# Patient Record
Sex: Female | Born: 1935 | Race: White | Hispanic: No | Smoking: Former smoker
Health system: Southern US, Community
[De-identification: ages and names within clinical notes are randomized; demographics above are authoritative.]

## PROBLEM LIST (undated history)

## (undated) DIAGNOSIS — C801 Malignant (primary) neoplasm, unspecified: Secondary | ICD-10-CM

## (undated) DIAGNOSIS — M810 Age-related osteoporosis without current pathological fracture: Secondary | ICD-10-CM

## (undated) DIAGNOSIS — E0500 Thyrotoxicosis with diffuse goiter without thyrotoxic crisis or storm: Secondary | ICD-10-CM

## (undated) DIAGNOSIS — I10 Essential (primary) hypertension: Secondary | ICD-10-CM

## (undated) DIAGNOSIS — E079 Disorder of thyroid, unspecified: Secondary | ICD-10-CM

## (undated) HISTORY — DX: Malignant (primary) neoplasm, unspecified: C80.1

## (undated) HISTORY — PX: BACK SURGERY: SHX140

## (undated) HISTORY — PX: HAND SURGERY: SHX662

## (undated) HISTORY — PX: BREAST LUMPECTOMY: SHX2

## (undated) HISTORY — PX: ROTATOR CUFF REPAIR: SHX139

## (undated) HISTORY — PX: ELBOW SURGERY: SHX618

## (undated) HISTORY — PX: JOINT REPLACEMENT: SHX530

---

## 2005-05-08 ENCOUNTER — Ambulatory Visit: Payer: Self-pay | Admitting: Specialist

## 2005-05-22 ENCOUNTER — Inpatient Hospital Stay (HOSPITAL_COMMUNITY): Admission: RE | Admit: 2005-05-22 | Discharge: 2005-05-26 | Payer: Self-pay | Admitting: Neurosurgery

## 2005-07-23 ENCOUNTER — Encounter: Admission: RE | Admit: 2005-07-23 | Discharge: 2005-07-23 | Payer: Self-pay | Admitting: Neurosurgery

## 2006-02-27 ENCOUNTER — Inpatient Hospital Stay: Payer: Self-pay | Admitting: Specialist

## 2006-03-06 ENCOUNTER — Encounter: Payer: Self-pay | Admitting: Internal Medicine

## 2006-03-08 ENCOUNTER — Encounter: Payer: Self-pay | Admitting: Internal Medicine

## 2006-03-26 ENCOUNTER — Encounter: Payer: Self-pay | Admitting: Internal Medicine

## 2006-04-08 ENCOUNTER — Encounter: Payer: Self-pay | Admitting: Internal Medicine

## 2006-05-08 ENCOUNTER — Encounter: Payer: Self-pay | Admitting: Internal Medicine

## 2007-06-08 ENCOUNTER — Other Ambulatory Visit: Payer: Self-pay

## 2007-06-08 ENCOUNTER — Inpatient Hospital Stay: Payer: Self-pay | Admitting: Internal Medicine

## 2009-10-17 ENCOUNTER — Emergency Department: Payer: Self-pay | Admitting: Unknown Physician Specialty

## 2010-09-07 DIAGNOSIS — M48062 Spinal stenosis, lumbar region with neurogenic claudication: Secondary | ICD-10-CM | POA: Insufficient documentation

## 2011-06-20 ENCOUNTER — Emergency Department (HOSPITAL_COMMUNITY)
Admission: EM | Admit: 2011-06-20 | Discharge: 2011-06-20 | Disposition: A | Discharge status: Home / Self Care | Payer: Medicare Other | Attending: Emergency Medicine | Admitting: Emergency Medicine

## 2011-06-20 ENCOUNTER — Emergency Department (HOSPITAL_COMMUNITY): Payer: Medicare Other

## 2011-06-20 ENCOUNTER — Encounter (HOSPITAL_COMMUNITY): Payer: Self-pay | Admitting: Emergency Medicine

## 2011-06-20 DIAGNOSIS — Z79899 Other long term (current) drug therapy: Secondary | ICD-10-CM | POA: Insufficient documentation

## 2011-06-20 DIAGNOSIS — R209 Unspecified disturbances of skin sensation: Secondary | ICD-10-CM | POA: Insufficient documentation

## 2011-06-20 DIAGNOSIS — M51369 Other intervertebral disc degeneration, lumbar region without mention of lumbar back pain or lower extremity pain: Secondary | ICD-10-CM

## 2011-06-20 DIAGNOSIS — M48061 Spinal stenosis, lumbar region without neurogenic claudication: Secondary | ICD-10-CM

## 2011-06-20 DIAGNOSIS — M810 Age-related osteoporosis without current pathological fracture: Secondary | ICD-10-CM | POA: Insufficient documentation

## 2011-06-20 DIAGNOSIS — M545 Low back pain, unspecified: Secondary | ICD-10-CM

## 2011-06-20 DIAGNOSIS — E079 Disorder of thyroid, unspecified: Secondary | ICD-10-CM | POA: Insufficient documentation

## 2011-06-20 DIAGNOSIS — I10 Essential (primary) hypertension: Secondary | ICD-10-CM | POA: Insufficient documentation

## 2011-06-20 DIAGNOSIS — M5126 Other intervertebral disc displacement, lumbar region: Secondary | ICD-10-CM

## 2011-06-20 HISTORY — DX: Thyrotoxicosis with diffuse goiter without thyrotoxic crisis or storm: E05.00

## 2011-06-20 HISTORY — DX: Essential (primary) hypertension: I10

## 2011-06-20 HISTORY — DX: Age-related osteoporosis without current pathological fracture: M81.0

## 2011-06-20 HISTORY — DX: Disorder of thyroid, unspecified: E07.9

## 2011-06-20 MED ORDER — KETOROLAC TROMETHAMINE 30 MG/ML IJ SOLN
30.0000 mg | Freq: Once | INTRAMUSCULAR | Status: AC
  Administered 2011-06-20: 30 mg via INTRAVENOUS
  Filled 2011-06-20: qty 1

## 2011-06-20 MED ORDER — DIAZEPAM 5 MG PO TABS
5.0000 mg | ORAL_TABLET | Freq: Once | ORAL | Status: AC
  Administered 2011-06-20: 5 mg via ORAL
  Filled 2011-06-20: qty 1

## 2011-06-20 MED ORDER — HYDROMORPHONE HCL PF 1 MG/ML IJ SOLN
0.5000 mg | Freq: Once | INTRAMUSCULAR | Status: AC
  Administered 2011-06-20: 0.5 mg via INTRAVENOUS
  Filled 2011-06-20: qty 1

## 2011-06-20 MED ORDER — PERCOCET 5-325 MG PO TABS: 1.0000 | ORAL_TABLET | ORAL | Status: AC | PRN

## 2011-06-20 MED ORDER — HYDROMORPHONE HCL PF 1 MG/ML IJ SOLN
1.0000 mg | Freq: Once | INTRAMUSCULAR | Status: AC
  Administered 2011-06-20: 1 mg via INTRAVENOUS
  Filled 2011-06-20: qty 1

## 2011-06-20 MED ORDER — DIAZEPAM 5 MG PO TABS: 5.0000 mg | ORAL_TABLET | Freq: Three times a day (TID) | ORAL | Status: AC | PRN

## 2011-06-20 MED ORDER — ONDANSETRON HCL 4 MG/2ML IJ SOLN
4.0000 mg | Freq: Once | INTRAMUSCULAR | Status: AC
  Administered 2011-06-20: 4 mg via INTRAVENOUS
  Filled 2011-06-20: qty 2

## 2011-06-20 NOTE — ED Provider Notes (Signed)
I was the primary provider of this patient during this ER visit. The patients care was continued in the CDU and managed in conjunction with my midlevel providers   [name], MD 06/20/11 2018

## 2011-06-20 NOTE — ED Notes (Signed)
Pt reports, "I hurt my back." Pt reports hurt her back by bending over 2 weeks ago. Pt c/o numbness to b/l legs. Denies incontinence of bowel or urine.

## 2011-06-20 NOTE — ED Provider Notes (Signed)
4:41 PM Pt placed in CDU holding for MRI L-spine. Sign out received from Dr Patria Mane.  Pt  Patient reports her back pain is much improved, now just an "ache."  Discussed MRI results with patient and patient's sister.  Patient has been taking OTC pain medications at home without improvement.  Has taken oxycodone at home with relief previously - states hydrocodone does not work for her.  Plan is to ambulate in hallway, d/c home if tolerating.  Pt has cane here, also has walker at home if needed.  Will also d/c with valium.    6:26 PM Patient ambulated in hallway with some pain but comfortable enough to go home.  She used her cane here but has her walker to use at home.  Pt to be d/c home with f/u with Dr Jordan Likes, neurosurgery, with percocet, valium.  Return precautions given.  Pt advised to be cautious with pain medications given fall risk. Patient verbalizes understanding and agrees with plan.     No results found for this or any previous visit. Mr Lumbar Spine Wo Contrast  06/20/2011  *RADIOLOGY REPORT*  Clinical Data: Low back pain and bilateral leg pain.  Numbness in the left foot.  The left leg weakness.  Previous lumbar surgeries.  MRI LUMBAR SPINE WITHOUT CONTRAST  Technique:  Multiplanar and multiecho pulse sequences of the lumbar spine were obtained without intravenous contrast.  Comparison: Lumbar radiographs dated 07/23/2005  Findings: Scan extends from T11-12 through S3.  Tip of the conus is at L2-3 and appears normal.  There is a tiny bulge of the mid abdominal aorta to a maximal diameter of 2.5 cm.  Paraspinal soft tissues are otherwise normal.  T11-12 through L1-2:  There is disc space narrowing with small broad-based disc bulges without neural impingement or significant spinal stenosis.  L2-3:  There is a small broad-based disc bulge with accompanying osteophyte formation.  Hypertrophied ligamenta flava and facet joints compress the lateral recesses and the sides of the thecal sac causing mild spinal  stenosis.  There is also right foraminal stenosis.  L3-4:  Solid interbody fusion with posterior decompression with no residual impingement.  Slight scarring around the right side of the thecal sac and the right L3 nerve root sleeve.  L4-5:  The disc is normal.  There is marked hypertrophy of the ligamenta flava and facet joints with posterior decompression surgery.  Moderate bilateral foraminal stenosis and compression of the right and left sides of the thecal sac.  No focal neural impingement.  L5-S1:  There is a central annular tear and a tiny central subligamentous disc protrusion with bulging of the remainder of the disc without focal neural impingement.  Moderate hypertrophy of the ligamenta flava and facet joints.  There has been posterior decompression.  IMPRESSION:  1.  No acute abnormalities of the lumbar spine. 2.  Narrowing of the transverse dimension of the spinal canal L2-3, L4-5 and L5 S1 due to hypertrophy of the facet joints. 3.  Posterior decompression surgery from L2-3 through L5-S1 with no residual impingement. 3.  Solid fusion at L3-4 with slight scarring around the right side of the thecal sac and the right L3 nerve root sleeve.  Original Report Authenticated By: Gwynn Burly, M.D.      Dillard Cannon [place], [place] 06/20/11 1914

## 2011-06-20 NOTE — ED Provider Notes (Signed)
History  Scribed for Cynthia Co, MD, the patient was seen in room STRE2/STRE2. This chart was scribed by Cynthia Raymond. The patient's care started at 1:07 PM   CSN: [id number]  Arrival date & time 06/20/11  1244   First MD Initiated Contact with Patient 06/20/11 1257      Chief Complaint  Patient presents with  . Back Injury     The history is provided by the patient.   Cynthia Raymond is a 77 y.o. female who presents to the Emergency Department complaining of back pain that started about two weeks ago after experiencing sharp jabbing pains while bent over, squatting, planting flowers.  The back pain got worse soon after radiating down both legs.  She states that for several days she experienced numbness of the left foot.  She reports that the pain briefly subsided, but has since gotten worse after changing the sheets on the bed about one week ago.  She has had difficulty moving and has used a cane.  She has taken ibuprofen tylenol with no relief.  She denies trouble with urination or dysuria.  She states that the left leg feels weak.  Pt has h/o spinal stenosis and has had two surgeries with the last surgery five to six years ago.  She has foot drop and previous knee surgery of the left leg.    Past Medical History  Diagnosis Date  . Hypertension   . Thyroid disease   . Graves disease   . Osteoporosis     Past Surgical History  Procedure Date  . Breast lumpectomy     History reviewed. No pertinent family history.  History  Substance Use Topics  . Smoking status: Former Games developer  . Smokeless tobacco: Not on file   Comment: Quit 1989  . Alcohol Use: No    OB History    Grav Para Term Preterm Abortions TAB SAB Ect Mult Living                  Review of Systems  All other systems reviewed and are negative.    Allergies  Amitriptyline; Penicillins; and Sulfa antibiotics  Home Medications   Current Outpatient Rx  Name Route Sig Dispense Refill  .  AMLODIPINE BESYLATE 5 MG PO TABS Oral Take 5 mg by mouth daily.    Marland Kitchen VITAMIN D PO Oral Take 2 capsules by mouth daily.    Marland Kitchen HYDROCHLOROTHIAZIDE 25 MG PO TABS Oral Take 25 mg by mouth daily.    Marland Kitchen RAMIPRIL 10 MG PO CAPS Oral Take 10 mg by mouth daily.    . SERTRALINE HCL 50 MG PO TABS Oral Take 100 mg by mouth daily.      BP 138/71  Pulse 71  Temp 98 F (36.7 C) (Oral)  Resp 18  SpO2 94%  Physical Exam  Nursing note and vitals reviewed. Constitutional: She is oriented to person, place, and time. She appears well-developed and well-nourished. No distress.  HENT:  Head: Normocephalic and atraumatic.  Eyes: EOM are normal.  Neck: Neck supple. No tracheal deviation present.  Cardiovascular: Normal rate.   Pulmonary/Chest: Effort normal. No respiratory distress.  Abdominal: There is no tenderness.  Musculoskeletal: Normal range of motion.       Good pulses in both feet.  Upper and lower L-spine tenderness.  Left lumbar spinal tenderness.  Mild weakness in left foot with plantar flexion, significant weakness in other major muscles of left leg.   Neurological: She is alert  and oriented to person, place, and time.  Skin: Skin is warm and dry.  Psychiatric: She has a normal mood and affect. Her behavior is normal.    ED Course  Procedures  DIAGNOSTIC STUDIES: Oxygen Saturation is 94% on room air, normal by my interpretation.    COORDINATION OF CARE:  1:19PM Ordered: MR Lumbar Spine Wo Contrast   Labs Reviewed - No data to display No results found.   No diagnosis found.    MDM  The patient has had worsening low back pain and prior L. spine surgery by Dr. pool.  Given her complaint of numbness in her legs we will move on to MRI imaging at this time.  The patient's care will be continued in the clinical decision unit  I personally performed the services described in this documentation, which was scribed in my presence. The recorded information has been reviewed and  considered.            Cynthia Co, MD 06/20/11 [phone]

## 2011-06-20 NOTE — Discharge Instructions (Signed)
Read the information below.  Please call Dr Jordan Likes for a close follow up appointment.  If you develop weakness or numbness of your legs, loss of control of your bowel or bladder, fevers, or you are unable to walk at home, return to the ER immediately for a recheck.  Please keep in mind that the pain medications prescribed to you may may you drowsy and therefore more likely to fall.  Please use caution when using these medications.  If you continue to have pain while using these, you may add and antiinflammatory medication such as ibuprofen or Aleve, but please do not take additional tylenol as this is already in Percocet and may put you at risk of overdose.  You may return to the ER at any time for worsening condition or any new symptoms that concern you.  Back Pain, Adult Low back pain is very common. About 1 in 5 people have back pain.The cause of low back pain is rarely dangerous. The pain often gets better over time.About half of people with a sudden onset of back pain feel better in just 2 weeks. About 8 in 10 people feel better by 6 weeks.  CAUSES Some common causes of back pain include:  Strain of the muscles or ligaments supporting the spine.   Wear and tear (degeneration) of the spinal discs.   Arthritis.   Direct injury to the back.  DIAGNOSIS Most of the time, the direct cause of low back pain is not known.However, back pain can be treated effectively even when the exact cause of the pain is unknown.Answering your caregiver's questions about your overall health and symptoms is one of the most accurate ways to make sure the cause of your pain is not dangerous. If your caregiver needs more information, he or she may order lab work or imaging tests (X-rays or MRIs).However, even if imaging tests show changes in your back, this usually does not require surgery. HOME CARE INSTRUCTIONS For many people, back pain returns.Since low back pain is rarely dangerous, it is often a condition that  people can learn to [facility] their own.   Remain active. It is stressful on the back to sit or stand in one place. Do not sit, drive, or stand in one place for more than 30 minutes at a time. Take short walks on level surfaces as soon as pain allows.Try to increase the length of time you walk each day.   Do not stay in bed.Resting more than 1 or 2 days can delay your recovery.   Do not avoid exercise or work.Your body is made to move.It is not dangerous to be active, even though your back may hurt.Your back will likely heal faster if you return to being active before your pain is gone.   Pay attention to your body when you bend and lift. Many people have less discomfortwhen lifting if they bend their knees, keep the load close to their bodies,and avoid twisting. Often, the most comfortable positions are those that put less stress on your recovering back.   Find a comfortable position to sleep. Use a firm mattress and lie on your side with your knees slightly bent. If you lie on your back, put a pillow under your knees.   Only take over-the-counter or prescription medicines as directed by your caregiver. Over-the-counter medicines to reduce pain and inflammation are often the most helpful.Your caregiver may prescribe muscle relaxant drugs.These medicines help dull your pain so you can more quickly return to your  normal activities and healthy exercise.   Put ice on the injured area.   Put ice in a plastic bag.   Place a towel between your skin and the bag.   Leave the ice on for 15 to 20 minutes, 3 to 4 times a day for the first 2 to 3 days. After that, ice and heat may be alternated to reduce pain and spasms.   Ask your caregiver about trying back exercises and gentle massage. This may be of some benefit.   Avoid feeling anxious or stressed.Stress increases muscle tension and can worsen back pain.It is important to recognize when you are anxious or stressed and learn ways to  manage it.Exercise is a great option.  SEEK MEDICAL CARE IF:  You have pain that is not relieved with rest or medicine.   You have pain that does not improve in 1 week.   You have new symptoms.   You are generally not feeling well.  SEEK IMMEDIATE MEDICAL CARE IF:   You have pain that radiates from your back into your legs.   You develop new bowel or bladder control problems.   You have unusual weakness or numbness in your arms or legs.   You develop nausea or vomiting.   You develop abdominal pain.   You feel faint.  Document Released: 12/24/2004 Document Revised: 12/13/2010 Document Reviewed: 05/14/2010 [facility] Patient Information 2012 [place], [place].

## 2011-06-20 NOTE — ED Notes (Signed)
PT AMBULATED IN THE HALL WITH CANE. TOLERATED WITH A FEW GRUNTS BUT WELL. PA DID OBSERVE AMBULATION

## 2011-08-14 ENCOUNTER — Emergency Department: Payer: Self-pay | Admitting: Emergency Medicine

## 2011-09-12 ENCOUNTER — Other Ambulatory Visit: Payer: Self-pay | Admitting: Neurosurgery

## 2011-09-12 DIAGNOSIS — M545 Low back pain, unspecified: Secondary | ICD-10-CM

## 2011-09-12 DIAGNOSIS — M542 Cervicalgia: Secondary | ICD-10-CM

## 2011-09-20 ENCOUNTER — Ambulatory Visit
Admission: RE | Admit: 2011-09-20 | Discharge: 2011-09-20 | Disposition: A | Discharge status: Home / Self Care | Payer: Medicare Other | Source: Ambulatory Visit | Attending: Neurosurgery | Admitting: Neurosurgery

## 2011-09-20 DIAGNOSIS — M542 Cervicalgia: Secondary | ICD-10-CM

## 2011-09-20 DIAGNOSIS — M545 Low back pain, unspecified: Secondary | ICD-10-CM

## 2011-09-20 MED ORDER — GADOBENATE DIMEGLUMINE 529 MG/ML IV SOLN
18.0000 mL | Freq: Once | INTRAVENOUS | Status: AC | PRN
  Administered 2011-09-20: 18 mL via INTRAVENOUS

## 2011-12-12 ENCOUNTER — Other Ambulatory Visit: Payer: Self-pay | Admitting: Neurosurgery

## 2011-12-12 DIAGNOSIS — M5412 Radiculopathy, cervical region: Secondary | ICD-10-CM

## 2011-12-12 DIAGNOSIS — M48061 Spinal stenosis, lumbar region without neurogenic claudication: Secondary | ICD-10-CM

## 2011-12-24 ENCOUNTER — Other Ambulatory Visit: Payer: Medicare Other

## 2012-03-16 ENCOUNTER — Emergency Department: Payer: Self-pay | Admitting: Emergency Medicine

## 2012-03-16 LAB — COMPREHENSIVE METABOLIC PANEL
Albumin: 3.5 g/dL (ref 3.4–5.0)
Anion Gap: 7 (ref 7–16)
BUN: 13 mg/dL (ref 7–18)
Chloride: 104 mmol/L (ref 98–107)
Co2: 28 mmol/L (ref 21–32)
Creatinine: 0.79 mg/dL (ref 0.60–1.30)
EGFR (Non-African Amer.): >60
Glucose: 89 mg/dL (ref 65–99)
Potassium: 3.5 mmol/L (ref 3.5–5.1)
SGOT(AST): 21 U/L (ref 15–37)
SGPT (ALT): 21 U/L (ref 12–78)
Total Protein: 7.7 g/dL (ref 6.4–8.2)

## 2012-03-16 LAB — TROPONIN I: Troponin-I: <0.02 ng/mL

## 2012-03-16 LAB — CBC
HGB: 13.9 g/dL (ref 12.0–16.0)
MCHC: 34 g/dL (ref 32.0–36.0)
RBC: 4.37 10*6/uL (ref 3.80–5.20)
RDW: 13 % (ref 11.5–14.5)
WBC: 7.1 10*3/uL (ref 3.6–11.0)

## 2012-08-14 ENCOUNTER — Emergency Department: Payer: Self-pay | Admitting: Emergency Medicine

## 2012-08-21 ENCOUNTER — Emergency Department: Payer: Self-pay | Admitting: Emergency Medicine

## 2012-09-30 ENCOUNTER — Ambulatory Visit: Payer: Self-pay | Admitting: Neurosurgery

## 2013-01-21 ENCOUNTER — Ambulatory Visit: Payer: Self-pay | Admitting: Anesthesiology

## 2013-01-22 ENCOUNTER — Other Ambulatory Visit: Payer: Self-pay | Admitting: Neurosurgery

## 2013-01-22 ENCOUNTER — Ambulatory Visit
Admission: RE | Admit: 2013-01-22 | Discharge: 2013-01-22 | Disposition: A | Discharge status: Home / Self Care | Payer: PRIVATE HEALTH INSURANCE | Source: Ambulatory Visit | Attending: Neurosurgery | Admitting: Neurosurgery

## 2013-01-22 DIAGNOSIS — M545 Low back pain, unspecified: Secondary | ICD-10-CM

## 2013-01-22 MED ORDER — GADOBENATE DIMEGLUMINE 529 MG/ML IV SOLN
20.0000 mL | Freq: Once | INTRAVENOUS | Status: AC | PRN
  Administered 2013-01-22: 20 mL via INTRAVENOUS

## 2013-01-28 ENCOUNTER — Encounter (HOSPITAL_COMMUNITY): Payer: Self-pay | Admitting: Emergency Medicine

## 2013-01-28 ENCOUNTER — Emergency Department (HOSPITAL_COMMUNITY): Payer: Medicare Other

## 2013-01-28 ENCOUNTER — Emergency Department (HOSPITAL_COMMUNITY)
Admission: EM | Admit: 2013-01-28 | Discharge: 2013-01-28 | Disposition: A | Discharge status: Home / Self Care | Payer: Medicare Other | Attending: Emergency Medicine | Admitting: Emergency Medicine

## 2013-01-28 DIAGNOSIS — R1030 Lower abdominal pain, unspecified: Secondary | ICD-10-CM

## 2013-01-28 DIAGNOSIS — Z87891 Personal history of nicotine dependence: Secondary | ICD-10-CM | POA: Insufficient documentation

## 2013-01-28 DIAGNOSIS — E079 Disorder of thyroid, unspecified: Secondary | ICD-10-CM | POA: Insufficient documentation

## 2013-01-28 DIAGNOSIS — Z880 Allergy status to penicillin: Secondary | ICD-10-CM | POA: Insufficient documentation

## 2013-01-28 DIAGNOSIS — Z8739 Personal history of other diseases of the musculoskeletal system and connective tissue: Secondary | ICD-10-CM | POA: Insufficient documentation

## 2013-01-28 DIAGNOSIS — Z7982 Long term (current) use of aspirin: Secondary | ICD-10-CM | POA: Insufficient documentation

## 2013-01-28 DIAGNOSIS — Z79899 Other long term (current) drug therapy: Secondary | ICD-10-CM | POA: Insufficient documentation

## 2013-01-28 DIAGNOSIS — Z91040 Latex allergy status: Secondary | ICD-10-CM | POA: Insufficient documentation

## 2013-01-28 DIAGNOSIS — I10 Essential (primary) hypertension: Secondary | ICD-10-CM | POA: Insufficient documentation

## 2013-01-28 DIAGNOSIS — R109 Unspecified abdominal pain: Secondary | ICD-10-CM | POA: Insufficient documentation

## 2013-01-28 LAB — CBC WITH DIFFERENTIAL/PLATELET
BASOS ABS: 0 10*3/uL (ref 0.0–0.1)
Basophils Relative: 0 % (ref 0–1)
Eosinophils Absolute: 0.1 10*3/uL (ref 0.0–0.7)
Eosinophils Relative: 2 % (ref 0–5)
HCT: 38.9 % (ref 36.0–46.0)
Hemoglobin: 13.3 g/dL (ref 12.0–15.0)
LYMPHS ABS: 1.5 10*3/uL (ref 0.7–4.0)
LYMPHS PCT: 19 % (ref 12–46)
MCH: 31.9 pg (ref 26.0–34.0)
MCHC: 34.2 g/dL (ref 30.0–36.0)
MCV: 93.3 fL (ref 78.0–100.0)
Monocytes Absolute: 1 10*3/uL (ref 0.1–1.0)
Monocytes Relative: 12 % (ref 3–12)
Neutro Abs: 5.5 10*3/uL (ref 1.7–7.7)
Neutrophils Relative %: 68 % (ref 43–77)
PLATELETS: 285 10*3/uL (ref 150–400)
RBC: 4.17 MIL/uL (ref 3.87–5.11)
RDW: 13.3 % (ref 11.5–15.5)
WBC: 8.1 10*3/uL (ref 4.0–10.5)

## 2013-01-28 LAB — COMPREHENSIVE METABOLIC PANEL
ALK PHOS: 71 U/L (ref 39–117)
ALT: 21 U/L (ref 0–35)
AST: 23 U/L (ref 0–37)
Albumin: 3.5 g/dL (ref 3.5–5.2)
BUN: 15 mg/dL (ref 6–23)
CO2: 26 meq/L (ref 19–32)
Calcium: 9.2 mg/dL (ref 8.4–10.5)
Chloride: 92 mEq/L — ABNORMAL LOW (ref 96–112)
Creatinine, Ser: 0.72 mg/dL (ref 0.50–1.10)
GFR calc non Af Amer: 78 mL/min — ABNORMAL LOW (ref >90)
GLUCOSE: 96 mg/dL (ref 70–99)
POTASSIUM: 4.5 meq/L (ref 3.7–5.3)
Sodium: 134 mEq/L — ABNORMAL LOW (ref 137–147)
Total Bilirubin: 0.4 mg/dL (ref 0.3–1.2)
Total Protein: 7.2 g/dL (ref 6.0–8.3)

## 2013-01-28 LAB — URINALYSIS, ROUTINE W REFLEX MICROSCOPIC
BILIRUBIN URINE: NEGATIVE
Glucose, UA: NEGATIVE mg/dL
HGB URINE DIPSTICK: NEGATIVE
KETONES UR: NEGATIVE mg/dL
NITRITE: NEGATIVE
PH: 7.5 (ref 5.0–8.0)
Protein, ur: NEGATIVE mg/dL
Specific Gravity, Urine: 1.016 (ref 1.005–1.030)
Urobilinogen, UA: 0.2 mg/dL (ref 0.0–1.0)

## 2013-01-28 LAB — URINE MICROSCOPIC-ADD ON

## 2013-01-28 MED ORDER — OXYCODONE-ACETAMINOPHEN 5-325 MG PO TABS: 1.0000 | ORAL_TABLET | ORAL | Status: DC | PRN

## 2013-01-28 MED ORDER — HYDROMORPHONE HCL PF 1 MG/ML IJ SOLN
1.0000 mg | Freq: Once | INTRAMUSCULAR | Status: AC
  Administered 2013-01-28: 1 mg via INTRAVENOUS
  Filled 2013-01-28: qty 1

## 2013-01-28 MED ORDER — IOHEXOL 300 MG/ML  SOLN
25.0000 mL | Freq: Once | INTRAMUSCULAR | Status: AC | PRN
Start: 2013-01-28 — End: 2013-01-28
  Administered 2013-01-28: 25 mL via ORAL

## 2013-01-28 MED ORDER — IOHEXOL 300 MG/ML  SOLN
100.0000 mL | Freq: Once | INTRAMUSCULAR | Status: AC | PRN
  Administered 2013-01-28: 100 mL via INTRAVENOUS

## 2013-01-28 NOTE — ED Provider Notes (Signed)
CSN: [id number]     Arrival date & time 01/28/13  1653 History   First MD Initiated Contact with Patient 01/28/13 1904     Chief Complaint  Patient presents with  . Groin Pain    The history is provided by the patient and a relative.  pt reports onset of right groin pain over a month ago that is worsening Rest improves her symptoms, palpation and movement worsens her pain She denies fever/vomiting/diarrhea No falls reported No dysuria No constipation  She reports this pain has been managed by her pain specialist who initially thought that this was related to low back pain and had recent lumbar MRI but did not show any acute pathology She reports due to the pain she is unable to fully range the right hip She reports she can ambulate with a canse   Past Medical History  Diagnosis Date  . Hypertension   . Thyroid disease   . Graves disease   . Osteoporosis    Past Surgical History  Procedure Laterality Date  . Breast lumpectomy     History reviewed. No pertinent family history. History  Substance Use Topics  . Smoking status: Former Research scientist (life sciences)  . Smokeless tobacco: Not on file     Comment: Quit 1989  . Alcohol Use: No   OB History   Grav Para Term Preterm Abortions TAB SAB Ect Mult Living                 Review of Systems  Constitutional: Negative for fever.  Respiratory: Negative for shortness of breath.   Cardiovascular: Negative for chest pain.  Gastrointestinal: Positive for abdominal pain. Negative for vomiting and constipation.  Genitourinary: Negative for dysuria.  All other systems reviewed and are negative.    Allergies  Penicillins; Amitriptyline; Latex; and Sulfa antibiotics  Home Medications   Current Outpatient Rx  Name  Route  Sig  Dispense  Refill  . amLODipine (NORVASC) 5 MG tablet   Oral   Take 5 mg by mouth daily.         Marland Kitchen aspirin 81 MG tablet   Oral   Take 81 mg by mouth daily.         Marland Kitchen atenolol (TENORMIN) 25 MG tablet   Oral    Take 25 mg by mouth daily.         . Cholecalciferol (VITAMIN D PO)   Oral   Take 2 capsules by mouth daily.         . Cyanocobalamin (VITAMIN B 12 PO)   Oral   Take 1 tablet by mouth daily.         . hydrochlorothiazide (HYDRODIURIL) 25 MG tablet   Oral   Take 25 mg by mouth daily.         Marland Kitchen HYDROcodone-acetaminophen (NORCO/VICODIN) 5-325 MG per tablet   Oral   Take 1 tablet by mouth every 6 (six) hours as needed for moderate pain.         Marland Kitchen levothyroxine (SYNTHROID, LEVOTHROID) 150 MCG tablet   Oral   Take 150 mcg by mouth daily before breakfast.         . ramipril (ALTACE) 10 MG capsule   Oral   Take 10 mg by mouth daily.         . sertraline (ZOLOFT) 50 MG tablet   Oral   Take 100 mg by mouth daily.         . vitamin C (ASCORBIC ACID) 500 MG tablet  Oral   Take 500 mg by mouth daily.         Marland Kitchen zolpidem (AMBIEN) 10 MG tablet   Oral   Take 10 mg by mouth at bedtime as needed for sleep.          BP 143/63  Pulse 62  Temp(Src) 97.9 F (36.6 C) (Oral)  Resp 20  Ht 5\' 2"  (1.575 m)  Wt 200 lb (90.719 kg)  BMI 36.57 kg/m2  SpO2 95% Physical Exam CONSTITUTIONAL: Well developed/well nourished HEAD: Normocephalic/atraumatic EYES: EOMI/PERRL ENMT: Mucous membranes moist NECK: supple no meningeal signs CV: S1/S2 noted, no murmurs/rubs/gallops noted LUNGS: Lungs are clear to auscultation bilaterally, no apparent distress ABDOMEN: soft, moderate tenderness to palpation in RLQ and right inguinal region but no large hernia is appreciated, no rebound or guarding DP:OEUMPN appearance NEURO: Pt is awake/alert, moves all extremitiesx4. She is able to move toes/right foot but is unable to flex right hip due to pain EXTREMITIES: pulses normal/equal in LE, full ROM.  Tenderness with ROM of right hip, but no edema to lower extremities, no warmth over right hip and distal pulses equal/intact SKIN: warm, color normal PSYCH: no abnormalities of mood  noted  ED Course  Procedures   8:21 PM Pt here for evaluation for right groin pain for over a month.  Initial concern by pain management was for lumbar radiculopathy but MRI unrevealing, he now has concern for abdominal process or even potentially a hernia Given her focal tenderness will obtain Ct abd/pelvis 11:23 PM Ct abd/pelvis negative for acute pathology I called radiology on call to review imaging and there is no obvious bony pathology Her ROM of right hip is limited due to pain.  However after 2nd dose of IV dilaudid she feels improved and can range the hip.  She is ambulatory and can bear weight.  She still reports some pain (though improved) and I did offer observation admission if she felt uncomfortable at home but she insists on going home.  She would like something else for pain, percocet ordered and told to stop her vicodin.  I gave her referral for outpatient orthopedics Labs Review Labs Reviewed  COMPREHENSIVE METABOLIC PANEL - Abnormal; Notable for the following:    Sodium 134 (*)    Chloride 92 (*)    GFR calc non Af Amer 78 (*)    All other components within normal limits  CBC WITH DIFFERENTIAL  URINALYSIS, ROUTINE W REFLEX MICROSCOPIC   Imaging Review No results found.  EKG Interpretation   None       MDM  No diagnosis found. Nursing notes including past medical history and social history reviewed and considered in documentation Labs/vital reviewed and considered Previous records reviewed and considered - recent MRI results reviewed     Sharyon Cable, MD 01/28/13 2326

## 2013-01-28 NOTE — ED Notes (Signed)
CT informed pt done with contrast  

## 2013-01-28 NOTE — ED Notes (Signed)
Pt informed of need for urine sample, pt states she just went before they called her back.

## 2013-01-28 NOTE — ED Notes (Signed)
Patient is requesting another Happy Meal and drink.

## 2013-01-28 NOTE — ED Provider Notes (Signed)
I also informed pt/daughter of incidental findings of aorta, and advised monitoring as outpatient and may need ultrasound imaging to monitor and may need vascular followup  Sharyon Cable, MD 01/28/13 2336

## 2013-01-28 NOTE — Discharge Instructions (Signed)
PLEASE RETURN FOR ANY WORSENED PAIN, IF YOU ARE UNABLE TO WALK, IF YOU HAVE WORSENED WEAKNESS TO THE LEG, OR IF YOU HAVE ANY WORSENED ABDOMINAL PAIN PLEASE DO NOT TAKE PERCOCET WITH YOUR VICODIN AT HOME YOU CAN FOLLOWUP WITH ORTHOPEDICS IF YOUR PAIN IS NOT IMPROVED OVER THE NEXT WEEK  PLEASE FOLLOWUP FOR OUTPATIENT ULTRASOUND OF YOUR AORTA WITH YOUR PHYSICIAN OR THE PHYSICIAN LISTED FOR VASCULAR SURGERY

## 2013-01-28 NOTE — ED Notes (Signed)
Pt is being treated by pain management md for back and leg pain. Today the pain management doctor told the pt to come to ED to R/O a hernia because the pain is becoming unmanageable and her groin was tender on palpation in office today. Shes been taking vicodin with minimal relief.

## 2013-02-08 ENCOUNTER — Other Ambulatory Visit: Payer: Self-pay

## 2013-02-08 DIAGNOSIS — I739 Peripheral vascular disease, unspecified: Secondary | ICD-10-CM

## 2013-02-08 DIAGNOSIS — L98499 Non-pressure chronic ulcer of skin of other sites with unspecified severity: Secondary | ICD-10-CM

## 2013-02-11 ENCOUNTER — Encounter: Payer: Self-pay | Admitting: Vascular Surgery

## 2013-02-12 ENCOUNTER — Telehealth: Payer: Self-pay | Admitting: Vascular Surgery

## 2013-02-12 ENCOUNTER — Ambulatory Visit (INDEPENDENT_AMBULATORY_CARE_PROVIDER_SITE_OTHER): Payer: Medicare Other | Admitting: Vascular Surgery

## 2013-02-12 ENCOUNTER — Encounter: Payer: Self-pay | Admitting: Vascular Surgery

## 2013-02-12 ENCOUNTER — Ambulatory Visit (HOSPITAL_COMMUNITY)
Admission: RE | Admit: 2013-02-12 | Discharge: 2013-02-12 | Disposition: A | Discharge status: Home / Self Care | Payer: Medicare Other | Source: Ambulatory Visit | Attending: Vascular Surgery | Admitting: Vascular Surgery

## 2013-02-12 VITALS — BP 129/77 | HR 63 | Ht 62.0 in | Wt 206.3 lb

## 2013-02-12 DIAGNOSIS — Z87891 Personal history of nicotine dependence: Secondary | ICD-10-CM | POA: Insufficient documentation

## 2013-02-12 DIAGNOSIS — R29898 Other symptoms and signs involving the musculoskeletal system: Secondary | ICD-10-CM | POA: Insufficient documentation

## 2013-02-12 DIAGNOSIS — L98499 Non-pressure chronic ulcer of skin of other sites with unspecified severity: Secondary | ICD-10-CM

## 2013-02-12 DIAGNOSIS — Z9010 Acquired absence of unspecified breast and nipple: Secondary | ICD-10-CM | POA: Insufficient documentation

## 2013-02-12 DIAGNOSIS — I739 Peripheral vascular disease, unspecified: Secondary | ICD-10-CM

## 2013-02-12 DIAGNOSIS — R1031 Right lower quadrant pain: Secondary | ICD-10-CM

## 2013-02-12 DIAGNOSIS — I700 Atherosclerosis of aorta: Secondary | ICD-10-CM | POA: Insufficient documentation

## 2013-02-12 DIAGNOSIS — I10 Essential (primary) hypertension: Secondary | ICD-10-CM | POA: Insufficient documentation

## 2013-02-12 DIAGNOSIS — G8929 Other chronic pain: Secondary | ICD-10-CM | POA: Insufficient documentation

## 2013-02-12 NOTE — Addendum Note (Signed)
Addended by: Dorthula Rue L on: 02/12/2013 03:00 PM   Modules accepted: Orders

## 2013-02-12 NOTE — Progress Notes (Signed)
[facility]  Referred by:  ED MD  Reason for referral: aortic atherosclerosis  History of Present Illness  Cynthia Raymond is a 80 y.o. ([birth year]) female with chronic back pain who presents with chief complaint: right lower quadrant pain.  This patient is under chronic pain management for cervical spinal issues.  She developed persistent RLQ pain since Nov 2014, so she was referred to ED for MRI L-spine.  She had that study completed which did not demonstrate any HNP.  She also had an ABD/Pelvis CT to screen for hernias, which only found tiny hernias.  Aortic atherosclerosis was found an result in this referral.  The patient has no intermittent claudication or rest pain.  She have never had ulcers or gangrene.  She had never had sx consistent with chronic mesenteric ischemia.  Her sx currently are persistent RLQ pain not associated with other sx, right leg weakness and right arm weakness due to prior shoulder injury.  She denies any stroke or TIA sx.  Her atherosclerotic risk factors included: HTN and former smoking.   Past Medical History  Diagnosis Date  . Hypertension   . Thyroid disease   . Graves disease   . Osteoporosis   . Cancer     breast    Past Surgical History  Procedure Laterality Date  . Breast lumpectomy    . Joint replacement Bilateral   . Back surgery      X3  . Rotator cuff repair Bilateral   . Elbow surgery Right   . Hand surgery Right     History   Social History  . Marital Status: Married    Spouse Name: N/A    Number of Children: N/A  . Years of Education: N/A   Occupational History  . Not on file.   Social History Main Topics  . Smoking status: Former Smoker    Quit date: 01/08/1987  . Smokeless tobacco: Not on file     Comment: Quit 1989  . Alcohol Use: No  . Drug Use: No  . Sexual Activity: Not on file   Other Topics Concern  . Not on file   Social History Narrative  . No narrative on file    Family  History  Problem Relation Age of Onset  . Cancer Mother   . Heart attack Father   . Cancer Sister   . Heart disease Sister   . Hyperlipidemia Sister   . Heart attack Sister   . Cancer Brother   . Cancer Daughter      Current Outpatient Prescriptions on File Prior to Visit  Medication Sig Dispense Refill  . amLODipine (NORVASC) 5 MG tablet Take 5 mg by mouth daily.      Marland Kitchen aspirin 81 MG tablet Take 81 mg by mouth daily.      Marland Kitchen atenolol (TENORMIN) 25 MG tablet Take 25 mg by mouth daily.      . Cholecalciferol (VITAMIN D PO) Take 2 capsules by mouth daily.      . Cyanocobalamin (VITAMIN B 12 PO) Take 1 tablet by mouth daily.      . hydrochlorothiazide (HYDRODIURIL) 25 MG tablet Take 25 mg by mouth daily.      Marland Kitchen HYDROcodone-acetaminophen (NORCO/VICODIN) 5-325 MG per tablet Take 1 tablet by mouth every 6 (six) hours as needed for moderate pain.      Marland Kitchen levothyroxine (SYNTHROID, LEVOTHROID) 150 MCG tablet Take 150 mcg by mouth daily before breakfast.      .  oxyCODONE-acetaminophen (PERCOCET/ROXICET) 5-325 MG per tablet Take 1 tablet by mouth every 4 (four) hours as needed for severe pain.  15 tablet  0  . ramipril (ALTACE) 10 MG capsule Take 10 mg by mouth daily.      . sertraline (ZOLOFT) 50 MG tablet Take 100 mg by mouth daily.      . vitamin C (ASCORBIC ACID) 500 MG tablet Take 500 mg by mouth daily.      Marland Kitchen zolpidem (AMBIEN) 10 MG tablet Take 10 mg by mouth at bedtime as needed for sleep.       No current facility-administered medications on file prior to visit.    Allergies  Allergen Reactions  . Penicillins Anaphylaxis and Shortness Of Breath  . Amitriptyline Other (See Comments)    Yeast infection   . Latex   . Sulfa Antibiotics Other (See Comments)    Tongue and lip swelling    REVIEW OF SYSTEMS:  (Positives checked otherwise negative)  CARDIOVASCULAR:  []  chest pain, []  chest pressure, []  palpitations, []  shortness of breath when laying flat, []  shortness of breath with  exertion,  []  pain in feet when walking, []  pain in feet when laying flat, []  history of blood clot in veins (DVT), []  history of phlebitis, []  swelling in legs, []  varicose veins  PULMONARY:  []  productive cough, []  asthma, []  wheezing  NEUROLOGIC:  [x]  weakness in arms or legs, []  numbness in arms or legs, []  difficulty speaking or slurred speech, []  temporary loss of vision in one eye, []  dizziness  HEMATOLOGIC:  []  bleeding problems, []  problems with blood clotting too easily  MUSCULOSKEL:  []  joint pain, []  joint swelling  GASTROINTEST:  []  vomiting blood, []  blood in stool, [x]  abd pain   GENITOURINARY:  []  burning with urination, []  blood in urine  PSYCHIATRIC:  []  history of major depression  INTEGUMENTARY:  []  rashes, []  ulcers  CONSTITUTIONAL:  []  fever, []  chills  For VQI Use Only  PRE-ADM LIVING: Home  AMB STATUS: Ambulatory  CAD Sx: None  PRIOR CHF: None  STRESS TEST: [x]  No, [ ]  Normal, [ ]  + ischemia, [ ]  + MI, [ ]  Both  Physical Examination  Filed Vitals:   02/12/13 1031  BP: 129/77  Pulse: 63  Height: 5\' 2"  (1.575 m)  Weight: 206 lb 4.8 oz (93.577 kg)  SpO2: 94%    Body mass index is 37.72 kg/(m^2).  General: A&O x 3, WD, Obese,   Head: [place]/AT,   Ear/Nose/Throat: Hearing grossly intact, nares w/o erythema or drainage, oropharynx w/o Erythema/Exudate, Mallampati score: 3  Eyes: PERRLA, some difficulty with EOMI testing  Neck: Supple, no nuchal rigidity, no palpable LAD  Pulmonary: Sym exp, good air movt, CTAB, no rales, rhonchi, & wheezing  Cardiac: RRR, Nl S1, S2, no Murmurs, rubs or gallops  Vascular: Vessel Right Left  Radial Palpable Palpable  Brachial Palpable Palpable  Carotid Palpable, without bruit Palpable, without bruit  Aorta Not palpable N/A  Femoral Palpable Palpable  Popliteal Not palpable Not palpable  PT Palpable Palpable  DP Palpable Palpable   Gastrointestinal: soft, ND, -G/R, - HSM, - masses, - CVAT B, RLQ TTP, no  palpable hernias  Musculoskeletal: LUE and LLE M/S 5/5, RUE 3/5, RLE 1-2/5, Extremities without ischemic changes   Neurologic: CN 2-12 intact , Pain and light touch intact in extremities , Motor exam as listed above  Psychiatric: Judgment intact, Mood & affect appropriate for pt's clinical situation  Dermatologic: See M/S  exam for extremity exam, no rashes otherwise noted  Lymph : No Cervical, Axillary, or Inguinal lymphadenopathy   Non-Invasive Vascular Imaging  ABI (Date: 02/12/2013)  RLE: 1.21, PT: tri, DP: tri, TBI: 0.90  LLE: 1.13, PT: tri, DP: tri, TBI: 0.69  CT Abd/Pelvis (01/28/13) 1. No acute process in the abdomen or pelvis.  2. Non aneurysmal infrarenal aortic dilatation.  Based on my review of the CT, this patient has diffuse calcific atherosclerosis in her aorta, mesenteric arteries, and iliac arteries and common femoral arteries. There is not evidence of stenosis >50% throughout.  Medical Decision Making  KIMIA FINAN is a 80 y.o. female who presents with: calcific atherosclerosis without clinical significant, RLQ pain of unknown etiology, RLE weakness, RUE weakness, primary care coordination   Despite CT findings, there is no evidence that this patient's atherosclerosis is clinically significant.  She does not need further vascular surgical follow up.  I would be more concerned with the RLQ pain and neurologic deficits in her RLE and RUE.  Additionally, this patient is 80 years old without any Primary Care Physician established.  I have referred her to a PCP to coordinate all of her care.  I have also arranged for a Neurology consultation to evaluate the neurologic deficits present in this patient.  A Head MRI was ordered to evaluate for possible CVA in this patient.  I discussed in depth with the patient the nature of atherosclerosis, and emphasized the importance of maximal medical management including strict control of blood pressure, blood glucose, and  lipid levels, antiplatelet agents, obtaining regular exercise, and cessation of smoking.    The patient is aware that without maximal medical management the underlying atherosclerotic disease process will progress, limiting the benefit of any interventions. The patient is currently not on a statin.  Once she had a PCP, her lipid profile will need to be obtained. The patient is currently on an anti-platelet: ASA.  Thank you for allowing [place] to participate in this patient's care.  Adele Barthel, MD [facility] Office: [phone] Pager: [phone]  02/12/2013, 11:53 AM

## 2013-02-12 NOTE — Telephone Encounter (Signed)
Gave patient the following information:  MRI - at [facility] 10:30 am 02/19/13 [phone] (Pt is severely claustrophobic) PCP - Dr. Laqu[facility] - 02/19/13 2:15 pm bring ins card & pic ID [phone] Neuro will be calling pt to set appt - [facility] - [phone]

## 2013-02-12 NOTE — Addendum Note (Signed)
Addended by: Dorthula Rue L on: 02/12/2013 03:29 PM   Modules accepted: Orders

## 2013-02-23 ENCOUNTER — Ambulatory Visit: Payer: Medicare Other | Admitting: Neurology

## 2013-03-04 ENCOUNTER — Ambulatory Visit: Payer: Self-pay | Admitting: Neurology

## 2013-03-10 ENCOUNTER — Encounter: Payer: Self-pay | Admitting: Vascular Surgery

## 2013-03-12 ENCOUNTER — Ambulatory Visit (INDEPENDENT_AMBULATORY_CARE_PROVIDER_SITE_OTHER): Payer: Medicare Other | Admitting: Neurology

## 2013-03-12 ENCOUNTER — Encounter: Payer: Self-pay | Admitting: Neurology

## 2013-03-12 VITALS — BP 128/72 | HR 59 | Ht 63.0 in | Wt 207.0 lb

## 2013-03-12 DIAGNOSIS — R2681 Unsteadiness on feet: Secondary | ICD-10-CM

## 2013-03-12 DIAGNOSIS — G8929 Other chronic pain: Secondary | ICD-10-CM

## 2013-03-12 DIAGNOSIS — R269 Unspecified abnormalities of gait and mobility: Secondary | ICD-10-CM

## 2013-03-12 DIAGNOSIS — L98499 Non-pressure chronic ulcer of skin of other sites with unspecified severity: Secondary | ICD-10-CM

## 2013-03-12 DIAGNOSIS — I739 Peripheral vascular disease, unspecified: Secondary | ICD-10-CM

## 2013-03-12 NOTE — Patient Instructions (Signed)
Overall you are doing fairly well but I do want to suggest a few things today:   Remember to drink plenty of fluid, eat healthy meals and do not skip any meals. Try to eat protein with a every meal and eat a healthy snack such as fruit or nuts in between meals. Try to keep a regular sleep-wake schedule and try to exercise daily, particularly in the form of walking, 20-30 minutes a day, if you can.   I would like you to work with physical therapy, they will help with your walking  As far as diagnostic testing:  1)Please have a follow up head CT completed  Please call us with any interim questions, concerns, problems, updates or refill requests.   Please also call us for any test results so we can go over those with you on the phone.  My clinical assistant and will answer any of your questions and relay your messages to me and also relay most of my messages to you.   Our phone number is [phone]. We also have an after hours call service for urgent matters and there is a physician on-call for urgent questions. For any emergencies you know to call 911 or go to the nearest emergency room

## 2013-03-12 NOTE — Progress Notes (Signed)
[facility]    Provider:  Dr Hosie PoissonSumner Referring Provider: No ref. provider found Primary Care Physician:  PROVIDER NOT IN SYSTEM  CC:  Weakness   HPI:  Cynthia Raymond is a 80 y.o. female here as a referral from Dr. Imogene Burnhen for right sided weakness.  Patient referred after [facility] consult noticed right sided weakness. Patient had brain MRI overall unremarkable with no explanation for weakness. Patient currently reports having no difficulty with her RUE or RLE. States that she received an injection and this has resolved the pain in her right groin. Reports she did some heavy lifting in October 2014 and has been weak since then.   Has had 3 lumbar back surgeries in the past. Has received cortisol injections in the cervical regions in the past. Notes continued joint pain in her right hip.   Consult notes from Dr Imogene Burnhen: Cynthia DemarkShirley A Vahey is a 80 y.o. (Sep 08, 1929) female with chronic back pain who presents with chief complaint: right lower quadrant pain. This patient is under chronic pain management for cervical spinal issues. She developed persistent RLQ pain since Nov 2014, so she was referred to ED for MRI L-spine. She had that study completed which did not demonstrate any HNP. She also had an ABD/Pelvis CT to screen for hernias, which only found tiny hernias. Aortic atherosclerosis was found an result in this referral. The patient has no intermittent claudication or rest pain. She have never had ulcers or gangrene. She had never had sx consistent with chronic mesenteric ischemia. Her sx currently are persistent RLQ pain not associated with other sx, right leg weakness and right arm weakness due to prior shoulder injury. She denies any stroke or TIA sx. Her atherosclerotic risk factors included: HTN and former smoking.   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   Review of Systems: Out of a complete 14 system review, the patient complains of  only the following symptoms, and all other reviewed systems are negative. + headache  History   Social History  . Marital Status: Married    Spouse Name: Earle    Number of Children: 4  . Years of Education: 12   Occupational History  . Not on file.   Social History Main Topics  . Smoking status: Former Smoker    Quit date: 01/08/1987  . Smokeless tobacco: Not on file     Comment: Quit 1989  . Alcohol Use: No  . Drug Use: No  . Sexual Activity: Not on file   Other Topics Concern  . Not on file   Social History Narrative   Patient is married to CulverEarle, has 4 children, 1 deceased   Patient is right handed   Education level is 12   Caffeine consumption is 2 cups daily          Family History  Problem Relation Age of Onset  . Cancer Mother   . Heart attack Father   . Cancer Sister   . Heart disease Sister   . Hyperlipidemia Sister   . Heart attack Sister   . Cancer Brother   . Cancer Daughter     Past Medical History  Diagnosis Date  . Hypertension   . Thyroid disease   . Graves disease   . Osteoporosis   . Cancer     breast    Past Surgical History  Procedure Laterality Date  . Breast lumpectomy    . Joint replacement Bilateral   .  Back surgery      X3  . Rotator cuff repair Bilateral   . Elbow surgery Right   . Hand surgery Right     Current Outpatient Prescriptions  Medication Sig Dispense Refill  . amLODipine (NORVASC) 5 MG tablet Take 5 mg by mouth daily.      Marland Kitchen aspirin 81 MG tablet Take 81 mg by mouth daily.      Marland Kitchen atenolol (TENORMIN) 25 MG tablet Take 25 mg by mouth daily.      . Cholecalciferol (VITAMIN D PO) Take 2 capsules by mouth daily.      . Cyanocobalamin (VITAMIN B 12 PO) Take 1 tablet by mouth daily.      . hydrochlorothiazide (HYDRODIURIL) 25 MG tablet Take 25 mg by mouth daily.      Marland Kitchen levothyroxine (SYNTHROID, LEVOTHROID) 150 MCG tablet Take 150 mcg by mouth daily before breakfast.      . ramipril (ALTACE) 10 MG capsule Take  10 mg by mouth daily.      . sertraline (ZOLOFT) 50 MG tablet Take 100 mg by mouth daily.      . vitamin C (ASCORBIC ACID) 500 MG tablet Take 500 mg by mouth daily.      Marland Kitchen zolpidem (AMBIEN) 10 MG tablet Take 10 mg by mouth at bedtime as needed for sleep.       No current facility-administered medications for this visit.    Allergies as of 03/12/2013 - Review Complete 03/12/2013  Allergen Reaction Noted  . Penicillins Anaphylaxis and Shortness Of Breath 06/20/2011  . Amitriptyline Other (See Comments) 06/20/2011  . Latex  01/28/2013  . Sulfa antibiotics Other (See Comments) 06/20/2011    Vitals: BP 128/72  Pulse 59  Ht 5\' 3"  (1.6 m)  Wt 207 lb (93.895 kg)  BMI 36.68 kg/m2 Last Weight:  Wt Readings from Last 1 Encounters:  03/12/13 207 lb (93.895 kg)   Last Height:   Ht Readings from Last 1 Encounters:  03/12/13 5\' 3"  (1.6 m)     Physical exam: Exam: Gen: NAD, conversant Eyes: anicteric sclerae, moist conjunctivae HENT: Atraumatic, oropharynx clear Neck: Trachea midline; supple,  Lungs: CTA, no wheezing, rales, rhonic                          CV: RRR, no MRG Abdomen: Soft, non-tender;  Extremities: No peripheral edema  Skin: Normal temperature, no rash,  Psych: Appropriate affect, pleasant  Neuro: MS: AA&Ox3, appropriately interactive, normal affect   Speech: fluent w/o paraphasic error  Memory: good recent and remote recall  CN: PERRL, EOMI no nystagmus, no ptosis, sensation intact to LT V1-V3 bilat, face symmetric, no weakness, hearing grossly intact, palate elevates symmetrically, shoulder shrug 5/5 bilat,  tongue protrudes midline, no fasiculations noted.  Motor: limited ROM of bilateral shoulders R>L Refused motor exam due to pain but prior to refusal strength seemed 5/5 with giveaway weakness due to pain throughout  Reflexes: refused due to pain  Sens: LT intact in all extremities  Gait: stands slowly without assistance, slightly wide based, antalgic  favoring R leg, unsteady   Assessment:  After physical and neurologic examination, review of laboratory studies, imaging, neurophysiology testing and pre-existing records, assessment will be reviewed on the problem list.  Plan:  Treatment plan and additional workup will be reviewed under Problem List.  1)Chronic pain 2)Gait instability  80y/o woman with history of chronic pain and right sided weakness presenting for initial evaluation. Had brain  MRI done showing no central cause for weakness. Suspect weakness is likely pain related. Patient will benefit from PT and possible aquatic therapy. Discussed option with patient who wishes to hold off on aquatic therapy but will try PT. Will order head CT to check clivus growth per brain MRI results. Follow up as needed.   Jim Like, DO  [facility] [address] [place], [place] [address]  Phone [phone] Fax [phone]

## 2013-06-18 DIAGNOSIS — G8929 Other chronic pain: Secondary | ICD-10-CM | POA: Insufficient documentation

## 2013-10-22 ENCOUNTER — Other Ambulatory Visit: Payer: Self-pay

## 2013-11-25 ENCOUNTER — Ambulatory Visit: Payer: Self-pay | Admitting: Family Medicine

## 2014-01-14 ENCOUNTER — Inpatient Hospital Stay: Payer: Self-pay | Admitting: Surgery

## 2014-01-14 LAB — COMPREHENSIVE METABOLIC PANEL
ALK PHOS: 84 U/L
ALT: 26 U/L
AST: 30 U/L (ref 15–37)
Albumin: 3.7 g/dL (ref 3.4–5.0)
Anion Gap: 9 (ref 7–16)
BILIRUBIN TOTAL: 0.9 mg/dL (ref 0.2–1.0)
BUN: 10 mg/dL (ref 7–18)
CHLORIDE: 102 mmol/L (ref 98–107)
Calcium, Total: 9.3 mg/dL (ref 8.5–10.1)
Co2: 29 mmol/L (ref 21–32)
Creatinine: 1 mg/dL (ref 0.60–1.30)
EGFR (African American): >60
GFR CALC NON AF AMER: 56 — AB
Glucose: 101 mg/dL — ABNORMAL HIGH (ref 65–99)
OSMOLALITY: 279 (ref 275–301)
Potassium: 3.5 mmol/L (ref 3.5–5.1)
Sodium: 140 mmol/L (ref 136–145)
Total Protein: 7.9 g/dL (ref 6.4–8.2)

## 2014-01-14 LAB — CBC
HCT: 41.6 % (ref 35.0–47.0)
HGB: 13.7 g/dL (ref 12.0–16.0)
MCH: 30.6 pg (ref 26.0–34.0)
MCHC: 33 g/dL (ref 32.0–36.0)
MCV: 93 fL (ref 80–100)
PLATELETS: 270 10*3/uL (ref 150–440)
RBC: 4.48 10*6/uL (ref 3.80–5.20)
RDW: 14.3 % (ref 11.5–14.5)
WBC: 8 10*3/uL (ref 3.6–11.0)

## 2014-01-14 LAB — URINALYSIS, COMPLETE
BILIRUBIN, UR: NEGATIVE
BLOOD: NEGATIVE
Bacteria: NONE SEEN
Glucose,UR: NEGATIVE mg/dL (ref 0–75)
Ketone: NEGATIVE
Leukocyte Esterase: NEGATIVE
NITRITE: NEGATIVE
PH: 9 (ref 4.5–8.0)
Protein: NEGATIVE
Specific Gravity: 1.029 (ref 1.003–1.030)
WBC UR: 3 /HPF (ref 0–5)

## 2014-01-14 LAB — TROPONIN I

## 2014-01-14 LAB — LIPASE, BLOOD: Lipase: 118 U/L (ref 73–393)

## 2014-01-15 LAB — CBC WITH DIFFERENTIAL/PLATELET
Basophil #: 0.1 10*3/uL (ref 0.0–0.1)
Basophil %: 1.1 %
EOS PCT: 3.8 %
Eosinophil #: 0.2 10*3/uL (ref 0.0–0.7)
HCT: 37.4 % (ref 35.0–47.0)
HGB: 12 g/dL (ref 12.0–16.0)
LYMPHS ABS: 2 10*3/uL (ref 1.0–3.6)
LYMPHS PCT: 34.1 %
MCH: 30.9 pg (ref 26.0–34.0)
MCHC: 32 g/dL (ref 32.0–36.0)
MCV: 97 fL (ref 80–100)
Monocyte #: 0.7 x10 3/mm (ref 0.2–0.9)
Monocyte %: 11.3 %
Neutrophil #: 3 10*3/uL (ref 1.4–6.5)
Neutrophil %: 49.7 %
Platelet: 201 10*3/uL (ref 150–440)
RBC: 3.87 10*6/uL (ref 3.80–5.20)
RDW: 14.7 % — ABNORMAL HIGH (ref 11.5–14.5)
WBC: 5.9 10*3/uL (ref 3.6–11.0)

## 2014-01-15 LAB — COMPREHENSIVE METABOLIC PANEL
Albumin: 3 g/dL — ABNORMAL LOW (ref 3.4–5.0)
Alkaline Phosphatase: 67 U/L
Anion Gap: 7 (ref 7–16)
BILIRUBIN TOTAL: 0.7 mg/dL (ref 0.2–1.0)
BUN: 9 mg/dL (ref 7–18)
CHLORIDE: 109 mmol/L — AB (ref 98–107)
CREATININE: 0.95 mg/dL (ref 0.60–1.30)
Calcium, Total: 8.1 mg/dL — ABNORMAL LOW (ref 8.5–10.1)
Co2: 26 mmol/L (ref 21–32)
EGFR (Non-African Amer.): 60 — ABNORMAL LOW
Glucose: 93 mg/dL (ref 65–99)
Osmolality: 282 (ref 275–301)
POTASSIUM: 3.8 mmol/L (ref 3.5–5.1)
SGOT(AST): 21 U/L (ref 15–37)
SGPT (ALT): 22 U/L
Sodium: 142 mmol/L (ref 136–145)
TOTAL PROTEIN: 6.5 g/dL (ref 6.4–8.2)

## 2014-01-15 LAB — PROTIME-INR
INR: 1.2
PROTHROMBIN TIME: 14.7 s (ref 11.5–14.7)

## 2014-01-15 LAB — AMYLASE: AMYLASE: 63 U/L (ref 25–115)

## 2014-01-15 LAB — MAGNESIUM: MAGNESIUM: 1.9 mg/dL

## 2014-01-15 LAB — APTT: Activated PTT: 30 secs (ref 23.6–35.9)

## 2014-01-18 DIAGNOSIS — I517 Cardiomegaly: Secondary | ICD-10-CM

## 2014-01-18 LAB — CBC WITH DIFFERENTIAL/PLATELET
BASOS ABS: 0 10*3/uL (ref 0.0–0.1)
Basophil %: 0.5 %
EOS ABS: 0.2 10*3/uL (ref 0.0–0.7)
EOS PCT: 2.9 %
HCT: 32.1 % — AB (ref 35.0–47.0)
HGB: 10.5 g/dL — ABNORMAL LOW (ref 12.0–16.0)
LYMPHS PCT: 23.2 %
Lymphocyte #: 1.5 10*3/uL (ref 1.0–3.6)
MCH: 31.3 pg (ref 26.0–34.0)
MCHC: 32.6 g/dL (ref 32.0–36.0)
MCV: 96 fL (ref 80–100)
Monocyte #: 0.7 x10 3/mm (ref 0.2–0.9)
Monocyte %: 11.1 %
NEUTROS ABS: 4.1 10*3/uL (ref 1.4–6.5)
Neutrophil %: 62.3 %
Platelet: 153 10*3/uL (ref 150–440)
RBC: 3.35 10*6/uL — AB (ref 3.80–5.20)
RDW: 14.4 % (ref 11.5–14.5)
WBC: 6.5 10*3/uL (ref 3.6–11.0)

## 2014-01-18 LAB — BASIC METABOLIC PANEL
Anion Gap: 6 — ABNORMAL LOW (ref 7–16)
BUN: 6 mg/dL — ABNORMAL LOW (ref 7–18)
CHLORIDE: 112 mmol/L — AB (ref 98–107)
CO2: 25 mmol/L (ref 21–32)
CREATININE: 0.94 mg/dL (ref 0.60–1.30)
Calcium, Total: 7.9 mg/dL — ABNORMAL LOW (ref 8.5–10.1)
Glucose: 104 mg/dL — ABNORMAL HIGH (ref 65–99)
OSMOLALITY: 283 (ref 275–301)
Potassium: 3.8 mmol/L (ref 3.5–5.1)
SODIUM: 143 mmol/L (ref 136–145)

## 2014-01-19 LAB — HEMOGLOBIN: HGB: 10.2 g/dL — AB (ref 12.0–16.0)

## 2014-01-27 ENCOUNTER — Ambulatory Visit: Payer: Self-pay | Admitting: Specialist

## 2014-04-22 ENCOUNTER — Other Ambulatory Visit: Payer: Self-pay | Admitting: Specialist

## 2014-04-22 DIAGNOSIS — R918 Other nonspecific abnormal finding of lung field: Secondary | ICD-10-CM

## 2014-05-02 LAB — SURGICAL PATHOLOGY

## 2014-05-08 NOTE — Consult Note (Signed)
Pt seen and examined. Please see C.London's notes. Pt admitted with SBO. Overall improved but epigastric pain persists. Reasonable to proceed with EGD tomorrow to check for PUD. Has COPD but breathing stable now. NPO after MN for EGD tomorrow. Thanks.  Electronic Signatures: Verdie Shire (MD)  (Signed on 13-Jan-16 15:02)  Authored  Last Updated: 13-Jan-16 15:02 by Verdie Shire (MD)

## 2014-05-08 NOTE — Discharge Summary (Signed)
PATIENT NAME:  Cynthia Raymond, Cynthia Raymond MR#:  [id number] DATE OF BIRTH:  [birth year]  DATE OF ADMISSION:  01/14/2014 DATE OF DISCHARGE:  01/21/2014  BRIEF HISTORY: Ms. [name] is an 81 year old woman admitted with what appeared to be a partial small bowel obstruction. She presented to the Emergency Room with symptoms of crampy abdominal discomfort with mild epigastric and lower quadrant pain. She has had small loose bowel movements, but appeared to have evidence on workup in the Emergency Room with dilated loops of small bowel in the left upper and mid abdomen consistent with possible small bowel obstruction. She was admitted to the hospital, placed on nasogastric decompression. She initially did quite well, and then began to have more symptoms by the 11th. She continued to have significant amounts of nasogastric drainage. Because of her persistent pain, we felt that she may require a laparotomy. She began to improve over the next 48 hours. Repeated her CT scan on the evening of the 12th, which demonstrated no obstruction, no other inflammatory issues. She had some mild mid epigastric pain. We had Dr. Verdie Shire of the GI service see her and he recommended upper endoscopy. Upper endoscopy did demonstrate some gastritis, which he felt may be responsible for her upper abdominal symptoms. She had some mild disorientation in the hospital and became concerned that the nurses were not interested in her care. She and her daughter suggested she be discharged home. We felt that she would require further evaluation but at the patient's and family's request we discharged her on the 15th.   DISCHARGE MEDICATIONS: Include amlodipine 5 mg once a day, hydrochlorothiazide 25 mg once a day, ramipril 10 mg once a day, tramadol 50 mg every 6 hours p.r.n., trazodone 50 mg at bedtime, levothyroxine 150 mcg once a day, omeprazole 20 mg once a day.   FOLLOWUP: She will follow up with her primary care physician.   FINAL DISCHARGE  DIAGNOSIS: Partial small bowel obstruction.   ____________________________ Rodena Goldmann III, MD rle:TT D: 01/31/2014 22:16:00 ET T: 01/31/2014 22:29:29 ET JOB#: [id number]  cc: Micheline Maze, MD, <Dictator> [place]era [place], MD Rodena Goldmann MD ELECTRONICALLY SIGNED 02/02/2014 22:01

## 2014-05-08 NOTE — Consult Note (Signed)
PATIENT NAME:  Cynthia Raymond, Cynthia Raymond MR#:  [id number] DATE OF BIRTH:  04/11/1929  DATE OF CONSULTATION:  01/19/2014  REFERRING PHYSICIAN:   Dia Crawford, MD CONSULTING PHYSICIAN:  Theodore Demark, NP   REASON FOR CONSULTATION: To evaluate for persistent upper abdominal pain.   HISTORY OF PRESENT ILLNESS:  I appreciate consult for an 81 year old Caucasian woman with history of chronic obstructive pulmonary disease, admitted for small bowel obstruction that resolved with medical management for evaluation of left upper quadrant and epigastric pain. Please see prior notes relating to her small bowel obstruction, as dictated by her primary care team, surgical team.  Today, her NG tube is out and she says she is overall feeling much better but has some persistent epigastric pain, sharp at times, dull at others, worse with manual pressure. Unable to say what makes it better, denies dyspepsia, nausea or vomiting, reports a very large orange-brown, loose stool, movement this a.m.  with this.  Denies NSAID use at home. Denies GI family history. Does report a history of chronic abdominal pain treated at 1 time with sertraline by her primary care provider without benefit so stopped this.  She says she has an upcoming GI evaluation with Dr. Candace Cruise as an outpatient. Has been on omeprazole 20 mg p.o. daily at home. Remote history of EGD, unknown results. Last colonoscopy was 10 years ago. States history of polyps.   PAST MEDICAL HISTORY: Left breast cancer with lumpectomy in remission since 2001. Arthritis, hypothyroidism, hypertension, left total hip replacement, bilateral shoulder replacements, right TKR, left TKR, thyroidectomy, cholecystectomy, COPD, history of Graves' disease surgically treated.   ALLERGIES: MORPHINE, PENICILLIN, SULFA, LATEX.   MEDICATIONS: Omeprazole 20 mg p.o. daily, amlodipine 5 mg p.o. daily, atenolol 25 mg p.o. daily, hydrochlorothiazide 25 mg p.o. daily, levothyroxine 150 mcg p.o. daily,  ramipril 10 mg p.o. daily, Spiriva 18 mcg p.o. daily, Tramadol 50 mg p.o. daily, trazodone 50 mg p.o. at bedtime.   FAMILY HISTORY: No history of colorectal cancer, colon polyps, liver disease, ulcers, abdominal motility disorders.   SOCIAL HISTORY: No tobacco, illicits, alcohol. Lives at her own home.   REVIEW OF SYSTEMS:  Ten systems reviewed. Significant for intermittent headaches, dry mouth, mild chronic shortness of breath, wears oxygen about 14 hours a day at home. Does state she has a "spot on her right lung being followed by Dr. Raul Del, history also of joint pain related to arthritis and anxiety, frustration regarding recent health issues. Otherwise unremarkable other than what is written above, follows with Dr. Buel Ream for her primary care.   LABORATORY DATA: Most recent labs: Glucose 104, BUN 6, creatinine 0.94, sodium 143, potassium 3.8, chloride 112, GFR greater than 60, calcium 7.9, magnesium 1.9, amylase 63, lipase 118, total protein 6.5, albumin 3.0, total bilirubin 0.7, ALP 67, AST 21, ALT 22. WBC 6.5, hemoglobin 10.2, hematocrit 32.1, normocytic cells. PT 14.7, INR 1.2.   PHYSICAL EXAMINATION:  VITAL SIGNS: Most recent vital signs: Temperature 99.1, pulse 61, respiratory rate 18, blood pressure 154/74, SaO2 94% on room air.  GENERAL: A chronically ill-appearing woman in no acute distress, resting comfortably in bed.  HEENT: Normocephalic, atraumatic. Conjunctivae pink. Does have a small scabby area on the upper and lower lip, borders are benign.  NECK: Supple. No JVD, thyromegaly, lymphadenopathy.  RESPIRATORY: Respirations eupneic. Lungs clear.  CARDIAC: S1, S2, RRR. No MRG. Trace generalized edema.  ABDOMEN: Bowel sounds x 4. Nondistended. Minimal epigastric tenderness, very soft. No guarding, rigidity, rebound tenderness, peritoneal signs, hepatosplenomegaly or  masses.  EXTREMITIES: No clubbing or cyanosis. Strength 4/5.  SKIN: Warm, dry, pink. Scattered ecchymosis. No  erythema or rash.  NEUROLOGICAL: Alert, oriented x 3. Cranial nerves II through XII intact.  PSYCHIATRIC: Good insight, logical train of thought, pleasant, cooperative.   IMPRESSION AND PLAN: Epigastric pain, left upper quadrant pain, persistent, has risk factors of hospitalization and instrumentation in the stomach as risk factors for peptic ulcer disease. Recommend esophagogastroduodenoscopy for luminal evaluation as clinically feasible. Indications, benefits, risks including, but not limited to, bleeding, infection, perforation, difficulty with sedation were discussed with the patient and she is agreeable to the procedure. I have discussed this with Dr. Candace Cruise and we will plan on doing this tomorrow as able.  She should also keep her outpatient appointment with Dr. Candace Cruise for her chronic abdominal issues.   Thank you very much for this consult. These services were provided by Stephens November, MSN, [facility], in collaboration with Dr. Verdie Shire, MD, with whom I have discussed this patient in full.     ____________________________ Theodore Demark, NP chl:DT D: 01/19/2014 17:27:26 ET T: 01/19/2014 17:59:36 ET JOB#: [id number]  cc: Theodore Demark, NP, <Dictator> [place] SIGNED 02/21/2014 15:09

## 2014-05-08 NOTE — H&P (Signed)
History of Present Illness 77 yof who began experiencing initially crampy and then constant RLQ and epigastric abdominal pain last night, such that she was unable to sleep. This AM she started vomiting after her coffee and she vomited all day long. For the last 3 weeks, she has been having daily small, loose but not watery BMs. Today she had 3 small loose BMs. She typically passes flatus, but she cannot remember whether she did today or not.   Past History PMH- insomnia  ROS -  No hematemesis, hematochezia, melena, fever, chills. No h/o anything like this before.   Past Med/Surgical Hx:  left breast ca with  lumpectomy. pt in remission since 2001:   arthritis:   hypothyroid:   htn:   left total hip replacement:   shoulder surgery bilat:   rt tkr:   lt tkr 02/27/06:   thyroidectomy:   cholecystectomy:   ALLERGIES:  PCN: Resp. Distress  Sulfa drugs: Swelling  Latex: Hives  HOME MEDICATIONS: Medication Instructions Status  Norco 325 mg-5 mg oral tablet 1 tab(s) orally every 8 hours as needed for pain Active  chlorpheniramine-HYDROcodone 8 mg-10 mg/5 mL suspension, extended release 5 mL orally every 12 hours x 7 days Active  Azithromycin 5 Day Dose Pack 2 tab(s) orally once a day x 1 day then 1 tab orally once a day x 4 days Active  amlodipine 5 mg oral tablet 1 tab(s) orally once a day  Active  hydrochlorothiazide 25 mg oral tablet 1 tab(s) orally once a day  Active  ramipril 10 mg oral capsule 1 cap(s) orally once a day Active  sertraline 50 mg oral tablet 1 tab(s) orally once a day Active  levothyroxine 150 mcg (0.15 mg) oral tablet 1 tab(s) orally once a day Active  Vitamin D3 3000 unit(s) orally once a day Active  Afrin 0.05% nasal spray 1 spray(s) nasal 2 times a day Active  Tessalon Perles 100 mg oral capsule 1-2 cap(s) orally every 8 hours, As Needed for cough Active  zolpidem 5 mg oral tablet 1 tab(s) orally once a day (at bedtime), As Needed Active   Family and Social  History:  Family History Negative  no family h/o boowel motility d/os   Social History negative tobacco, negative ETOH, negative Illicit drugs   Place of Living Home   Review of Systems:  Fever/Chills No   Cough No   Sputum No   Abdominal Pain Yes   Diarrhea No   Constipation No   Nausea/Vomiting Yes   SOB/DOE No   Chest Pain No   Dysuria No   Tolerating PT Yes   Tolerating Diet No  Vomiting   Medications/Allergies Reviewed Medications/Allergies reviewed   Physical Exam:  GEN well developed, well nourished, no acute distress   HEENT pink conjunctivae, PERRL, hearing intact to voice, moist oral mucosa, Oropharynx clear   NECK supple  No masses  trachea midline   RESP normal resp effort  clear BS  no use of accessory muscles   CARD regular rate  no murmur  no thrills  no JVD  no Rub   ABD positive tenderness  no distention. no rebound or guarding. moderately tympanitic.   EXTR negative cyanosis/clubbing, negative edema   SKIN normal to palpation, No rashes, skin turgor good   NEURO cranial nerves intact, negative tremor, follows commands, motor/sensory function intact   PSYCH alert, A+O to time, place, person, good insight   Lab Results: Hepatic:  08-Jan-16 17:08   Bilirubin,  Total 0.9  Alkaline Phosphatase 84 (46-116 NOTE: New Reference Range 07/27/13)  SGPT (ALT) 26 (14-63 NOTE: New Reference Range 07/27/13)  SGOT (AST) 30  Total Protein, Serum 7.9  Albumin, Serum 3.7  Routine Chem:  08-Jan-16 17:08   Glucose, Serum  101  BUN 10  Creatinine (comp) 1.00  Sodium, Serum 140  Potassium, Serum 3.5  Chloride, Serum 102  CO2, Serum 29  Calcium (Total), Serum 9.3  Osmolality (calc) 279  eGFR (African American) >60  eGFR (Non-African American)  56 (eGFR values <3m/min/1.73 m2 may be an indication of chronic kidney disease (CKD). Calculated eGFR, using the MRDR Study equation, is useful in  patients with stable renal function. The eGFR  calculation will not be reliable in acutely ill patients when serum creatinine is changing rapidly. It is not useful in patients on dialysis. The eGFR calculation may not be applicable to patients at the low and high extremes of body sizes, pregnant women, and vegetarians.)  Anion Gap 9  Lipase 118 (Result(s) reported on 14 Jan 2014 at 05:40PM.)  Cardiac:  08-Jan-16 17:08   Troponin I < 0.02 (0.00-0.05 0.05 ng/mL or less: NEGATIVE  Repeat testing in 3-6 hrs  if clinically indicated. >0.05 ng/mL: POTENTIAL  MYOCARDIAL INJURY. Repeat  testing in 3-6 hrs if  clinically indicated. NOTE: An increase or decrease  of 30% or more on serial  testing suggests a  clinically important change)  Routine UA:  08-Jan-16 19:28   Color (UA) Yellow  Clarity (UA) Clear  Glucose (UA) Negative  Bilirubin (UA) Negative  Ketones (UA) Negative  Specific Gravity (UA) 1.029  Blood (UA) Negative  pH (UA) 9.0  Protein (UA) Negative  Nitrite (UA) Negative  Leukocyte Esterase (UA) Negative (Result(s) reported on 14 Jan 2014 at 07:52PM.)  RBC (UA) 1 /HPF  WBC (UA) 3 /HPF  Bacteria (UA) NONE SEEN  Epithelial Cells (UA) <1 /HPF (Result(s) reported on 14 Jan 2014 at 07:52PM.)  Routine Hem:  08-Jan-16 17:08   WBC (CBC) 8.0  RBC (CBC) 4.48  Hemoglobin (CBC) 13.7  Hematocrit (CBC) 41.6  Platelet Count (CBC) 270 (Result(s) reported on 14 Jan 2014 at 05:36PM.)  MCV 93  MCH 30.6  MCHC 33.0  RDW 14.3   Radiology Results: XRay:    08-Jan-16 20:37, Chest Portable Single View  Chest Portable Single View  REASON FOR EXAM:    post ng tube placement  COMMENTS:       PROCEDURE: DXR - DXR PORTABLE CHEST SINGLE VIEW  - Jan 14 2014  8:37PM     CLINICAL DATA:  81year old female with upper abdominal pain since  this morning. Nasogastric tube placement.    EXAM:  PORTABLE CHEST - 1 VIEW    COMPARISON:  08/27/2014.    FINDINGS:  Film is underpenetrated limiting the diagnostic sensitivity  and  specificity of the examination. With these limitations in mind, a  nasogastric tube is noted, the tip of which extends at least to the  level the gastroesophageal junction. The possibility of more distal  placement is difficult to exclude given the limitations of this  examination. Lung volumes are low. No consolidative airspace  disease. No pleural effusions. No evidence of pulmonary edema. Heart  size is normal. Upper mediastinal contours are within normal limits.  Atherosclerosis in the thoracic aorta. Status post right shoulder  arthroplasty. Soft tissue anchors in the left humeral head  suggesting prior rotator cuff surgery. Orthopedic fixation hardware  in the lumbar spine also incompletely  visualized.     IMPRESSION:  1. Limited study secondary to underpenetration. Tip of nasogastric  tube goes to at least the level of the gastroesophageal junction.  2. Low lung volumes without radiographic evidence of acute  cardiopulmonary disease.  3. Atherosclerosis.      Electronically Signed    By: Vinnie Langton M.D.    On: 01/14/2014 21:07         Verified By: Etheleen Mayhew, M.D.,  LabUnknown:    08-Jan-16 18:35, CT Abdomen and Pelvis With Contrast  PACS Image    08-Jan-16 20:37, Chest Portable Single View  PACS Image  CT:    08-Jan-16 18:35, CT Abdomen and Pelvis With Contrast  CT Abdomen and Pelvis With Contrast  REASON FOR EXAM:    (1) luq and rlq abd pain; (2) luq and rlq abd pain  COMMENTS:       PROCEDURE: CT  - CT ABDOMEN / PELVIS  W  - Jan 14 2014  6:35PM     CLINICAL DATA:  Abdominal pain, vomiting. Status post  cholecystectomy. History of of breast cancer status post lumpectomy.    EXAM:  CT ABDOMEN AND PELVIS WITH CONTRAST    TECHNIQUE:  Multidetector CT imaging of the abdomen and pelvis was performed  using the standard protocol following bolus administration of  intravenous contrast.  CONTRAST: 100 mL Omnipaque 300 IV    COMPARISON:   None.    FINDINGS:  Lower chest:  Lung bases are clear.    Cardiomegaly.  Coronary atherosclerosis.    Hepatobiliary: Liver is within normal limits. No  suspicious/enhancing hepatic lesions.    Status post cholecystectomy. No intrahepatic or extrahepatic ductal  dilatation.  Pancreas: Within normal limits.    Spleen: Within normal limits.    Adrenals/Urinary Tract: Adrenal glands are unremarkable.    Kidneys are within normal limits.  No hydronephrosis.    Bladder is within normal limits.    Stomach/Bowel: Stomach is within normal limits.    Dilated loops of small bowel in the left upper/mid abdomen. Gradual  transition to an abnormal loop of thick-walled of bowel in the left  mid abdomen (series 2/image 58). Continued transition to an  angulated/kinked loop of mid/distal small bowel in the lower mid  abdomen (series 2/image 56), suggesting adhesions. Distally, all  loops of small bowel are narrowed/decompressed.    Normal appendix.    Ascending/transverse colon is not decompressed. Sigmoid  diverticulosis, without evidence of diverticulitis.    Vascular/Lymphatic: Atherosclerotic calcifications of the abdominal  aorta and branch vessels.    Fusiform ectasia of the infrarenal abdominal aorta measuring 2.2 x  2.5 cm.  No suspicious abdominopelvic lymphadenopathy.    Reproductive: Status post hysterectomy.    Bilateral ovaries are unremarkable.    Other: No abdominopelvic ascites.    Small fat containing bilateral inguinal hernias.    Musculoskeletal: Degenerative changes of the visualized  thoracolumbar spine.    Status post PLIF at L3-4.  No focal osseous lesions.     IMPRESSION:  Dilated loops of small bowel in the left upper/mid abdomen, favored  to reflect at least partial small bowel obstruction, likely on the  basis of adhesions.      Electronically Signed    By: Julian Hy M.D.    On: 01/14/2014 19:00         Verified By: Julian Hy, M.D.,    Assessment/Admission Diagnosis possible partial SBO. I independently reviewed the CT and there does  appear to be a relatively abrupt change in SB caliper, but the proximal SB is only mildly dilated, and there is liquid stool throughout the cecum, ascending and tranasverse colons, without significant air/fluid levels.   Plan Admit, IVF, NG suction. Repeat exam and CBC and electrolytes in AM. By then her NG ouput will be evaluable too.   Electronic Signatures: Consuela Mimes (MD)  (Signed 08-Jan-16 21:34)  Authored: CHIEF COMPLAINT and HISTORY, PAST MEDICAL/SURGIAL HISTORY, ALLERGIES, HOME MEDICATIONS, FAMILY AND SOCIAL HISTORY, REVIEW OF SYSTEMS, PHYSICAL EXAM, LABS, Radiology, ASSESSMENT AND PLAN   Last Updated: 08-Jan-16 21:34 by Consuela Mimes (MD)

## 2014-05-08 NOTE — Consult Note (Signed)
PATIENT NAME:  Cynthia Raymond, Cynthia Raymond MR#:  [id number] DATE OF BIRTH:  Sep 29, 1929  DATE OF CONSULTATION:  01/14/2014  REFERRING PHYSICIAN:  Consuela Mimes, MD, requesting physician CONSULTING PHYSICIAN:  Monica Becton, MD  REASON FOR CONSULT: Medical management.   HISTORY OF PRESENT ILLNESS: Ms. Geisler is an 81 year old female with a history of hypertension, hyperlipidemia, left breast CA, status post lumpectomy and previous history of cholecystectomy and hysterectomy, was doing well until yesterday evening when patient developed severe  abdominal pain. The patient continued to have the pain throughout the night. In the morning started to have multiple episodes of vomiting. Concerning this, came to the Emergency Department. Workup in the Emergency Department with a CT abdomen and pelvis showed dilated loops of small bowel with transition point in the left mid abdomen. The patient is admitted under gender surgery. NG tube is placed.  conservative management. Medicine has been consulted for medical management. The patient has a history of hypertension, hypothyroidism, COPD. TEE patient usually is on 2 liters of oxygen. Per patient, usually blood pressure is well controlled. No previous history of chest pain, however, had a stress test done about 6 weeks back, did not show any obvious inducible ischemia.   PAST MEDICAL HISTORY:  1.  Left breast cancer with lumpectomy.  2.  Osteoarthritis.  3.  Hypothyroidism.  4.  Hypertension.   PAST SURGICAL HISTORY:  1.  Left total hip replacement.  2.  Bilateral shoulder surgery.  3.  Right total knee replacement.  4.  Thyroidectomy.  5.  Cholecystectomy.  6.  Hysterectomy.  7.  Lumpectomy.   ALLERGIES:  1.  PENICILLIN.  2.  VENLAFAXINE.   3.  LATEX.  4.  SULFA.  HOME MEDICATIONS:  1.  Trazodone 50 mg once a day.  2.  Tramadol 50 mg every 6 hours as needed.  3.  Spiriva 10 mcg 2 capsules once a day.  4.   10 mg once a day.  5.  Omeprazole  20 mg 2 times a day.  6.  Levothyroxine 150 mcg once a day.  7.  Hydrochlorothiazide 25 mg once a day.  8.  Atenolol 25 mg once a day.  9.  Amlodipine 5 mg daily.   SOCIAL HISTORY: Former smoker, has 40 pack-year history of smoking, quit in 1982. Denies drinking alcohol or using illicit drugs. Married. Lives with her husband.   FAMILY HISTORY: Father died at the age of 87 with sudden death.  Mother had a pacemaker.   REVIEW OF SYSTEMS:  CONSTITUTIONAL: Experiencing generalized weakness.  EYES: No change in vision.  EARS, NOSE, AND THROAT: No change in hearing.  RESPIRATORY:  Has no cough, shortness of breath.  CARDIOVASCULAR: No chest pain, palpations.  GASTROINTESTINAL: Has nausea, vomiting, abdominal pain.  GENITOURINARY: No dysuria or hematuria.  HEMATOLOGY:  No easy bruising or bleeding.  SKIN: No rash or lesions.  MUSCULOSKELETAL: No joint pains and aches.  NEUROLOGIC: No weakness or numbness in any part of the body.   PHYSICAL EXAMINATION:  GENERAL: This is a well-built, well-nourished, age-appropriate female laying down in the bed, not in distress.  VITAL SIGNS: Temperature 97.6, pulse 67, blood pressure 134/52, respiratory rate of 18, oxygen saturation is 99% on 2 liters of oxygen.  HEENT: Head normocephalic, atraumatic. There is no scleral icterus. Conjunctivae normal. Pupils equal and react to light. Mucous membranes moist. No pharyngeal erythema.  NECK: Supple. No lymphadenopathy. No JVD. No carotid bruit.  CHEST: No focal tenderness.  LUNGS: Bilateral  clear to auscultation.  HEART: S1, S2 regular. No murmurs are heard.  ABDOMEN: Bowel sounds plus. Soft. Hyperactive sounds. Has tenderness on the left side of the abdomen, has guarding. No rebound tenderness. Could not appreciate hepatosplenomegaly.  EXTREMITIES: No pedal edema. Pulses 2+.  NEUROLOGIC: The patient is alert, oriented to place, person, and time. Cranial nerves II through XII intact. Motor 5/5 in upper and  lower extremities.  SKIN: No rash or lesions.   LABORATORY DATA: CMP is completely within normal limits. Troponin less than 0.02.   CBC is completely within normal limits.   Chest x-ray, one view, portable: No evidence of cardiopulmonary disease.   ASSESSMENT AND PLAN: Ms. Seyer comes with partial small bowel obstruction.  1.  Partial small bowel obstruction. The patient has NG tube in place connected to low wall suction.  Keep the patient n.p.o. Treatment as per surgery.  2.  Hypertension. Hold all oral blood pressure medications for now. If the patient's blood pressure is increased, we will  consider giving hydralazine IV as needed.  3.  Chronic obstructive pulmonary disease. Currently the patient has good oxygen saturations. Continue with home regimen.  4.  Hypothyroidism. We will hold the medications.  5.  Debility. We will involve physical therapy.  6.  Keep the patient on the deep venous thrombosis prophylaxis with Lovenox.   TIME SPENT: 50 minutes.   Thank you for the consultation. We will continue to follow up.   ____________________________ Monica Becton, MD pv:AT D: 01/14/2014 23:18:12 ET T: 01/15/2014 00:57:21 ET JOB#: [id number]  cc: Monica Becton, MD, <Dictator> Monica Becton MD ELECTRONICALLY SIGNED 01/15/2014 23:52

## 2014-05-08 NOTE — Consult Note (Signed)
Brief Consult Note: Diagnosis: abdominal pain/partial SBO.   Patient was seen by consultant.   Consult note dictated.   Comments: Appreciate cx for 80 y/o cauc woman w hx COPD, admitted for SBO that resolved with medical management, for evaluation of LUQ/epigastric pain. Please see prior notes relating to SBO. Today her NGT is out & she says she is overall feeling much better, but has some perisitent epigastric pain, sharp at times,  duller at others. Worse with manual pressure. Unable to say what makes it better. Denies dyspepsia, NV. reports a very large orangy brown loose bowel movment this am. Pleased with this. Denies NSAID use at home. Denies GI family history. Does report a history of chronic abdominal pain, treated at one time with Sertraline by her PCP without beneft, so stopped this. States she has upcoming GI evaluation with Dr Candace Cruise as op. Has been on Omeprazole 20mg  po daily at home. Remoe history of EGD- unknown results. Last colonoscopy 64yr ago, states hx of polyps. Impression and plan: Epigastric pain/LUQ pain. Persistent. Has risk factors of hospitalization and instrumentation for PUD.  Recommend EGD for luminal evaluation as clinically feasible. Indications, benefits, risks- including but not limited to bleeding, infection, perforation, difficulty with sedation, were discussed with the patient and she is agreeable. Will discuss further with Dr Candace Cruise. Keep op appt for chonic abdominal issues..  Electronic Signatures: Stephens November H (NP)  (Signed 13-Jan-16 15:06)  Authored: Brief Consult Note   Last Updated: 13-Jan-16 15:06 by Theodore Demark (NP)

## 2014-05-08 NOTE — Consult Note (Signed)
EGD showed gastritis, which may be contributing to overall abdominal pain. Biopsies taken. Had a BM this AM. Will try clear liquids again. Placed on protonix bid yesterday. Continue PPI bid. Will sign off. Call us back if no significant improvement of abdominal pain. Thanks.  Electronic Signatures: Verdie Shire (MD)  (Signed on 14-Jan-16 11:36)  Authored  Last Updated: 14-Jan-16 11:36 by Verdie Shire (MD)

## 2014-06-03 DIAGNOSIS — G5602 Carpal tunnel syndrome, left upper limb: Secondary | ICD-10-CM | POA: Insufficient documentation

## 2014-07-04 ENCOUNTER — Other Ambulatory Visit: Payer: Self-pay

## 2014-07-27 ENCOUNTER — Telehealth: Payer: Self-pay | Admitting: Family Medicine

## 2014-10-19 NOTE — Telephone Encounter (Signed)
Error

## 2014-10-26 ENCOUNTER — Emergency Department: Payer: Medicare Other

## 2014-10-26 ENCOUNTER — Encounter: Payer: Self-pay | Admitting: Emergency Medicine

## 2014-10-26 ENCOUNTER — Emergency Department
Admission: EM | Admit: 2014-10-26 | Discharge: 2014-10-26 | Disposition: A | Discharge status: Home / Self Care | Payer: Medicare Other | Attending: Emergency Medicine | Admitting: Emergency Medicine

## 2014-10-26 DIAGNOSIS — Y9289 Other specified places as the place of occurrence of the external cause: Secondary | ICD-10-CM | POA: Diagnosis not present

## 2014-10-26 DIAGNOSIS — S4991XA Unspecified injury of right shoulder and upper arm, initial encounter: Secondary | ICD-10-CM | POA: Diagnosis not present

## 2014-10-26 DIAGNOSIS — Z91040 Latex allergy status: Secondary | ICD-10-CM | POA: Insufficient documentation

## 2014-10-26 DIAGNOSIS — T148 Other injury of unspecified body region: Secondary | ICD-10-CM | POA: Insufficient documentation

## 2014-10-26 DIAGNOSIS — S79911A Unspecified injury of right hip, initial encounter: Secondary | ICD-10-CM | POA: Insufficient documentation

## 2014-10-26 DIAGNOSIS — Z87891 Personal history of nicotine dependence: Secondary | ICD-10-CM | POA: Diagnosis not present

## 2014-10-26 DIAGNOSIS — W1839XA Other fall on same level, initial encounter: Secondary | ICD-10-CM | POA: Insufficient documentation

## 2014-10-26 DIAGNOSIS — Z79899 Other long term (current) drug therapy: Secondary | ICD-10-CM | POA: Insufficient documentation

## 2014-10-26 DIAGNOSIS — Y998 Other external cause status: Secondary | ICD-10-CM | POA: Insufficient documentation

## 2014-10-26 DIAGNOSIS — I10 Essential (primary) hypertension: Secondary | ICD-10-CM | POA: Diagnosis not present

## 2014-10-26 DIAGNOSIS — Y9389 Activity, other specified: Secondary | ICD-10-CM | POA: Insufficient documentation

## 2014-10-26 DIAGNOSIS — W19XXXA Unspecified fall, initial encounter: Secondary | ICD-10-CM

## 2014-10-26 DIAGNOSIS — Z880 Allergy status to penicillin: Secondary | ICD-10-CM | POA: Diagnosis not present

## 2014-10-26 DIAGNOSIS — Z7982 Long term (current) use of aspirin: Secondary | ICD-10-CM | POA: Diagnosis not present

## 2014-10-26 DIAGNOSIS — S8991XA Unspecified injury of right lower leg, initial encounter: Secondary | ICD-10-CM | POA: Diagnosis present

## 2014-10-26 DIAGNOSIS — T148XXA Other injury of unspecified body region, initial encounter: Secondary | ICD-10-CM

## 2014-10-26 MED ORDER — ACETAMINOPHEN 325 MG PO TABS
650.0000 mg | ORAL_TABLET | Freq: Once | ORAL | Status: AC
  Administered 2014-10-26: 650 mg via ORAL
  Filled 2014-10-26: qty 2

## 2014-10-26 NOTE — ED Notes (Signed)
Patient brought in by [facility], per EMS patient tripped over a stump in the yard and fell, patient landed on her right side. Per EMS there is obvious deformity to the right shoulder. Patient is also c/o right knee pain and neck pain. Patient reports that she did hit her head. Patient is not on any blood thinners.

## 2014-10-26 NOTE — Discharge Instructions (Signed)
At this time, there is no evidence of acute broken bone despite extensive x-rays and you are able to walk. If you have any areas of continued discomfort that are of concern return to the emergency room or follow-up closely with your primary care doctor or your orthopedic surgeon. If you have any other new or worrisome symptoms please return to the emergency department.

## 2014-10-26 NOTE — ED Notes (Signed)
Patients medication put in a patient belonging bag and given to patients daughter, Ramiro Harvest.

## 2014-10-26 NOTE — ED Provider Notes (Signed)
[facility] Emergency Department Provider Note  ____________________________________________   I have reviewed the triage vital signs and the nursing notes.   HISTORY  Chief Complaint Fall    HPI Cynthia Raymond is a 81 y.o. female with a history of multiple joint replacements on the right, who fell in a nonsmoker fall today. She is not anticoagulated. She states she did not hit her head. She does have multiple aches and pains, the right side of her neck hurts her right shoulder hurts, her right hip hurts, her right knee hurts. She states that she does not leave anything is broken and she states she has chronic pain in all these areas and it does not feel much different from that. She was otherwise in her normal state of health there was no syncopal or presyncopal symptoms. At no time she had a chest pain or shortness breath. She is eager to go home.She is refusing pain medication  Past Medical History  Diagnosis Date  . Hypertension   . Thyroid disease   . Graves disease   . Osteoporosis   . Cancer [facility])     breast    Patient Active Problem List   Diagnosis Date Noted  . Aortic atherosclerosis ([place]) 02/12/2013  . Abdominal pain, chronic, right lower quadrant 02/12/2013  . Weakness of limb 02/12/2013    Past Surgical History  Procedure Laterality Date  . Breast lumpectomy    . Joint replacement Bilateral   . Back surgery      X3  . Rotator cuff repair Bilateral   . Elbow surgery Right   . Hand surgery Right     Current Outpatient Rx  Name  Route  Sig  Dispense  Refill  . amLODipine (NORVASC) 5 MG tablet   Oral   Take 5 mg by mouth daily.         Marland Kitchen aspirin 81 MG tablet   Oral   Take 81 mg by mouth daily.         Marland Kitchen atenolol (TENORMIN) 25 MG tablet   Oral   Take 25 mg by mouth daily.         . Cholecalciferol (VITAMIN D PO)   Oral   Take 2 capsules by mouth daily.         . Cyanocobalamin (VITAMIN B 12 PO)   Oral    Take 1 tablet by mouth daily.         . hydrochlorothiazide (HYDRODIURIL) 25 MG tablet   Oral   Take 25 mg by mouth daily.         Marland Kitchen levothyroxine (SYNTHROID, LEVOTHROID) 150 MCG tablet   Oral   Take 150 mcg by mouth daily before breakfast.         . ramipril (ALTACE) 10 MG capsule   Oral   Take 10 mg by mouth daily.         . sertraline (ZOLOFT) 50 MG tablet   Oral   Take 100 mg by mouth daily.         . vitamin C (ASCORBIC ACID) 500 MG tablet   Oral   Take 500 mg by mouth daily.         Marland Kitchen zolpidem (AMBIEN) 10 MG tablet   Oral   Take 10 mg by mouth at bedtime as needed for sleep.           Allergies Penicillins; Amitriptyline; Latex; Morphine and related; and Sulfa antibiotics  Family History  Problem  Relation Age of Onset  . Cancer Mother   . Heart attack Father   . Cancer Sister   . Heart disease Sister   . Hyperlipidemia Sister   . Heart attack Sister   . Cancer Brother   . Cancer Daughter     Social History Social History  Substance Use Topics  . Smoking status: Former Smoker    Quit date: 01/08/1987  . Smokeless tobacco: None     Comment: Quit 1989  . Alcohol Use: No    Review of Systems Constitutional: No fever/chills Eyes: No visual changes. ENT: No sore throat. No stiff neck no neck pain Cardiovascular: Denies chest pain. Respiratory: Denies shortness of breath. Gastrointestinal:   no vomiting.  No diarrhea.  No constipation. Genitourinary: Negative for dysuria. Musculoskeletal: Negative lower extremity swelling Skin: Negative for rash. Neurological: Negative for headaches, focal weakness or numbness. 10-point ROS otherwise negative.  ____________________________________________   PHYSICAL EXAM:  VITAL SIGNS: ED Triage Vitals  Enc Vitals Group     BP 10/26/14 1357 170/65 mmHg     Pulse Rate 10/26/14 1357 65     Resp 10/26/14 1357 15     Temp 10/26/14 1357 98.1 F (36.7 C)     Temp Source 10/26/14 1357 Oral     SpO2  10/26/14 1357 94 %     Weight 10/26/14 1357 200 lb (90.719 kg)     Height 10/26/14 1357 5\' 6"  (1.676 m)     Head Cir --      Peak Flow --      Pain Score 10/26/14 1359 10     Pain Loc --      Pain Edu? --      Excl. in [place]? --     Constitutional: Alert and oriented. Well appearing and in no acute distress. Eyes: Conjunctivae are normal. PERRL. EOMI. Head: Atraumatic. Nose: No congestion/rhinnorhea. Mouth/Throat: Mucous membranes are moist.  Oropharynx non-erythematous. Neck: No stridoris minimal tenderness to palpation in the trapezius muscle on the right no focal midline tenderness meningismus Cardiovascular: Normal rate, regular rhythm. Grossly normal heart sounds.  Good peripheral circulation. Respiratory: Normal respiratory effort.  No retractions. Lungs CTAB. Gastrointestinal: Soft and nontender. No distention. No guarding no rebound Back:  There is no focal tenderness or step off there is no midline tenderness there are no lesions noted. there is no CVA tenderness Musculoskeletal:Patient has pretty much tenderness everywhere I touch but the most principal areas of pain are in the right knee which is fully rangeable right hip which is fully rangable and right shoulder which is fully rangeable. There is no deformity or step-off. tone distal pulses    rologic:  Normal speech and language. No gross focal neurologic deficits are appreciated.  Skin:  Skin is warm, dry and intact. No rash noted. Psychiatric: Mood and affect are normal. Speech and behavior are normal.  ____________________________________________   LABS (all labs ordered are listed, but only abnormal results are displayed)  Labs Reviewed - No data to display ____________________________________________  EKG  ____________________________________________  RADIOLOG ____________________________________________   PROCEDURES  Procedure(s) performed: None  Critical Care performed:  None  ____________________________________________   INITIAL IMPRESSION / ASSESSMENT AND PLAN / ED COURSE  Pertinent labs & imaging results that were available during my care of the patient were reviewed by me and considered in my medical decision making (see chart for details).  patient with multiple different areas of discomfort after a non-syncopal fall and one nothing appears to be broken,  the patient does have multiple aches and pains. I have x-rayed every area of her body that hurts I do not see any evidence of fracture. We'll see if she can ambulate at her baseline per the patient is declining further intervention in any event.   .____________________________________________   FINAL CLINICAL IMPRESSION(S) / ED DIAGNOSES  Final diagnoses:  None     Schuyler Amor, MD 10/26/14 [phone]

## 2015-01-23 ENCOUNTER — Other Ambulatory Visit: Payer: Self-pay | Admitting: Specialist

## 2015-01-23 DIAGNOSIS — R918 Other nonspecific abnormal finding of lung field: Secondary | ICD-10-CM

## 2015-01-30 ENCOUNTER — Ambulatory Visit
Admission: RE | Admit: 2015-01-30 | Discharge: 2015-01-30 | Disposition: A | Discharge status: Home / Self Care | Payer: Medicare Other | Source: Ambulatory Visit | Attending: Specialist | Admitting: Specialist

## 2015-01-30 DIAGNOSIS — I2510 Atherosclerotic heart disease of native coronary artery without angina pectoris: Secondary | ICD-10-CM | POA: Insufficient documentation

## 2015-01-30 DIAGNOSIS — R918 Other nonspecific abnormal finding of lung field: Secondary | ICD-10-CM | POA: Diagnosis not present

## 2015-02-14 ENCOUNTER — Emergency Department: Payer: Medicare Other

## 2015-02-14 ENCOUNTER — Encounter: Payer: Self-pay | Admitting: Emergency Medicine

## 2015-02-14 ENCOUNTER — Emergency Department
Admission: EM | Admit: 2015-02-14 | Discharge: 2015-02-14 | Disposition: A | Discharge status: Home / Self Care | Payer: Medicare Other | Attending: Emergency Medicine | Admitting: Emergency Medicine

## 2015-02-14 DIAGNOSIS — T148XXA Other injury of unspecified body region, initial encounter: Secondary | ICD-10-CM

## 2015-02-14 DIAGNOSIS — Z91040 Latex allergy status: Secondary | ICD-10-CM | POA: Insufficient documentation

## 2015-02-14 DIAGNOSIS — Z79899 Other long term (current) drug therapy: Secondary | ICD-10-CM | POA: Diagnosis not present

## 2015-02-14 DIAGNOSIS — Z87891 Personal history of nicotine dependence: Secondary | ICD-10-CM | POA: Diagnosis not present

## 2015-02-14 DIAGNOSIS — Z880 Allergy status to penicillin: Secondary | ICD-10-CM | POA: Insufficient documentation

## 2015-02-14 DIAGNOSIS — W108XXA Fall (on) (from) other stairs and steps, initial encounter: Secondary | ICD-10-CM | POA: Diagnosis not present

## 2015-02-14 DIAGNOSIS — Z7982 Long term (current) use of aspirin: Secondary | ICD-10-CM | POA: Diagnosis not present

## 2015-02-14 DIAGNOSIS — Y9301 Activity, walking, marching and hiking: Secondary | ICD-10-CM | POA: Insufficient documentation

## 2015-02-14 DIAGNOSIS — S0990XA Unspecified injury of head, initial encounter: Secondary | ICD-10-CM | POA: Diagnosis not present

## 2015-02-14 DIAGNOSIS — S0003XA Contusion of scalp, initial encounter: Secondary | ICD-10-CM | POA: Diagnosis not present

## 2015-02-14 DIAGNOSIS — I10 Essential (primary) hypertension: Secondary | ICD-10-CM | POA: Insufficient documentation

## 2015-02-14 DIAGNOSIS — W19XXXA Unspecified fall, initial encounter: Secondary | ICD-10-CM

## 2015-02-14 DIAGNOSIS — S4991XA Unspecified injury of right shoulder and upper arm, initial encounter: Secondary | ICD-10-CM | POA: Diagnosis not present

## 2015-02-14 DIAGNOSIS — S79911A Unspecified injury of right hip, initial encounter: Secondary | ICD-10-CM | POA: Insufficient documentation

## 2015-02-14 DIAGNOSIS — M25511 Pain in right shoulder: Secondary | ICD-10-CM

## 2015-02-14 DIAGNOSIS — S4992XA Unspecified injury of left shoulder and upper arm, initial encounter: Secondary | ICD-10-CM | POA: Insufficient documentation

## 2015-02-14 DIAGNOSIS — Y92009 Unspecified place in unspecified non-institutional (private) residence as the place of occurrence of the external cause: Secondary | ICD-10-CM | POA: Diagnosis not present

## 2015-02-14 DIAGNOSIS — Y998 Other external cause status: Secondary | ICD-10-CM | POA: Insufficient documentation

## 2015-02-14 DIAGNOSIS — M25512 Pain in left shoulder: Secondary | ICD-10-CM

## 2015-02-14 LAB — COMPREHENSIVE METABOLIC PANEL
ALBUMIN: 4.1 g/dL (ref 3.5–5.0)
ALK PHOS: 64 U/L (ref 38–126)
ALT: 20 U/L (ref 14–54)
ANION GAP: 6 (ref 5–15)
AST: 23 U/L (ref 15–41)
BUN: 20 mg/dL (ref 6–20)
CALCIUM: 8.9 mg/dL (ref 8.9–10.3)
CO2: 30 mmol/L (ref 22–32)
Chloride: 100 mmol/L — ABNORMAL LOW (ref 101–111)
Creatinine, Ser: 0.92 mg/dL (ref 0.44–1.00)
GFR calc Af Amer: >60 mL/min (ref >60)
GFR, EST NON AFRICAN AMERICAN: 56 mL/min — AB (ref >60)
GLUCOSE: 139 mg/dL — AB (ref 65–99)
Potassium: 4.4 mmol/L (ref 3.5–5.1)
Sodium: 136 mmol/L (ref 135–145)
TOTAL PROTEIN: 7.4 g/dL (ref 6.5–8.1)
Total Bilirubin: 0.7 mg/dL (ref 0.3–1.2)

## 2015-02-14 LAB — CBC
HCT: 40.7 % (ref 35.0–47.0)
HEMOGLOBIN: 13.7 g/dL (ref 12.0–16.0)
MCH: 31.6 pg (ref 26.0–34.0)
MCHC: 33.7 g/dL (ref 32.0–36.0)
MCV: 93.8 fL (ref 80.0–100.0)
PLATELETS: 214 10*3/uL (ref 150–440)
RBC: 4.34 MIL/uL (ref 3.80–5.20)
RDW: 13.1 % (ref 11.5–14.5)
WBC: 7.2 10*3/uL (ref 3.6–11.0)

## 2015-02-14 LAB — TROPONIN I: Troponin I: 0.03 ng/mL (ref <0.031)

## 2015-02-14 MED ORDER — TRAMADOL HCL 50 MG PO TABS
50.0000 mg | ORAL_TABLET | Freq: Once | ORAL | Status: AC
  Administered 2015-02-14: 50 mg via ORAL
  Filled 2015-02-14: qty 1

## 2015-02-14 MED ORDER — TRAMADOL HCL 50 MG PO TABS: 50.0000 mg | ORAL_TABLET | Freq: Three times a day (TID) | ORAL | Status: AC | PRN

## 2015-02-14 NOTE — ED Notes (Signed)
Patient was at home and fell down 4 steps. Patient states she is not sure what happened. States she doesn't think she had syncopal episode. Patient dizzy upon standing in triage room.

## 2015-02-14 NOTE — Discharge Instructions (Signed)
As we discussed it is important that you follow up with your primary care doctor to schedule an ultrasound of your aorta. Please seek medical attention for any high fevers, chest pain, shortness of breath, change in behavior, persistent vomiting, bloody stool or any other new or concerning symptoms.  Fall Prevention in the Home  Falls can cause injuries and can affect people from all age groups. There are many simple things that you can do to make your home safe and to help prevent falls. WHAT CAN I DO ON THE OUTSIDE OF MY HOME?  Regularly repair the edges of walkways and driveways and fix any cracks.  Remove high doorway thresholds.  Trim any shrubbery on the main path into your home.  Use bright outdoor lighting.  Clear walkways of debris and clutter, including tools and rocks.  Regularly check that handrails are securely fastened and in good repair. Both sides of any steps should have handrails.  Install guardrails along the edges of any raised decks or porches.  Have leaves, snow, and ice cleared regularly.  Use sand or salt on walkways during winter months.  In the garage, clean up any spills right away, including grease or oil spills. WHAT CAN I DO IN THE BATHROOM?  Use night lights.  Install grab bars by the toilet and in the tub and shower. Do not use towel bars as grab bars.  Use non-skid mats or decals on the floor of the tub or shower.  If you need to sit down while you are in the shower, use a plastic, non-slip stool.Marland Kitchen  Keep the floor dry. Immediately clean up any water that spills on the floor.  Remove soap buildup in the tub or shower on a regular basis.  Attach bath mats securely with double-sided non-slip rug tape.  Remove throw rugs and other tripping hazards from the floor. WHAT CAN I DO IN THE BEDROOM?  Use night lights.  Make sure that a bedside light is easy to reach.  Do not use oversized bedding that drapes onto the floor.  Have a firm chair  that has side arms to use for getting dressed.  Remove throw rugs and other tripping hazards from the floor. WHAT CAN I DO IN THE KITCHEN?   Clean up any spills right away.  Avoid walking on wet floors.  Place frequently used items in easy-to-reach places.  If you need to reach for something above you, use a sturdy step stool that has a grab bar.  Keep electrical cables out of the way.  Do not use floor polish or wax that makes floors slippery. If you have to use wax, make sure that it is non-skid floor wax.  Remove throw rugs and other tripping hazards from the floor. WHAT CAN I DO IN THE STAIRWAYS?  Do not leave any items on the stairs.  Make sure that there are handrails on both sides of the stairs. Fix handrails that are broken or loose. Make sure that handrails are as long as the stairways.  Check any carpeting to make sure that it is firmly attached to the stairs. Fix any carpet that is loose or worn.  Avoid having throw rugs at the top or bottom of stairways, or secure the rugs with carpet tape to prevent them from moving.  Make sure that you have a light switch at the top of the stairs and the bottom of the stairs. If you do not have them, have them installed. WHAT ARE SOME OTHER  FALL PREVENTION TIPS?  Wear closed-toe shoes that fit well and support your feet. Wear shoes that have rubber soles or low heels.  When you use a stepladder, make sure that it is completely opened and that the sides are firmly locked. Have someone hold the ladder while you are using it. Do not climb a closed stepladder.  Add color or contrast paint or tape to grab bars and handrails in your home. Place contrasting color strips on the first and last steps.  Use mobility aids as needed, such as canes, walkers, scooters, and crutches.  Turn on lights if it is dark. Replace any light bulbs that burn out.  Set up furniture so that there are clear paths. Keep the furniture in the same spot.  Fix  any uneven floor surfaces.  Choose a carpet design that does not hide the edge of steps of a stairway.  Be aware of any and all pets.  Review your medicines with your healthcare provider. Some medicines can cause dizziness or changes in blood pressure, which increase your risk of falling. Talk with your health care provider about other ways that you can decrease your risk of falls. This may include working with a physical therapist or trainer to improve your strength, balance, and endurance.   This information is not intended to replace advice given to you by your health care provider. Make sure you discuss any questions you have with your health care provider.   Document Released: 12/14/2001 Document Revised: 05/10/2014 Document Reviewed: 01/28/2014 Elsevier Interactive Patient Education [employer].

## 2015-02-14 NOTE — ED Provider Notes (Signed)
[facility] Emergency Department Provider Note    ____________________________________________  Time seen: ~1555  I have reviewed the triage vital signs and the nursing notes.   HISTORY  Chief Complaint Fall   History limited by: Not Limited   HPI Cynthia Raymond is a 82 y.o. female who presents to the emergency department today after a fall. The patient states she was walking down for steps with a laundry basket in her hands when she fell. She is not clear why she fell however she denies any chest pain palpitations or shortness breath at the time of fall.She states that when she fell she hit her shoulder against the door and door knob which then spun her around so that she hit her other shoulder. She also hit her head. She denies any loss of consciousness. She was able to get up by herself after a little bit of time. She states she did feel somewhat dizzy although this has gotten better. She says she usually does not go down the steps without holding the railing which she did not do this time. She denies any recent fevers, nausea vomiting, abdominal pain, shortness breath or chest pain.     Past Medical History  Diagnosis Date  . Hypertension   . Thyroid disease   . Graves disease   . Osteoporosis   . Cancer [facility])     breast    Patient Active Problem List   Diagnosis Date Noted  . Aortic atherosclerosis ([place]) 02/12/2013  . Abdominal pain, chronic, right lower quadrant 02/12/2013  . Weakness of limb 02/12/2013    Past Surgical History  Procedure Laterality Date  . Breast lumpectomy    . Back surgery      X3  . Rotator cuff repair Bilateral   . Elbow surgery Right   . Hand surgery Right   . Joint replacement Bilateral     Both knees, left shoulder    Current Outpatient Rx  Name  Route  Sig  Dispense  Refill  . amLODipine (NORVASC) 5 MG tablet   Oral   Take 5 mg by mouth daily.         Marland Kitchen aspirin 81 MG tablet   Oral   Take 81  mg by mouth daily.         Marland Kitchen atenolol (TENORMIN) 25 MG tablet   Oral   Take 25 mg by mouth daily.         . Cholecalciferol (VITAMIN D PO)   Oral   Take 2 capsules by mouth daily.         . Cyanocobalamin (VITAMIN B 12 PO)   Oral   Take 1 tablet by mouth daily.         . hydrochlorothiazide (HYDRODIURIL) 25 MG tablet   Oral   Take 25 mg by mouth daily.         Marland Kitchen levothyroxine (SYNTHROID, LEVOTHROID) 150 MCG tablet   Oral   Take 150 mcg by mouth daily before breakfast.         . ramipril (ALTACE) 10 MG capsule   Oral   Take 10 mg by mouth daily.         . sertraline (ZOLOFT) 50 MG tablet   Oral   Take 100 mg by mouth daily.         . vitamin C (ASCORBIC ACID) 500 MG tablet   Oral   Take 500 mg by mouth daily.         Marland Kitchen  zolpidem (AMBIEN) 10 MG tablet   Oral   Take 10 mg by mouth at bedtime as needed for sleep.           Allergies Penicillins; Amitriptyline; Latex; Morphine and related; and Sulfa antibiotics  Family History  Problem Relation Age of Onset  . Cancer Mother   . Heart attack Father   . Cancer Sister   . Heart disease Sister   . Hyperlipidemia Sister   . Heart attack Sister   . Cancer Brother   . Cancer Daughter     Social History Social History  Substance Use Topics  . Smoking status: Former Smoker    Quit date: 01/08/1987  . Smokeless tobacco: None     Comment: Quit 1989  . Alcohol Use: No    Review of Systems  Constitutional: Negative for fever. Cardiovascular: Negative for chest pain. Respiratory: Negative for shortness of breath. Gastrointestinal: Negative for abdominal pain, vomiting and diarrhea. Neurological: Negative for headaches, focal weakness or numbness. 10-point ROS otherwise negative.  ____________________________________________   PHYSICAL EXAM:  VITAL SIGNS: ED Triage Vitals  Enc Vitals Group     BP 02/14/15 1323 149/74 mmHg     Pulse Rate 02/14/15 1323 71     Resp 02/14/15 1323 18      Temp 02/14/15 1323 98.1 F (36.7 C)     Temp Source 02/14/15 1323 Oral     SpO2 02/14/15 1323 96 %     Weight 02/14/15 1323 200 lb (90.719 kg)     Height 02/14/15 1323 5\' 2"  (1.575 m)     Head Cir --      Peak Flow --      Pain Score 02/14/15 1323 8   Constitutional: Alert and oriented. Well appearing and in no distress. Eyes: Conjunctivae are normal. PERRL. Normal extraocular movements. ENT   Head: Normocephalic and atraumatic.   Nose: No congestion/rhinnorhea.   Mouth/Throat: Mucous membranes are moist.   Neck: No stridor. Hematological/Lymphatic/Immunilogical: No cervical lymphadenopathy. Cardiovascular: Normal rate, regular rhythm.  No murmurs, rubs, or gallops. Respiratory: Normal respiratory effort without tachypnea nor retractions. Breath sounds are clear and equal bilaterally. No wheezes/rales/rhonchi. Gastrointestinal: Soft and nontender. No distention.  Genitourinary: Deferred Musculoskeletal: no gross deformity to the extremities. Tender to palpation bilateral shoulders. Tender to palpation of the right hip. Neurologic:  Normal speech and language. No gross focal neurologic deficits are appreciated.  Skin:  Skin is warm, dry and intact. No rash noted. Psychiatric: Mood and affect are normal. Speech and behavior are normal. Patient exhibits appropriate insight and judgment.  ____________________________________________    LABS (pertinent positives/negatives)  Labs Reviewed  COMPREHENSIVE METABOLIC PANEL - Abnormal; Notable for the following:    Chloride 100 (*)    Glucose, Bld 139 (*)    GFR calc non Af Amer 56 (*)    All other components within normal limits  CBC  TROPONIN I     ____________________________________________   EKG  I, Nance Pear, attending physician, personally viewed and interpreted this EKG  EKG Time: 1328 Rate: 67 Rhythm: normal sinus rhythm Axis: left axis deviation Intervals: qtc 450 QRS: LBBB ST changes: no st  elevation equivalent Impression: abnormal ekg  LBBB present 01/15/2014 ____________________________________________    RADIOLOGY  Hip right IMPRESSION: No acute abnormality.  Right hip osteoarthritis.  Lumbar spine IMPRESSION: Postoperative change at L3 and L4. Multilevel arthropathy, essentially stable. No fracture or spondylolisthesis. Atherosclerotic calcification in the aorta with slight widening distally which appears stable. Mild aneurysmal dilatation  in this area is questioned; this finding may warrant nonemergent ultrasound of the aorta to further assess.  Shoulder left  IMPRESSION: LEFT glenohumeral degenerative changes and osseous demineralization.  Suspected prior rotator cuff repair and resection of distal LEFT clavicle.  No acute bony abnormalities.  Shoulder right IMPRESSION: No acute abnormality.  CT head/c-spine  IMPRESSION: Scalp contusion posteriorly on the right. No other acute abnormality head or cervical spine.  Mild atrophy and chronic microvascular ischemic change.  Cervical spondylosis.  ____________________________________________   PROCEDURES  Procedure(s) performed: None  Critical Care performed: No  ____________________________________________   INITIAL IMPRESSION / ASSESSMENT AND PLAN / ED COURSE  Pertinent labs & imaging results that were available during my care of the patient were reviewed by me and considered in my medical decision making (see chart for details).  Patient presented to the emergency department today at after a fall and with diffuse musculoskeletal pain. X-rays and CT scans negative.. I did discuss the finding of the perhaps enlarged aorta on the lumbar spine film with the patient and recommended ultrasound follow-up. This point think likely mechanical fall given patient was carrying laundry and was not able to hold on the railing. No chest pain or palpitations. Blood work and EKG without acute concerning  findings.  ____________________________________________   FINAL CLINICAL IMPRESSION(S) / ED DIAGNOSES  Final diagnoses:  Bilateral shoulder pain  Fall, initial encounter  Contusion     Nance Pear, MD 02/14/15 1614

## 2016-05-09 DIAGNOSIS — N1831 Chronic kidney disease, stage 3a: Secondary | ICD-10-CM | POA: Insufficient documentation

## 2016-05-14 ENCOUNTER — Emergency Department
Admission: EM | Admit: 2016-05-14 | Discharge: 2016-05-14 | Disposition: A | Discharge status: Home / Self Care | Payer: Medicare Other | Attending: Emergency Medicine | Admitting: Emergency Medicine

## 2016-05-14 DIAGNOSIS — R55 Syncope and collapse: Secondary | ICD-10-CM | POA: Diagnosis not present

## 2016-05-14 DIAGNOSIS — Z87891 Personal history of nicotine dependence: Secondary | ICD-10-CM | POA: Diagnosis not present

## 2016-05-14 DIAGNOSIS — I10 Essential (primary) hypertension: Secondary | ICD-10-CM | POA: Diagnosis not present

## 2016-05-14 DIAGNOSIS — Z7982 Long term (current) use of aspirin: Secondary | ICD-10-CM | POA: Diagnosis not present

## 2016-05-14 DIAGNOSIS — Z91040 Latex allergy status: Secondary | ICD-10-CM | POA: Insufficient documentation

## 2016-05-14 DIAGNOSIS — Z79899 Other long term (current) drug therapy: Secondary | ICD-10-CM | POA: Insufficient documentation

## 2016-05-14 DIAGNOSIS — Z853 Personal history of malignant neoplasm of breast: Secondary | ICD-10-CM | POA: Diagnosis not present

## 2016-05-14 LAB — BASIC METABOLIC PANEL
Anion gap: 9 (ref 5–15)
BUN: 17 mg/dL (ref 6–20)
CHLORIDE: 103 mmol/L (ref 101–111)
CO2: 25 mmol/L (ref 22–32)
CREATININE: 0.98 mg/dL (ref 0.44–1.00)
Calcium: 8.6 mg/dL — ABNORMAL LOW (ref 8.9–10.3)
GFR calc non Af Amer: 52 mL/min — ABNORMAL LOW (ref >60)
GFR, EST AFRICAN AMERICAN: 60 mL/min — AB (ref >60)
Glucose, Bld: 97 mg/dL (ref 65–99)
POTASSIUM: 3.7 mmol/L (ref 3.5–5.1)
Sodium: 137 mmol/L (ref 135–145)

## 2016-05-14 LAB — CBC
HEMATOCRIT: 39.8 % (ref 35.0–47.0)
Hemoglobin: 13.7 g/dL (ref 12.0–16.0)
MCH: 32.7 pg (ref 26.0–34.0)
MCHC: 34.4 g/dL (ref 32.0–36.0)
MCV: 95.1 fL (ref 80.0–100.0)
PLATELETS: 230 10*3/uL (ref 150–440)
RBC: 4.18 MIL/uL (ref 3.80–5.20)
RDW: 13.2 % (ref 11.5–14.5)
WBC: 4.9 10*3/uL (ref 3.6–11.0)

## 2016-05-14 LAB — TROPONIN I: Troponin I: <0.03 ng/mL (ref <0.03)

## 2016-05-14 LAB — GLUCOSE, CAPILLARY: Glucose-Capillary: 99 mg/dL (ref 65–99)

## 2016-05-14 NOTE — ED Notes (Addendum)
Pts daughter at bedside. Daughter reports that pt has had hx of near syncopal episodes, freq incr w/ when pt stressed.  Pt sts that she had small breakfast this AM. Pt sts that she "got hot and sweaty", felt like she was going to pass out.

## 2016-05-14 NOTE — Discharge Instructions (Signed)
Please seek medical attention for any high fevers, chest pain, shortness of breath, change in behavior, persistent vomiting, bloody stool or any other new or concerning symptoms.  

## 2016-05-14 NOTE — ED Triage Notes (Addendum)
Pt bib [facility], had a near syncopal episode that was witnessed by staff.  Pt lowered to ground by staff.  Pt A/OX4, resp even and unlabored.  No head injury, n/v/d or SOB.  Pt dnies medical complaints, sts that she was at [facility] to give urine sample in preporation for upcoming hip surgery.  Pt able to answer all questions.  Able to move limbs on command

## 2016-05-14 NOTE — ED Provider Notes (Signed)
[facility] Emergency Department Provider Note  ____________________________________________   I have reviewed the triage vital signs and the nursing notes.   HISTORY  Chief Complaint Near Syncope   History limited by: Not Limited   HPI Cynthia Raymond is a 83 y.o. female who presents to the emergency department today because of a near syncopal episode. The patient was at clinic today to give a urine sample for scheduled hip surgery next week. She states that shortly after using the restroom as she was walking out she started feeling warm and hot. She stated start sweating. She then felt very weak and lightheaded. Patient was helped down to the ground. As I'm my exam the patient is feeling better. She states that this has happened to her in the past and recently they think was related to her being prediabetic. Patient states that she only had a teaspoon of peanut butter and a corner of toast today. Patient did not have any chest pain palpitations when this occurred.   Past Medical History:  Diagnosis Date  . Cancer ([place])    breast  . Graves disease   . Hypertension   . Osteoporosis   . Thyroid disease     Patient Active Problem List   Diagnosis Date Noted  . Aortic atherosclerosis ([place]) 02/12/2013  . Abdominal pain, chronic, right lower quadrant 02/12/2013  . Weakness of limb 02/12/2013    Past Surgical History:  Procedure Laterality Date  . BACK SURGERY     X3  . BREAST LUMPECTOMY    . ELBOW SURGERY Right   . HAND SURGERY Right   . JOINT REPLACEMENT Bilateral    Both knees, left shoulder  . ROTATOR CUFF REPAIR Bilateral     Prior to Admission medications   Medication Sig Start Date End Date Taking? Authorizing Provider  amLODipine (NORVASC) 5 MG tablet Take 5 mg by mouth daily.    [provider]  aspirin 81 MG tablet Take 81 mg by mouth daily.    [provider]  atenolol (TENORMIN) 25 MG tablet Take 25 mg by mouth  daily.    [provider]  Cholecalciferol (VITAMIN D PO) Take 2 capsules by mouth daily.    [provider]  Cyanocobalamin (VITAMIN B 12 PO) Take 1 tablet by mouth daily.    [provider]  hydrochlorothiazide (HYDRODIURIL) 25 MG tablet Take 25 mg by mouth daily.    [provider]  levothyroxine (SYNTHROID, LEVOTHROID) 150 MCG tablet Take 150 mcg by mouth daily before breakfast.    [provider]  ramipril (ALTACE) 10 MG capsule Take 10 mg by mouth daily.    [provider]  sertraline (ZOLOFT) 50 MG tablet Take 100 mg by mouth daily.    [provider]  vitamin C (ASCORBIC ACID) 500 MG tablet Take 500 mg by mouth daily.    [provider]  zolpidem (AMBIEN) 10 MG tablet Take 10 mg by mouth at bedtime as needed for sleep.    [provider]    Allergies Penicillins; Amitriptyline; Latex; Morphine and related; and Sulfa antibiotics  Family History  Problem Relation Age of Onset  . Cancer Mother   . Heart attack Father   . Cancer Sister   . Heart disease Sister   . Hyperlipidemia Sister   . Heart attack Sister   . Cancer Brother   . Cancer Daughter     Social History Social History  Substance Use Topics  .  Smoking status: Former Smoker    Quit date: 01/08/1987  . Smokeless tobacco: Never Used     Comment: Quit 1989  . Alcohol use No    Review of Systems Constitutional: Felt hot, positive sweating Eyes: No visual changes. ENT: No sore throat. Cardiovascular: Denies chest pain. Respiratory: Denies shortness of breath. Gastrointestinal: No abdominal pain.  No nausea, no vomiting.  No diarrhea.   Genitourinary: Negative for dysuria. Musculoskeletal: Negative for back pain. Skin: Negative for rash. Neurological: Negative for headaches, focal weakness or numbness.  ____________________________________________   PHYSICAL EXAM:  VITAL SIGNS: ED Triage Vitals  Enc Vitals Group     BP  05/14/16 1140 (!) 158/66     Pulse Rate 05/14/16 1140 (!) 55     Resp 05/14/16 1140 12     Temp 05/14/16 1140 98 F (36.7 C)     Temp Source 05/14/16 1140 Oral     SpO2 05/14/16 1140 96 %     Weight 05/14/16 1135 210 lb (95.3 kg)     Height 05/14/16 1135 5\' 2"  (1.575 m)     Head Circumference --      Peak Flow --      Pain Score 05/14/16 1134 0    Constitutional: Alert and oriented. Well appearing and in no distress. Eyes: Conjunctivae are normal. Normal extraocular movements. ENT   Head: Normocephalic and atraumatic.   Nose: No congestion/rhinnorhea.   Mouth/Throat: Mucous membranes are moist.   Neck: No stridor. Hematological/Lymphatic/Immunilogical: No cervical lymphadenopathy. Cardiovascular: Bradycardic, regular rhythm.  No murmurs, rubs, or gallops.  Respiratory: Normal respiratory effort without tachypnea nor retractions. Breath sounds are clear and equal bilaterally. No wheezes/rales/rhonchi. Gastrointestinal: Soft and non tender. No rebound. No guarding.  Genitourinary: Deferred Musculoskeletal: Normal range of motion in all extremities. No lower extremity edema. Neurologic:  Normal speech and language. No gross focal neurologic deficits are appreciated.  Skin:  Skin is warm, dry and intact. No rash noted. Psychiatric: Mood and affect are normal. Speech and behavior are normal. Patient exhibits appropriate insight and judgment.  ____________________________________________    LABS (pertinent positives/negatives)  Labs Reviewed  BASIC METABOLIC PANEL - Abnormal; Notable for the following:       Result Value   Calcium 8.6 (*)    GFR calc non Af Amer 52 (*)    GFR calc Af Amer 60 (*)    All other components within normal limits  CBC  GLUCOSE, CAPILLARY  TROPONIN I  TROPONIN I  URINALYSIS, COMPLETE (UACMP) WITH MICROSCOPIC  CBG MONITORING, ED     ____________________________________________   EKG  I, Nance Pear, attending physician,  personally viewed and interpreted this EKG  EKG Time: 1137 Rate: 55 Rhythm: sinus bradycardia Axis: normal Intervals: qtc 483 QRS: LBBB ST changes: no st elevation Impression: abnormal ekg   ____________________________________________    RADIOLOGY  None  ____________________________________________   PROCEDURES  Procedures  ____________________________________________   INITIAL IMPRESSION / ASSESSMENT AND PLAN / ED COURSE  Pertinent labs & imaging results that were available during my care of the patient were reviewed by me and considered in my medical decision making (see chart for details).  Patient presented to the emergency department today because of concerns for a near syncopal episode. Says occurs shortly after the patient use the restroom. On exam here the patient is somewhat bradycardic however no acute distress. Heart: Sounds normal limits. Will check blood work including troponin. If first troponin negative at to feel patient will likely benefit from a  second troponin. ----------------------------------------- 1:15 PM on 05/14/2016 -----------------------------------------   OBSERVATION CARE: This patient is being placed under observation care for the following reasons: near syncope to retest to rule out ischemia  ----------------------------------------- 2:02 PM on 05/14/2016 -----------------------------------------  She continues to do okay. Denies any dizziness or chest pain at this time. Did get up to use the bathroom and said she felt okay with that. Awaiting second troponin.  ----------------------------------------- 3:44 PM on 05/14/2016 -----------------------------------------   END OF OBSERVATION STATUS: After an appropriate period of observation, this patient is being discharged due to the following reason(s):  Second negative troponin. Abscence of symptoms. I did discuss return precautions with patient. Advised patient to follow up with  both primary care and her cardiologist.  ____________________________________________   FINAL CLINICAL IMPRESSION(S) / ED DIAGNOSES  Final diagnoses:  Near syncope     Note: This dictation was prepared with Dragon dictation. Any transcriptional errors that result from this process are unintentional     Nance Pear, MD 05/14/16 1549

## 2016-05-23 DIAGNOSIS — Z96641 Presence of right artificial hip joint: Secondary | ICD-10-CM | POA: Insufficient documentation

## 2016-08-23 ENCOUNTER — Other Ambulatory Visit: Payer: Self-pay | Admitting: Orthopedic Surgery

## 2016-08-23 DIAGNOSIS — R269 Unspecified abnormalities of gait and mobility: Secondary | ICD-10-CM

## 2016-08-23 DIAGNOSIS — M546 Pain in thoracic spine: Secondary | ICD-10-CM

## 2016-09-06 ENCOUNTER — Ambulatory Visit
Admission: RE | Admit: 2016-09-06 | Discharge: 2016-09-06 | Disposition: A | Discharge status: Home / Self Care | Payer: Medicare Other | Source: Ambulatory Visit | Attending: Orthopedic Surgery | Admitting: Orthopedic Surgery

## 2016-09-06 DIAGNOSIS — M546 Pain in thoracic spine: Secondary | ICD-10-CM

## 2016-09-06 DIAGNOSIS — R269 Unspecified abnormalities of gait and mobility: Secondary | ICD-10-CM

## 2017-03-05 DIAGNOSIS — M5412 Radiculopathy, cervical region: Secondary | ICD-10-CM | POA: Insufficient documentation

## 2017-03-25 ENCOUNTER — Other Ambulatory Visit: Payer: Self-pay | Admitting: Family Medicine

## 2017-03-25 DIAGNOSIS — N6320 Unspecified lump in the left breast, unspecified quadrant: Secondary | ICD-10-CM

## 2017-05-28 DIAGNOSIS — Z9889 Other specified postprocedural states: Secondary | ICD-10-CM | POA: Insufficient documentation

## 2017-06-14 IMAGING — CT CT CHEST W/O CM
1 series · 15 of 33 positions shown, 19 images · non-contrast
Comparison: 01/27/2014.

CLINICAL DATA: Follow-up abnormal chest CT 01/27/2014. Left breast
cancer.

EXAM:
CT CHEST WITHOUT CONTRAST
TECHNIQUE: Multidetector CT imaging of the chest was performed following the
standard protocol without IV contrast.

[Series 2: thorax · axial · 0.73mm/px · z∈[-561,-311]mm · 15 of 60 slices shown, 19 images]
[im 5/60  mediastinal]
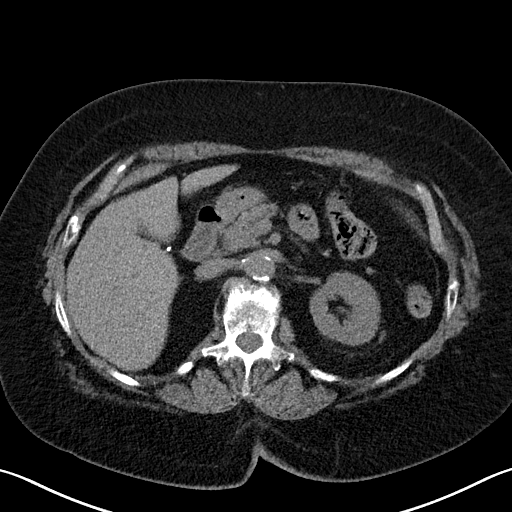
[im 5/60  lung]
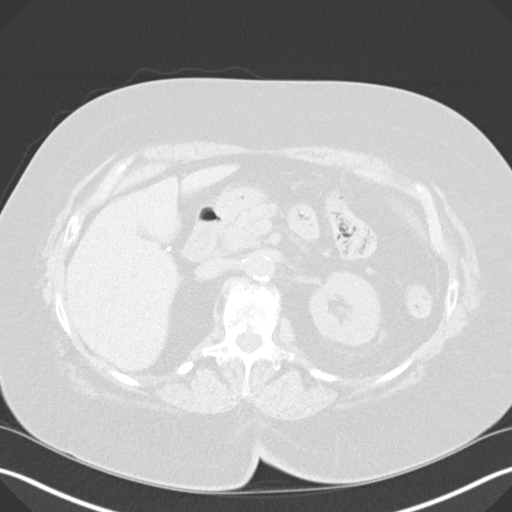
[im 9/60  lung]
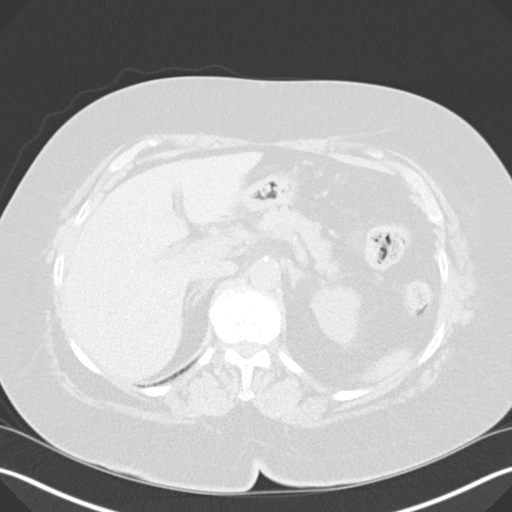
[im 12/60  lung]
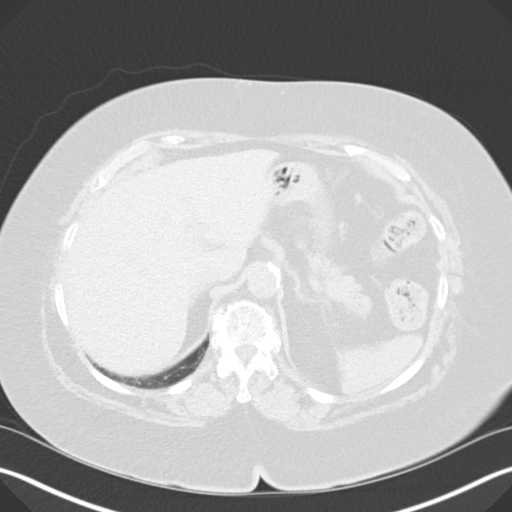
[im 16/60  lung]
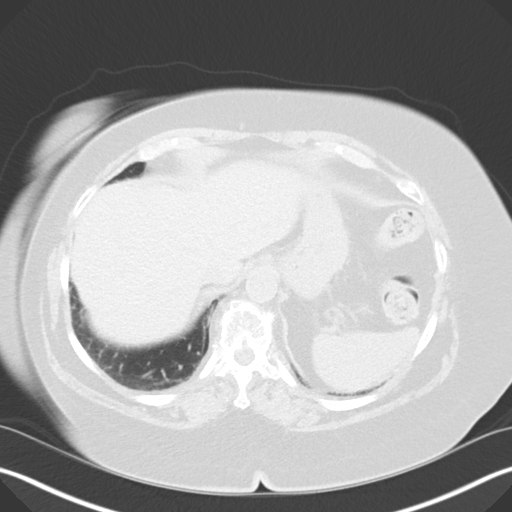
[im 20/60  mediastinal]
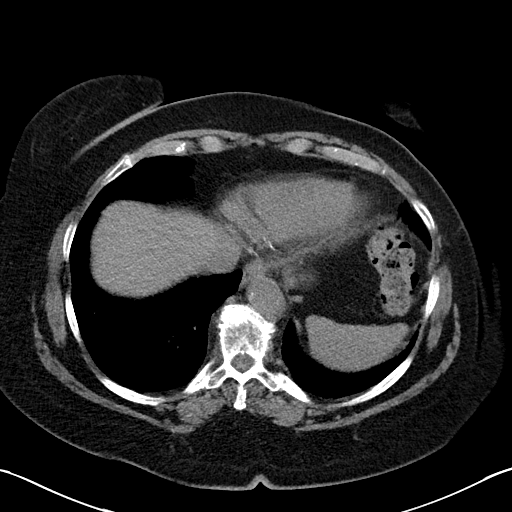
[im 20/60  lung]
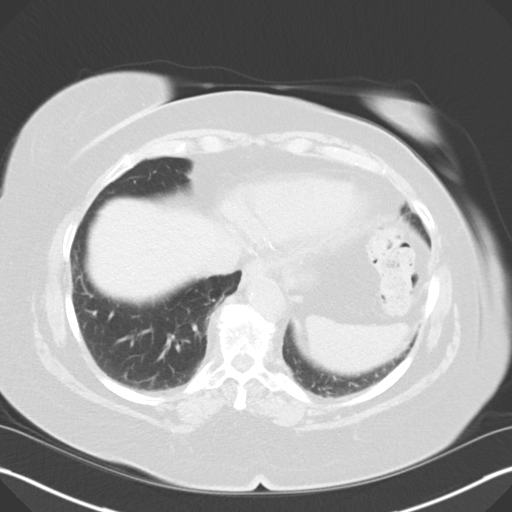
[im 24/60  lung]
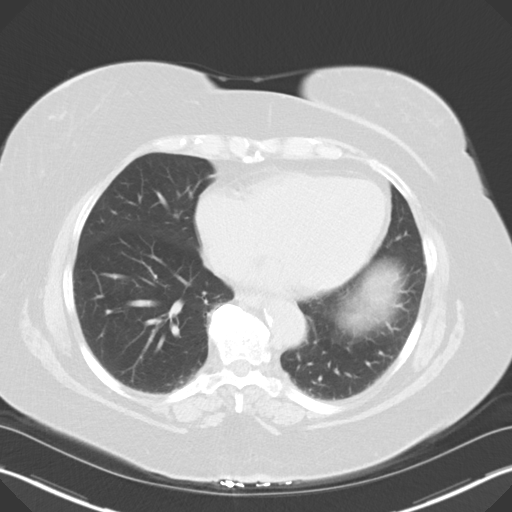
[im 27/60  lung]
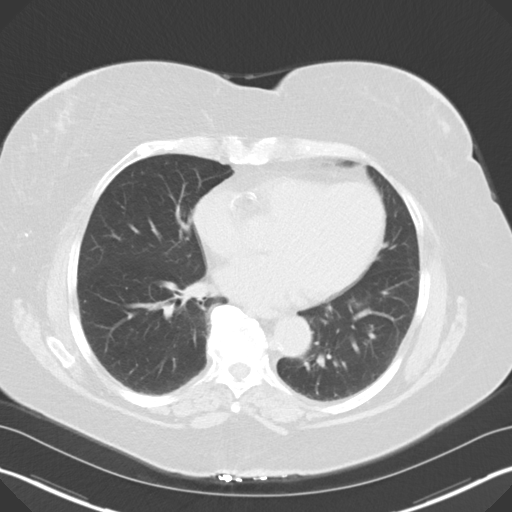
[im 31/60  lung]
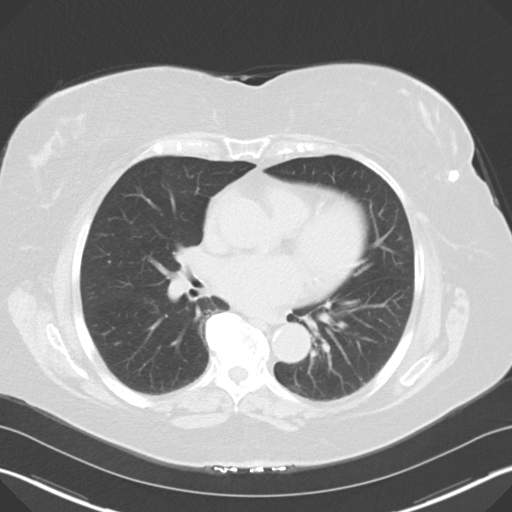
[im 33/60  mediastinal]
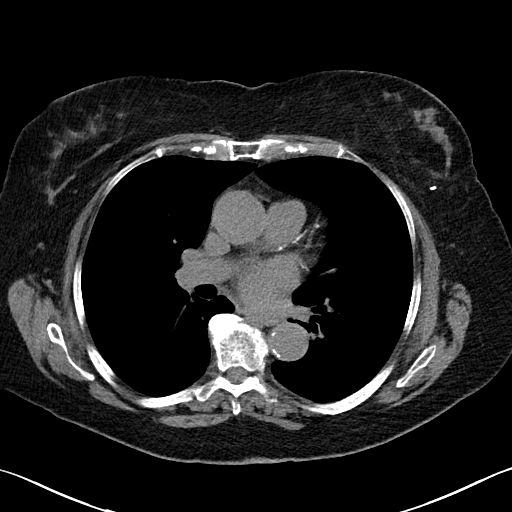
[im 33/60  lung]
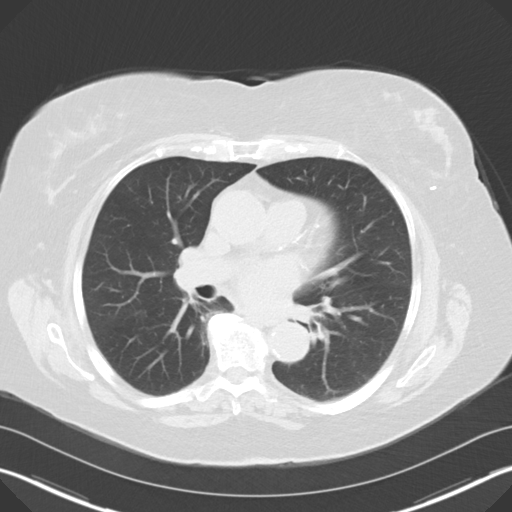
[im 36/60  lung]
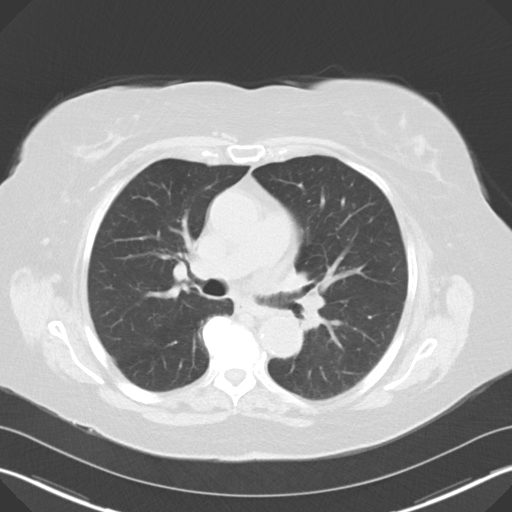
[im 40/60  lung]
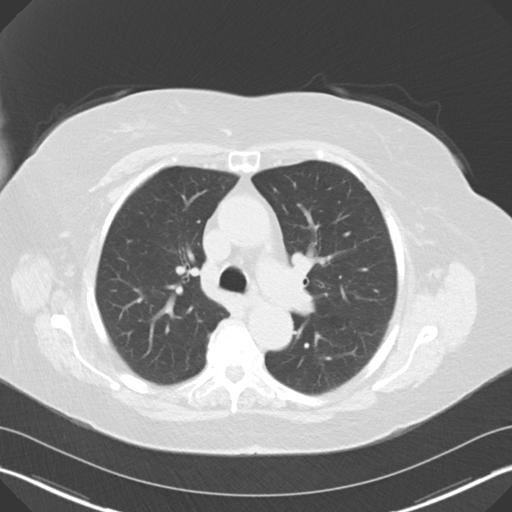
[im 44/60  lung]
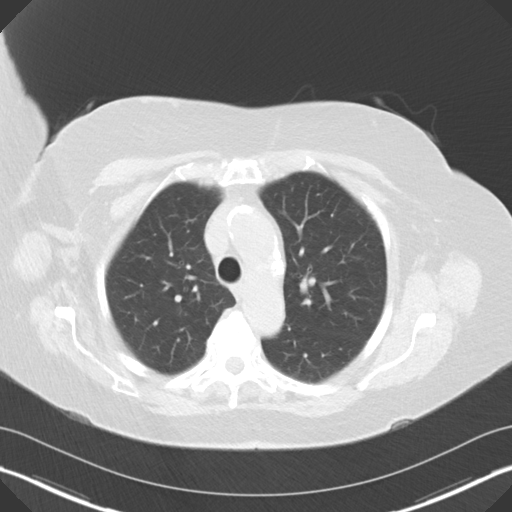
[im 48/60  mediastinal]
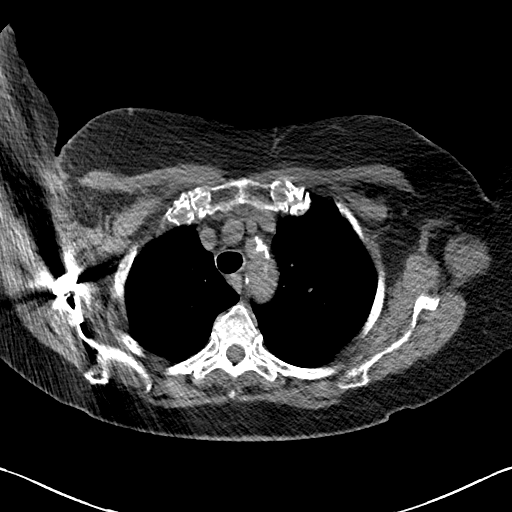
[im 48/60  lung]
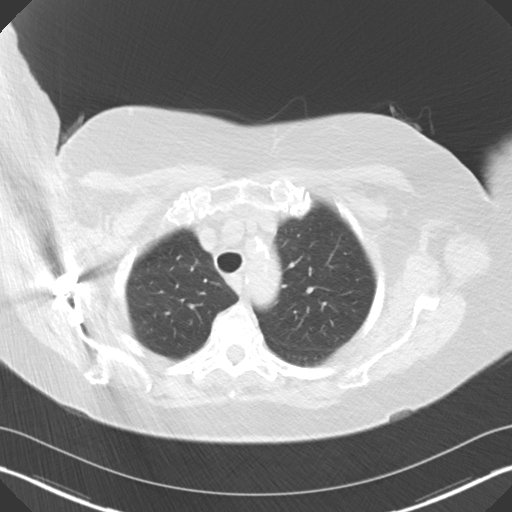
[im 51/60  lung]
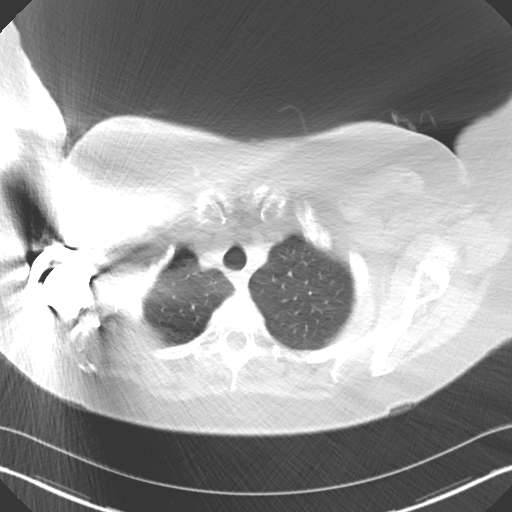
[im 55/60  lung]
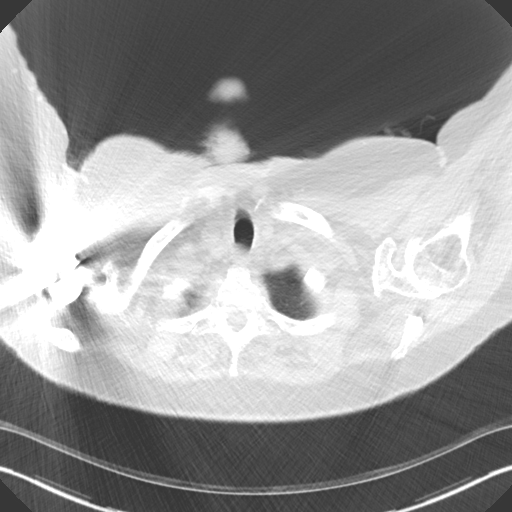

[15 of 33 positions shown; findings below may reference images not displayed]

FINDINGS: Mediastinum/Nodes: No pathologically enlarged mediastinal or
axillary lymph nodes. Surgical clips in the left axilla. Hilar
regions are difficult to definitively evaluate without IV contrast.
Three-vessel coronary artery calcification. Heart size normal. No
pericardial effusion.

Lungs/Pleura: Scattered tiny pulmonary nodules described on the
prior exam are less well seen today. Lungs are otherwise clear. No
pleural fluid. Airway is unremarkable.

Upper abdomen: Visualized portions of the liver, adrenal glands,
kidneys, spleen, pancreas, stomach and bowel are grossly
unremarkable. No upper abdominal adenopathy. Cholecystectomy.

Musculoskeletal: No worrisome lytic or sclerotic lesions.
Degenerative changes are seen in the spine. Right shoulder
arthroplasty. Anchors are seen in the left humeral head.
IMPRESSION: 1. No acute findings.
2. Three-vessel coronary artery calcification.

## 2017-07-21 ENCOUNTER — Ambulatory Visit: Payer: Medicare Other | Admitting: Occupational Therapy

## 2018-02-11 NOTE — Discharge Instructions (Signed)

## 2018-02-12 ENCOUNTER — Other Ambulatory Visit: Payer: Self-pay

## 2018-02-17 NOTE — Anesthesia Preprocedure Evaluation (Addendum)
Anesthesia Evaluation  Patient identified by MRN, date of birth, ID band  Reviewed: NPO status   History of Anesthesia Complications Negative for: history of anesthetic complications  Airway Mallampati: II  TM Distance: >3 FB Neck ROM: full    Dental no notable dental hx. (+) Lower Dentures, Upper Dentures   Pulmonary neg pulmonary ROS, former smoker,    Pulmonary exam normal        Cardiovascular Exercise Tolerance: Good hypertension, + CAD  Normal cardiovascular exam    Echocardiogram (03/07/2017 - EF 40%, normal RV function, moderate TR, peak RVSP 41 mmHg) Pharmacological stress test (03/10/2017 - no evidence of ischemia, LVEF 68%)   cards stable: 08/2017: kowalski     1. Bilateral carotid artery stenosis2. Bundle branch block, left 3. Coronary artery disease involving native coronary artery of native heart without angina pectoris 4. Essential hypertension 5. Bradycardia 6. Dizziness     Neuro/Psych Anxiety negative neurological ROS  negative psych ROS   GI/Hepatic negative GI ROS, Neg liver ROS,   Endo/Other  Hypothyroidism Morbid obesity (bmi=40)  Renal/GU negative Renal ROS  negative genitourinary   Musculoskeletal   Abdominal   Peds  Hematology L breast Cancer > lumpectomy;   Anesthesia Other Findings stress echo: 03/2017: LVEF= 68%Regional wall motion: reveals normal myocardial thickening and wall motion.The overall quality of the study is good. Artifacts noted: noLeft ventricular cavity: normal.Perfusion Analysis: SPECT images demonstrate homogeneous tracer distribution throughout the myocardium.   YT=01% MILD LV SYSTOLIC DYSFUNCTION (See above)NORMAL RIGHT VENTRICULAR SYSTOLIC FUNCTIONMODERATE VALVULAR REGURGITATION NO VALVULAR STENOSIS;  had hand surgery on jan 2020.   Reproductive/Obstetrics                            Anesthesia Physical Anesthesia Plan  ASA:  II  Anesthesia Plan: MAC   Post-op Pain Management:    Induction:   PONV Risk Score and Plan:   Airway Management Planned:   Additional Equipment:   Intra-op Plan:   Post-operative Plan:   Informed Consent: I have reviewed the patients History and Physical, chart, labs and discussed the procedure including the risks, benefits and alternatives for the proposed anesthesia with the patient or authorized representative who has indicated his/her understanding and acceptance.       Plan Discussed with: CRNA  Anesthesia Plan Comments:         Anesthesia Quick Evaluation

## 2018-02-18 ENCOUNTER — Ambulatory Visit
Admission: RE | Admit: 2018-02-18 | Discharge: 2018-02-18 | Disposition: A | Discharge status: Home / Self Care | Payer: Medicare Other | Attending: Ophthalmology | Admitting: Ophthalmology

## 2018-02-18 ENCOUNTER — Ambulatory Visit: Payer: Medicare Other | Admitting: Anesthesiology

## 2018-02-18 ENCOUNTER — Encounter
Admission: RE | Disposition: A | Discharge status: Home / Self Care | Payer: Self-pay | Source: Home / Self Care | Attending: Ophthalmology

## 2018-02-18 DIAGNOSIS — I25119 Atherosclerotic heart disease of native coronary artery with unspecified angina pectoris: Secondary | ICD-10-CM | POA: Insufficient documentation

## 2018-02-18 DIAGNOSIS — Z87891 Personal history of nicotine dependence: Secondary | ICD-10-CM | POA: Insufficient documentation

## 2018-02-18 DIAGNOSIS — I6523 Occlusion and stenosis of bilateral carotid arteries: Secondary | ICD-10-CM | POA: Insufficient documentation

## 2018-02-18 DIAGNOSIS — F419 Anxiety disorder, unspecified: Secondary | ICD-10-CM | POA: Diagnosis not present

## 2018-02-18 DIAGNOSIS — Z853 Personal history of malignant neoplasm of breast: Secondary | ICD-10-CM | POA: Diagnosis not present

## 2018-02-18 DIAGNOSIS — R001 Bradycardia, unspecified: Secondary | ICD-10-CM | POA: Insufficient documentation

## 2018-02-18 DIAGNOSIS — Z6841 Body Mass Index (BMI) 40.0 and over, adult: Secondary | ICD-10-CM | POA: Diagnosis not present

## 2018-02-18 DIAGNOSIS — I447 Left bundle-branch block, unspecified: Secondary | ICD-10-CM | POA: Insufficient documentation

## 2018-02-18 DIAGNOSIS — I10 Essential (primary) hypertension: Secondary | ICD-10-CM | POA: Diagnosis not present

## 2018-02-18 DIAGNOSIS — H2512 Age-related nuclear cataract, left eye: Secondary | ICD-10-CM | POA: Diagnosis not present

## 2018-02-18 DIAGNOSIS — E039 Hypothyroidism, unspecified: Secondary | ICD-10-CM | POA: Diagnosis not present

## 2018-02-18 DIAGNOSIS — R42 Dizziness and giddiness: Secondary | ICD-10-CM | POA: Insufficient documentation

## 2018-02-18 HISTORY — PX: CATARACT EXTRACTION W/PHACO: SHX586

## 2018-02-18 SURGERY — PHACOEMULSIFICATION, CATARACT, WITH IOL INSERTION
Anesthesia: Monitor Anesthesia Care | Site: Eye | Laterality: Left

## 2018-02-18 MED ORDER — BRIMONIDINE TARTRATE-TIMOLOL 0.2-0.5 % OP SOLN
OPHTHALMIC | Status: DC | PRN
  Administered 2018-02-18: 1 [drp] via OPHTHALMIC

## 2018-02-18 MED ORDER — NA HYALUR & NA CHOND-NA HYALUR 0.4-0.35 ML IO KIT
PACK | INTRAOCULAR | Status: DC | PRN
  Administered 2018-02-18: 1 mL via INTRAOCULAR

## 2018-02-18 MED ORDER — MOXIFLOXACIN HCL 0.5 % OP SOLN
OPHTHALMIC | Status: DC | PRN
  Administered 2018-02-18: 0.2 mL via OPHTHALMIC

## 2018-02-18 MED ORDER — TETRACAINE HCL 0.5 % OP SOLN
1.0000 [drp] | OPHTHALMIC | Status: DC | PRN
  Administered 2018-02-18 (×2): 1 [drp] via OPHTHALMIC

## 2018-02-18 MED ORDER — ARMC OPHTHALMIC DILATING DROPS
1.0000 "application " | OPHTHALMIC | Status: DC | PRN
  Administered 2018-02-18 (×3): 1 via OPHTHALMIC

## 2018-02-18 MED ORDER — MIDAZOLAM HCL 2 MG/2ML IJ SOLN
INTRAMUSCULAR | Status: DC | PRN
  Administered 2018-02-18: 2 mg via INTRAVENOUS

## 2018-02-18 MED ORDER — LACTATED RINGERS IV SOLN: INTRAVENOUS | Status: DC

## 2018-02-18 MED ORDER — LIDOCAINE HCL (PF) 2 % IJ SOLN
INTRAOCULAR | Status: DC | PRN
  Administered 2018-02-18: 1 mL via INTRAMUSCULAR

## 2018-02-18 MED ORDER — FENTANYL CITRATE (PF) 100 MCG/2ML IJ SOLN
INTRAMUSCULAR | Status: DC | PRN
  Administered 2018-02-18 (×2): 50 ug via INTRAVENOUS

## 2018-02-18 MED ORDER — EPINEPHRINE PF 1 MG/ML IJ SOLN
INTRAOCULAR | Status: DC | PRN
  Administered 2018-02-18: 70 mL via OPHTHALMIC

## 2018-02-18 MED ORDER — MOXIFLOXACIN HCL 0.5 % OP SOLN
1.0000 [drp] | OPHTHALMIC | Status: DC | PRN
  Administered 2018-02-18 (×3): 1 [drp] via OPHTHALMIC

## 2018-02-18 SURGICAL SUPPLY — 20 items
CANNULA ANT/CHMB 27G (MISCELLANEOUS) ×1 IMPLANT
CANNULA ANT/CHMB 27GA (MISCELLANEOUS) ×2 IMPLANT
GLOVE SURG LX 7.5 STRW (GLOVE) ×1
GLOVE SURG LX STRL 7.5 STRW (GLOVE) ×1 IMPLANT
GLOVE SURG TRIUMPH 8.0 PF LTX (GLOVE) ×2 IMPLANT
GOWN STRL REUS W/ TWL LRG LVL3 (GOWN DISPOSABLE) ×2 IMPLANT
GOWN STRL REUS W/TWL LRG LVL3 (GOWN DISPOSABLE) ×4
LENS IOL TECNIS ITEC 21.5 (Intraocular Lens) ×1 IMPLANT
MARKER SKIN DUAL TIP RULER LAB (MISCELLANEOUS) ×2 IMPLANT
NDL FILTER BLUNT 18X1 1/2 (NEEDLE) ×1 IMPLANT
NEEDLE FILTER BLUNT 18X 1/2SAF (NEEDLE) ×1
NEEDLE FILTER BLUNT 18X1 1/2 (NEEDLE) ×1 IMPLANT
PACK CATARACT BRASINGTON (MISCELLANEOUS) ×2 IMPLANT
PACK EYE AFTER SURG (MISCELLANEOUS) ×2 IMPLANT
PACK OPTHALMIC (MISCELLANEOUS) ×2 IMPLANT
SYR 3ML LL SCALE MARK (SYRINGE) ×2 IMPLANT
SYR 5ML LL (SYRINGE) ×2 IMPLANT
SYR TB 1ML LUER SLIP (SYRINGE) ×2 IMPLANT
WATER STERILE IRR 500ML POUR (IV SOLUTION) ×2 IMPLANT
WIPE NON LINTING 3.25X3.25 (MISCELLANEOUS) ×2 IMPLANT

## 2018-02-18 NOTE — Anesthesia Postprocedure Evaluation (Signed)
Anesthesia Post Note  Patient: Cynthia Raymond  Procedure(s) Performed: CATARACT EXTRACTION PHACO AND INTRAOCULAR LENS PLACEMENT ([place]) LEFT (Left Eye)  Patient location during evaluation: PACU Anesthesia Type: MAC Level of consciousness: awake and alert Pain management: pain level controlled Vital Signs Assessment: post-procedure vital signs reviewed and stable Respiratory status: spontaneous breathing, nonlabored ventilation, respiratory function stable and patient connected to nasal cannula oxygen Cardiovascular status: stable and blood pressure returned to baseline Postop Assessment: no apparent nausea or vomiting Anesthetic complications: no    Sonita Michiels

## 2018-02-18 NOTE — Transfer of Care (Signed)
Immediate Anesthesia Transfer of Care Note  Patient: Cynthia Raymond  Procedure(s) Performed: CATARACT EXTRACTION PHACO AND INTRAOCULAR LENS PLACEMENT ([facility]) LEFT (Left Eye)  Patient Location: PACU  Anesthesia Type: MAC  Level of Consciousness: awake, alert  and patient cooperative  Airway and Oxygen Therapy: Patient Spontanous Breathing and Patient connected to supplemental oxygen  Post-op Assessment: Post-op Vital signs reviewed, Patient's Cardiovascular Status Stable, Respiratory Function Stable, Patent Airway and No signs of Nausea or vomiting  Post-op Vital Signs: Reviewed and stable  Complications: No apparent anesthesia complications

## 2018-02-18 NOTE — Op Note (Signed)
OPERATIVE NOTE  Cortasia Screws [id number] 02/18/2018   PREOPERATIVE DIAGNOSIS:  Nuclear sclerotic cataract left eye. H25.12   POSTOPERATIVE DIAGNOSIS:    Nuclear sclerotic cataract left eye.     PROCEDURE:  Phacoemusification with posterior chamber intraocular lens placement of the left eye   LENS:   Implant Name Type Inv. Item Serial No. Manufacturer Lot No. LRB No. Used  LENS IOL DIOP 21.5 - [id number] Intraocular Lens LENS IOL DIOP 21.5 [id number] AMO  Left 1        ULTRASOUND TIME: 16  % of 1 minutes 10 seconds, CDE 11.5  SURGEON:  Wyonia Hough, MD   ANESTHESIA:  Topical with tetracaine drops and 2% Xylocaine jelly, augmented with 1% preservative-free intracameral lidocaine.    COMPLICATIONS:  None.   DESCRIPTION OF PROCEDURE:  The patient was identified in the holding room and transported to the operating room and placed in the supine position under the operating microscope.  The left eye was identified as the operative eye and it was prepped and draped in the usual sterile ophthalmic fashion.   A 1 millimeter clear-corneal paracentesis was made at the 1:30 position.  0.5 ml of preservative-free 1% lidocaine was injected into the anterior chamber.  The anterior chamber was filled with Viscoat viscoelastic.  A 2.4 millimeter keratome was used to make a near-clear corneal incision at the 10:30 position.  .  A curvilinear capsulorrhexis was made with a cystotome and capsulorrhexis forceps.  Balanced salt solution was used to hydrodissect and hydrodelineate the nucleus.   Phacoemulsification was then used in stop and chop fashion to remove the lens nucleus and epinucleus.  The remaining cortex was then removed using the irrigation and aspiration handpiece. Provisc was then placed into the capsular bag to distend it for lens placement.  A lens was then injected into the capsular bag.  The remaining viscoelastic was aspirated.   Wounds were hydrated with balanced salt  solution.  The anterior chamber was inflated to a physiologic pressure with balanced salt solution.  No wound leaks were noted. Vigamox 0.2 ml of a 1mg  per ml solution was injected into the anterior chamber for a dose of 0.2 mg of intracameral antibiotic at the completion of the case.   Timolol and Brimonidine drops were applied to the eye.  The patient was taken to the recovery room in stable condition without complications of anesthesia or surgery.  Farooq Petrovich 02/18/2018, 7:59 AM

## 2018-02-18 NOTE — H&P (Signed)

## 2018-02-19 ENCOUNTER — Encounter: Payer: Self-pay | Admitting: Ophthalmology

## 2018-03-05 ENCOUNTER — Other Ambulatory Visit: Payer: Self-pay

## 2018-03-05 ENCOUNTER — Encounter: Payer: Self-pay | Admitting: *Deleted

## 2018-03-12 NOTE — Discharge Instructions (Signed)

## 2018-03-17 ENCOUNTER — Ambulatory Visit: Payer: Medicare Other | Admitting: Anesthesiology

## 2018-03-17 ENCOUNTER — Encounter
Admission: RE | Disposition: A | Discharge status: Home / Self Care | Payer: Self-pay | Source: Home / Self Care | Attending: Ophthalmology

## 2018-03-17 ENCOUNTER — Ambulatory Visit
Admission: RE | Admit: 2018-03-17 | Discharge: 2018-03-17 | Disposition: A | Discharge status: Home / Self Care | Payer: Medicare Other | Attending: Ophthalmology | Admitting: Ophthalmology

## 2018-03-17 DIAGNOSIS — H2511 Age-related nuclear cataract, right eye: Secondary | ICD-10-CM | POA: Diagnosis not present

## 2018-03-17 DIAGNOSIS — I10 Essential (primary) hypertension: Secondary | ICD-10-CM | POA: Diagnosis not present

## 2018-03-17 DIAGNOSIS — Z880 Allergy status to penicillin: Secondary | ICD-10-CM | POA: Diagnosis not present

## 2018-03-17 DIAGNOSIS — Z885 Allergy status to narcotic agent status: Secondary | ICD-10-CM | POA: Diagnosis not present

## 2018-03-17 DIAGNOSIS — Z7952 Long term (current) use of systemic steroids: Secondary | ICD-10-CM | POA: Diagnosis not present

## 2018-03-17 DIAGNOSIS — Z96611 Presence of right artificial shoulder joint: Secondary | ICD-10-CM | POA: Insufficient documentation

## 2018-03-17 DIAGNOSIS — Z96653 Presence of artificial knee joint, bilateral: Secondary | ICD-10-CM | POA: Diagnosis not present

## 2018-03-17 DIAGNOSIS — Z888 Allergy status to other drugs, medicaments and biological substances status: Secondary | ICD-10-CM | POA: Diagnosis not present

## 2018-03-17 DIAGNOSIS — Z96641 Presence of right artificial hip joint: Secondary | ICD-10-CM | POA: Diagnosis not present

## 2018-03-17 DIAGNOSIS — Z882 Allergy status to sulfonamides status: Secondary | ICD-10-CM | POA: Diagnosis not present

## 2018-03-17 DIAGNOSIS — Z853 Personal history of malignant neoplasm of breast: Secondary | ICD-10-CM | POA: Diagnosis not present

## 2018-03-17 DIAGNOSIS — J449 Chronic obstructive pulmonary disease, unspecified: Secondary | ICD-10-CM | POA: Diagnosis not present

## 2018-03-17 DIAGNOSIS — M1990 Unspecified osteoarthritis, unspecified site: Secondary | ICD-10-CM | POA: Diagnosis not present

## 2018-03-17 DIAGNOSIS — Z7982 Long term (current) use of aspirin: Secondary | ICD-10-CM | POA: Diagnosis not present

## 2018-03-17 DIAGNOSIS — Z79899 Other long term (current) drug therapy: Secondary | ICD-10-CM | POA: Diagnosis not present

## 2018-03-17 DIAGNOSIS — Z87891 Personal history of nicotine dependence: Secondary | ICD-10-CM | POA: Diagnosis not present

## 2018-03-17 DIAGNOSIS — Z79890 Hormone replacement therapy (postmenopausal): Secondary | ICD-10-CM | POA: Insufficient documentation

## 2018-03-17 HISTORY — PX: CATARACT EXTRACTION W/PHACO: SHX586

## 2018-03-17 SURGERY — PHACOEMULSIFICATION, CATARACT, WITH IOL INSERTION
Anesthesia: Monitor Anesthesia Care | Site: Eye | Laterality: Right

## 2018-03-17 MED ORDER — ACETAMINOPHEN 160 MG/5ML PO SOLN: 325.0000 mg | ORAL | Status: DC | PRN

## 2018-03-17 MED ORDER — MIDAZOLAM HCL 2 MG/2ML IJ SOLN
INTRAMUSCULAR | Status: DC | PRN
  Administered 2018-03-17 (×2): 1 mg via INTRAVENOUS

## 2018-03-17 MED ORDER — FENTANYL CITRATE (PF) 100 MCG/2ML IJ SOLN
INTRAMUSCULAR | Status: DC | PRN
  Administered 2018-03-17 (×2): 50 ug via INTRAVENOUS

## 2018-03-17 MED ORDER — BRIMONIDINE TARTRATE-TIMOLOL 0.2-0.5 % OP SOLN
OPHTHALMIC | Status: DC | PRN
  Administered 2018-03-17: 1 [drp] via OPHTHALMIC

## 2018-03-17 MED ORDER — MOXIFLOXACIN HCL 0.5 % OP SOLN
1.0000 [drp] | OPHTHALMIC | Status: DC | PRN
  Administered 2018-03-17 (×3): 1 [drp] via OPHTHALMIC

## 2018-03-17 MED ORDER — ACETAMINOPHEN 325 MG PO TABS: 325.0000 mg | ORAL_TABLET | ORAL | Status: DC | PRN

## 2018-03-17 MED ORDER — EPINEPHRINE PF 1 MG/ML IJ SOLN
INTRAOCULAR | Status: DC | PRN
  Administered 2018-03-17: 47 mL via OPHTHALMIC

## 2018-03-17 MED ORDER — ARMC OPHTHALMIC DILATING DROPS
1.0000 "application " | OPHTHALMIC | Status: DC | PRN
  Administered 2018-03-17 (×3): 1 via OPHTHALMIC

## 2018-03-17 MED ORDER — LACTATED RINGERS IV SOLN: INTRAVENOUS | Status: DC

## 2018-03-17 MED ORDER — MOXIFLOXACIN HCL 0.5 % OP SOLN
OPHTHALMIC | Status: DC | PRN
  Administered 2018-03-17: 0.2 mL via OPHTHALMIC

## 2018-03-17 MED ORDER — LIDOCAINE HCL (PF) 2 % IJ SOLN
INTRAOCULAR | Status: DC | PRN
  Administered 2018-03-17: 2 mL

## 2018-03-17 MED ORDER — TETRACAINE HCL 0.5 % OP SOLN
1.0000 [drp] | OPHTHALMIC | Status: DC | PRN
  Administered 2018-03-17 (×2): 1 [drp] via OPHTHALMIC

## 2018-03-17 MED ORDER — NA HYALUR & NA CHOND-NA HYALUR 0.4-0.35 ML IO KIT
PACK | INTRAOCULAR | Status: DC | PRN
  Administered 2018-03-17: 1 mL via INTRAOCULAR

## 2018-03-17 SURGICAL SUPPLY — 20 items
CANNULA ANT/CHMB 27G (MISCELLANEOUS) ×1 IMPLANT
CANNULA ANT/CHMB 27GA (MISCELLANEOUS) ×2 IMPLANT
GLOVE SURG LX 7.5 STRW (GLOVE) ×1
GLOVE SURG LX STRL 7.5 STRW (GLOVE) ×1 IMPLANT
GLOVE SURG TRIUMPH 8.0 PF LTX (GLOVE) ×2 IMPLANT
GOWN STRL REUS W/ TWL LRG LVL3 (GOWN DISPOSABLE) ×2 IMPLANT
GOWN STRL REUS W/TWL LRG LVL3 (GOWN DISPOSABLE) ×4
LENS IOL TECNIS ITEC 22.0 (Intraocular Lens) ×1 IMPLANT
MARKER SKIN DUAL TIP RULER LAB (MISCELLANEOUS) ×2 IMPLANT
NDL FILTER BLUNT 18X1 1/2 (NEEDLE) ×1 IMPLANT
NEEDLE FILTER BLUNT 18X 1/2SAF (NEEDLE) ×1
NEEDLE FILTER BLUNT 18X1 1/2 (NEEDLE) ×1 IMPLANT
PACK CATARACT BRASINGTON (MISCELLANEOUS) ×2 IMPLANT
PACK EYE AFTER SURG (MISCELLANEOUS) ×2 IMPLANT
PACK OPTHALMIC (MISCELLANEOUS) ×2 IMPLANT
SYR 3ML LL SCALE MARK (SYRINGE) ×2 IMPLANT
SYR 5ML LL (SYRINGE) ×2 IMPLANT
SYR TB 1ML LUER SLIP (SYRINGE) ×2 IMPLANT
WATER STERILE IRR 500ML POUR (IV SOLUTION) ×2 IMPLANT
WIPE NON LINTING 3.25X3.25 (MISCELLANEOUS) ×2 IMPLANT

## 2018-03-17 NOTE — Anesthesia Preprocedure Evaluation (Signed)
Anesthesia Evaluation  Patient identified by MRN, date of birth, ID band Patient awake    Reviewed: Allergy & Precautions, H&P , NPO status , Patient's Chart, lab work & pertinent test results, reviewed documented beta blocker date and time   Airway Mallampati: II  TM Distance: >3 FB Neck ROM: full    Dental no notable dental hx.    Pulmonary neg pulmonary ROS, former smoker,    Pulmonary exam normal breath sounds clear to auscultation       Cardiovascular Exercise Tolerance: Good hypertension,  Rhythm:regular Rate:Normal     Neuro/Psych negative neurological ROS  negative psych ROS   GI/Hepatic negative GI ROS, Neg liver ROS,   Endo/Other  Hyperthyroidism (graves disease)   Renal/GU negative Renal ROS  negative genitourinary   Musculoskeletal   Abdominal   Peds  Hematology negative hematology ROS (+)   Anesthesia Other Findings   Reproductive/Obstetrics negative OB ROS                             Anesthesia Physical Anesthesia Plan  ASA: III  Anesthesia Plan: MAC   Post-op Pain Management:    Induction:   PONV Risk Score and Plan:   Airway Management Planned:   Additional Equipment:   Intra-op Plan:   Post-operative Plan:   Informed Consent: I have reviewed the patients History and Physical, chart, labs and discussed the procedure including the risks, benefits and alternatives for the proposed anesthesia with the patient or authorized representative who has indicated his/her understanding and acceptance.     Dental Advisory Given  Plan Discussed with: CRNA  Anesthesia Plan Comments:         Anesthesia Quick Evaluation

## 2018-03-17 NOTE — Anesthesia Procedure Notes (Signed)
Procedure Name: MAC Date/Time: 03/17/2018 12:57 PM Performed by: Cameron Ali, CRNA Pre-anesthesia Checklist: Patient identified, Emergency Drugs available, Suction available, Timeout performed and Patient being monitored Patient Re-evaluated:Patient Re-evaluated prior to induction Oxygen Delivery Method: Nasal cannula Placement Confirmation: positive ETCO2

## 2018-03-17 NOTE — Anesthesia Postprocedure Evaluation (Signed)
Anesthesia Post Note  Patient: Cynthia Raymond  Procedure(s) Performed: CATARACT EXTRACTION PHACO AND INTRAOCULAR LENS PLACEMENT ([facility])  RIGHT (Right Eye)  Patient location during evaluation: PACU Anesthesia Type: MAC Level of consciousness: awake and alert Pain management: pain level controlled Vital Signs Assessment: post-procedure vital signs reviewed and stable Respiratory status: spontaneous breathing, nonlabored ventilation, respiratory function stable and patient connected to nasal cannula oxygen Cardiovascular status: stable and blood pressure returned to baseline Postop Assessment: no apparent nausea or vomiting Anesthetic complications: no    Alisa Graff

## 2018-03-17 NOTE — Op Note (Signed)
LOCATION:  [place]   PREOPERATIVE DIAGNOSIS:    Nuclear sclerotic cataract right eye. H25.11   POSTOPERATIVE DIAGNOSIS:  Nuclear sclerotic cataract right eye.     PROCEDURE:  Phacoemusification with posterior chamber intraocular lens placement of the right eye   LENS:   Implant Name Type Inv. Item Serial No. Manufacturer Lot No. LRB No. Used  LENS IOL DIOP 22.0 - [id number] Intraocular Lens LENS IOL DIOP 22.0 [id number] AMO  Right 1        ULTRASOUND TIME: 23 % of 0 minutes, 49 seconds.  CDE 11.5   SURGEON:  Wyonia Hough, MD   ANESTHESIA:  Topical with tetracaine drops and 2% Xylocaine jelly, augmented with 1% preservative-free intracameral lidocaine.    COMPLICATIONS:  None.   DESCRIPTION OF PROCEDURE:  The patient was identified in the holding room and transported to the operating room and placed in the supine position under the operating microscope.  The right eye was identified as the operative eye and it was prepped and draped in the usual sterile ophthalmic fashion.   A 1 millimeter clear-corneal paracentesis was made at the 12:00 position.  0.5 ml of preservative-free 1% lidocaine was injected into the anterior chamber. The anterior chamber was filled with Viscoat viscoelastic.  A 2.4 millimeter keratome was used to make a near-clear corneal incision at the 9:00 position.  A curvilinear capsulorrhexis was made with a cystotome and capsulorrhexis forceps.  Balanced salt solution was used to hydrodissect and hydrodelineate the nucleus.   Phacoemulsification was then used in stop and chop fashion to remove the lens nucleus and epinucleus.  The remaining cortex was then removed using the irrigation and aspiration handpiece. Provisc was then placed into the capsular bag to distend it for lens placement.  A lens was then injected into the capsular bag.  The remaining viscoelastic was aspirated.   Wounds were hydrated with balanced salt solution.  The anterior  chamber was inflated to a physiologic pressure with balanced salt solution.  No wound leaks were noted. Vigamox 0.2 ml of a 1mg  per ml solution was injected into the anterior chamber for a dose of 0.2 mg of intracameral antibiotic at the completion of the case.   Timolol and Brimonidine drops were applied to the eye.  The patient was taken to the recovery room in stable condition without complications of anesthesia or surgery.   Honor Fairbank 03/17/2018, 1:18 PM

## 2018-03-17 NOTE — Transfer of Care (Signed)
Immediate Anesthesia Transfer of Care Note  Patient: Cynthia Raymond  Procedure(s) Performed: CATARACT EXTRACTION PHACO AND INTRAOCULAR LENS PLACEMENT ([facility])  RIGHT (Right Eye)  Patient Location: PACU  Anesthesia Type: MAC  Level of Consciousness: awake, alert  and patient cooperative  Airway and Oxygen Therapy: Patient Spontanous Breathing and Patient connected to supplemental oxygen  Post-op Assessment: Post-op Vital signs reviewed, Patient's Cardiovascular Status Stable, Respiratory Function Stable, Patent Airway and No signs of Nausea or vomiting  Post-op Vital Signs: Reviewed and stable  Complications: No apparent anesthesia complications

## 2018-03-17 NOTE — H&P (Signed)

## 2018-03-18 ENCOUNTER — Encounter: Payer: Self-pay | Admitting: Ophthalmology

## 2018-03-26 ENCOUNTER — Ambulatory Visit: Payer: Medicare Other | Attending: Physician Assistant | Admitting: Occupational Therapy

## 2018-08-13 ENCOUNTER — Other Ambulatory Visit: Payer: Self-pay | Admitting: Family Medicine

## 2018-08-13 DIAGNOSIS — R42 Dizziness and giddiness: Secondary | ICD-10-CM

## 2018-08-17 ENCOUNTER — Other Ambulatory Visit: Payer: Self-pay | Admitting: Family Medicine

## 2018-08-17 DIAGNOSIS — R42 Dizziness and giddiness: Secondary | ICD-10-CM

## 2018-11-24 ENCOUNTER — Other Ambulatory Visit: Payer: Self-pay | Admitting: Neurology

## 2018-11-24 DIAGNOSIS — R519 Headache, unspecified: Secondary | ICD-10-CM

## 2018-12-01 ENCOUNTER — Other Ambulatory Visit: Payer: Self-pay

## 2018-12-01 ENCOUNTER — Ambulatory Visit
Admission: RE | Admit: 2018-12-01 | Discharge: 2018-12-01 | Disposition: A | Discharge status: Home / Self Care | Payer: Medicare Other | Source: Ambulatory Visit | Attending: Neurology | Admitting: Neurology

## 2018-12-01 DIAGNOSIS — R519 Headache, unspecified: Secondary | ICD-10-CM | POA: Diagnosis present

## 2018-12-24 ENCOUNTER — Ambulatory Visit: Payer: Medicare Other | Attending: Neurology

## 2018-12-30 ENCOUNTER — Ambulatory Visit: Payer: Medicare Other

## 2018-12-31 ENCOUNTER — Ambulatory Visit: Payer: Medicare Other

## 2019-01-07 ENCOUNTER — Ambulatory Visit: Payer: Medicare Other

## 2019-01-14 ENCOUNTER — Ambulatory Visit: Payer: Medicare Other

## 2019-01-21 ENCOUNTER — Encounter: Payer: Self-pay | Admitting: Physical Therapy

## 2019-01-21 ENCOUNTER — Ambulatory Visit: Payer: Medicare Other | Attending: Neurology | Admitting: Physical Therapy

## 2019-01-21 ENCOUNTER — Other Ambulatory Visit: Payer: Self-pay

## 2019-01-21 DIAGNOSIS — H8112 Benign paroxysmal vertigo, left ear: Secondary | ICD-10-CM | POA: Diagnosis present

## 2019-01-21 DIAGNOSIS — R2681 Unsteadiness on feet: Secondary | ICD-10-CM | POA: Insufficient documentation

## 2019-01-21 DIAGNOSIS — R42 Dizziness and giddiness: Secondary | ICD-10-CM | POA: Diagnosis present

## 2019-01-21 NOTE — Therapy (Addendum)
[facility] [address] [place], [place], [address] Phone: [phone]   Fax:  [phone]  Physical Therapy Evaluation  Patient Details  Name: Cynthia Raymond MRN: [id number] Date of Birth: 29-Jul-1929 Referring Provider (PT): Dr. Jennings Books   Encounter Date: 01/21/2019  PT End of Session - 01/22/19 0817    Visit Number  1    Number of Visits  13    Date for PT Re-Evaluation  04/15/19    PT Start Time  1510    PT Stop Time  1617    PT Time Calculation (min)  67 min    Activity Tolerance  Patient tolerated treatment well    Behavior During Therapy  [facility] for tasks assessed/performed       Past Medical History:  Diagnosis Date  . Cancer ([place])    breast  . Graves disease   . Hypertension   . Osteoporosis   . Thyroid disease     Past Surgical History:  Procedure Laterality Date  . BACK SURGERY     X3  . BREAST LUMPECTOMY    . CATARACT EXTRACTION W/PHACO Left 02/18/2018   Procedure: CATARACT EXTRACTION PHACO AND INTRAOCULAR LENS PLACEMENT ([place]) LEFT;  Surgeon: Leandrew Koyanagi, MD;  Location: [place];  Service: Ophthalmology;  Laterality: Left;  . CATARACT EXTRACTION W/PHACO Right 03/17/2018   Procedure: CATARACT EXTRACTION PHACO AND INTRAOCULAR LENS PLACEMENT ([place])  RIGHT;  Surgeon: Leandrew Koyanagi, MD;  Location: [place];  Service: Ophthalmology;  Laterality: Right;  . ELBOW SURGERY Right   . HAND SURGERY Right   . JOINT REPLACEMENT Bilateral    Both knees, left shoulder  . ROTATOR CUFF REPAIR Bilateral     There were no vitals filed for this visit.   Subjective Assessment - 01/22/19 1302    Subjective  Patient reports she has been getting daily episodes of vertigo that last seconds to minutes that started at the beginning of 2020.    Pertinent History  Patient reports she began having dizziness early last year in 2020 that has worsened. Patient's daughter reports that patient has  had poor balance for many years due to patient's back and hip issues. Patient describes her dizziness as vertigo, light-headedness upon standing, imbalance and unsteadiness especially with ambulation. Patient reports nausea at times due to dizziness. Patient reports that she veers when she walks. Patient reports multiple daily episodes of vertigo that last seconds to minutes. Patient reports rolling over in bed, getting into/out of bed, standing, quick movements, head turns, reaching up, looking up all provoke her symptoms of dizziness. Patient reports sitting and lying still help decrease her symptoms. Patient reports she tried Meclizine but reported that it did not help with the dizziness and made her sleepy therefore she stopped taking the Meclizine. Patient has not been seen by ENT or cardiology per MR. Patient had CT head that was negative for acute abnormality per MR. Patient with headache in right temporal region and diagnosis of right occipital neuralgia per medical record.    Diagnostic tests  CT head-no acute abnormatility    Patient Stated Goals  to have decreased dizziness; to be better able to get into/out of bed and chair       [facility] PT Assessment - 01/22/19 1237      Assessment   Medical Diagnosis  positional vertigo    Referring Provider (PT)  Dr. Jennings Books    Prior Therapy  none  Precautions   Precautions  Fall      Restrictions   Weight Bearing Restrictions  No      Balance Screen   Has the patient fallen in the past 6 months  No    Has the patient had a decrease in activity level because of a fear of falling?   Yes    Is the patient reluctant to leave their home because of a fear of falling?   No      Home Environment   Living Environment  Private residence    Living Arrangements  Alone    Available Help at Discharge  Family    Type of [place] to enter    Entrance Stairs-Number of Steps  2 and a threshold step    Entrance Stairs-Rails   Right;Left;Can reach both    [place]  One level    [place] - single point;Walker - 2 wheels      Prior Function   Level of Independence  --   limited community mobility with device     Cognition   Overall Cognitive Status  Within Functional Limits for tasks assessed        VESTIBULAR AND BALANCE EVALUATION HISTORY:  Subjective history of current problem: Patient reports she began having dizziness early last year in 2020 that has worsened. Patient's daughter reports that patient has had poor balance for many years due to patient's back and hip issues. Patient describes her dizziness as vertigo, light-headedness upon standing, imbalance and unsteadiness especially with ambulation. Patient reports nausea at times due to dizziness. Patient reports that she veers when she walks. Patient reports multiple daily episodes of vertigo that last seconds to minutes. Patient reports rolling over in bed, getting into/out of bed, standing, quick movements, head turns, reaching up, looking up all provoke her symptoms of dizziness. Patient reports sitting and lying still help decrease her symptoms. Patient reports she tried Meclizine but reported that it did not help with the dizziness and made her sleepy therefore she stopped taking the Meclizine. Patient has not been seen by ENT or cardiology per MR. Patient had CT head that was negative for acute abnormality per MR. Patient with headache in right temporal region and diagnosis of right occipital neuralgia per medical record.   Description of dizziness: vertigo, unsteadiness, lightheadedness Frequency: daily, multiple episodes Duration: seconds to minutes Symptom nature: motion provoked, positional, intermittent  Provocative Factors: lying down, turning head, quick movements, bending over Easing Factors: sitting and lying still  Progression of symptoms:  worse History of similar episodes: none  Falls (yes/no): no Number of falls in past  6 months: 0  Auditory complaints (tinnitus, pain, drainage): bilateral hearing loss, pain in right ear at times Vision (last eye exam, diplopia, recent changes): patient wears glasses    EXAMINATION  POSTURE:  Rounded shoulders, forward head  SOMATOSENSORY:  Deferred this date      COORDINATION: deferred  MUSCULOSKELETAL SCREEN: Cervical Spine ROM: Gross cervical AROM right and left rotation 0-45 degrees and decreased cervical extension.    Functional Mobility: Patient required CGA to Min A with sit to stand secondary to loss of balance with standing. Patient required min A with bed mobility skills.   Gait: Patient arrives ambulating with SPC. Patient ambulates with SPC with markedly decreased gait speed with evidence of imbalance with CGA to Min A secondary to multiple losses of balance. Recommended that patient use RW  at this time.  Scanning of visual environment with gait is: poor  Balance: Patient is challenged by ambulation, sit to stand, narrow base of support, head turns. Additional balance conditions to be assessed.  POSTURAL CONTROL TESTS:  Clinical Test of Sensory Interaction for Balance (CTSIB): deferred  OCULOMOTOR / VESTIBULAR TESTING: Oculomotor Exam- Room Light  Normal Abnormal Comments  Ocular Alignment N    Ocular ROM N    Spontaneous Nystagmus N    Gaze evoked Nystagmus   Deferred secondary to positive left Dix-Hallpike test  Smooth Pursuit  Abn Causing nausea and poorly able to tolerate  Saccades   Deferred secondary to positive left Dix-Hallpike test  VOR   Deferred secondary to positive left Dix-Hallpike test  VOR Cancellation   Deferred secondary to positive left Dix-Hallpike test  Left Head Impulse   Deferred secondary to positive left Dix-Hallpike test  Right Head Impulse   Deferred secondary to positive left Dix-Hallpike test    BPPV TESTS:  Symptoms Duration Intensity Nystagmus  Left Dix-Hallpike Vertigo/ nausea Less than 1 minute 7-8/10  Upbeating left torsional nystagmus of short duration  Right Dix-Hallpike None   None observed    FUNCTIONAL OUTCOME MEASURES:  Results Comments  DHI    68/100 Moderate perception of handicap; in need of intervention  ABC Scale      30% High Falls risk; in need of intervention  FOTO     30 out of 100 Given the patient's risk adjustment variables, like-patients nationally had a FS score of 47 at intake; patient's score indicates severity level of very severe; in need of intervention    Canalith Repositioning Manuever: On mat table, performed left Dix-Hallpike testing and it was positive with left upbeating torsional nystagmus of short duration without latency.  Performed left canalith repositioning maneuver (CRT).  Patient reported 7-8/10 vertigo with first maneuver. Patient declined to attempt a second maneuver this date. Patient with nausea and dizziness.  Performed right Dix-Hallpike test and it was negative with no nystagmus observed and patient denied vertigo.    PT Education - 01/22/19 0815    Education Details  discussed BPPV and Epley maneuver, discussed plan of care; recommended that patient use her RW due to falls risk; provided BPPV handout from vestibular.org    Person(s) Educated  Patient;Child(ren)    Methods  Handout;Verbal cues;Demonstration;Explanation;Tactile cues    Comprehension  Verbalized understanding;Verbal cues required;Returned demonstration;Tactile cues required       PT Short Term Goals - 01/22/19 0837      PT SHORT TERM GOAL #1   Title  Patient will report 25% or greater improvement in her symptoms of dizziness and imbalance with provoking motions or positions    Time  4    Period  Weeks    Status  New    Target Date  02/18/19        PT Long Term Goals - 01/22/19 0838      PT LONG TERM GOAL #1   Title  Patient will report 50% or greater improvement in her symptoms of dizziness and imbalance with provoking motions or positions    Time  12     Period  Weeks    Status  New    Target Date  04/15/19      PT LONG TERM GOAL #2   Title  Patient will reduce perceived disability to low levels as indicated by <40 on Dizziness Handicap Inventory.    Baseline  scored 68/100 on 01/22/2019  Time  12    Period  Weeks    Status  New    Target Date  04/15/19      PT LONG TERM GOAL #3   Title  Patient will reduce falls risk as indicated by improvement of 20% or more on Activities Specific Balance Confidence Scale (ABC) score to help reduce patient's falls risk.    Baseline  scored 30% on 01/22/2019    Time  12    Period  Weeks    Status  New    Target Date  04/15/19      PT LONG TERM GOAL #4   Title  Patient will report that she has moderate difficulty with getting into/out of bed and chair on the FOTO survey to indicate improve function with her ADLs.    Baseline  scored Limited a Lot on intake FOTO survey for these two items on 01/21/2019    Time  12    Period  Weeks    Status  New    Target Date  04/15/19             Plan - 01/22/19 0818    Clinical Impression Statement  Patient presents to clinic with report of unsteadiness, vertigo and lightheadedness that began in early 2020. Patient ambulates with SPC, but is very unsteady on her feet and recommended use of her RW at this time. Patient is at high risk of falls. Patient with positive left Dix-Hallpike test this date and performed one Epley maneuver. Patient has decreased neck ROM and chronic back and body pains and may have difficulty tolerating treatment maneuvers for L BPPV. Patient unable to tolerate full vestibulo-ocular testing this date due to nausea and dizziness with eye movement. Patient presents with listed functional deficits, is at high risk for falls and would benefit from PT services to address deficits, try to reduce falls risk and to address goals as set on plan of care.    Personal Factors and Comorbidities  Comorbidity 3+;Time since onset of  injury/illness/exacerbation    Comorbidities  CAD, depression, A-fib, HTN, Right occipital neuralgia and Grave's Disease    Examination-Activity Limitations  Bed Mobility;Bend;Reach Overhead;Locomotion Level    Examination-Participation Restrictions  Cleaning    Stability/Clinical Decision Making  Evolving/Moderate complexity    Clinical Decision Making  Moderate    Rehab Potential  Fair    PT Frequency  1x / week    PT Duration  12 weeks    PT Treatment/Interventions  Canalith Repostioning;Neuromuscular re-education;Balance training;Therapeutic exercise;Therapeutic activities;Stair training;Gait training;Patient/family education;Vestibular;ADLs/Self Care Home Management;Functional mobility training    PT Next Visit Plan  Dix-Hallpike testing and CRT if indicated    Consulted and Agree with Plan of Care  Patient;Family member/caregiver       Patient will benefit from skilled therapeutic intervention in order to improve the following deficits and impairments:  Difficulty walking, Decreased safety awareness, Dizziness, Decreased activity tolerance, Pain, Decreased balance, Decreased strength, Decreased mobility  Visit Diagnosis: BPPV (benign paroxysmal positional vertigo), left  Dizziness and giddiness  Unsteadiness on feet     Problem List Patient Active Problem List   Diagnosis Date Noted  . Aortic atherosclerosis ([place]) 02/12/2013  . Abdominal pain, chronic, right lower quadrant 02/12/2013  . Weakness of limb 02/12/2013   Lady Deutscher PT, DPT [phone] Lady Deutscher 01/22/2019, 1:04 PM  [facility] [address] [place], [place], [address] Phone: [phone]   Fax:  [phone]  Name: Cynthia Boyer  Raymond MRN: [id number] Date of Birth: 10-31-1929

## 2019-01-28 ENCOUNTER — Ambulatory Visit: Payer: Medicare Other | Admitting: Physical Therapy

## 2019-01-28 ENCOUNTER — Other Ambulatory Visit: Payer: Self-pay

## 2019-01-28 DIAGNOSIS — H8112 Benign paroxysmal vertigo, left ear: Secondary | ICD-10-CM

## 2019-01-28 DIAGNOSIS — R2681 Unsteadiness on feet: Secondary | ICD-10-CM

## 2019-01-28 DIAGNOSIS — R42 Dizziness and giddiness: Secondary | ICD-10-CM

## 2019-01-28 NOTE — Therapy (Signed)
[facility] [address] [place], [place], [address] Phone: [phone]   Fax:  [phone]  Physical Therapy Treatment  Patient Details  Name: Cynthia Raymond MRN: [id number] Date of Birth: December 14, 1929 Referring Provider (PT): Dr. Jennings Books   Encounter Date: 01/28/2019  PT End of Session - 01/28/19 1254    Visit Number  2    Number of Visits  13    Date for PT Re-Evaluation  04/15/19    PT Start Time  [id number]    PT Stop Time  1335    PT Time Calculation (min)  40 min    Activity Tolerance  Patient tolerated treatment well    Behavior During Therapy  [facility] for tasks assessed/performed       Past Medical History:  Diagnosis Date  . Cancer ([place])    breast  . Graves disease   . Hypertension   . Osteoporosis   . Thyroid disease     Past Surgical History:  Procedure Laterality Date  . BACK SURGERY     X3  . BREAST LUMPECTOMY    . CATARACT EXTRACTION W/PHACO Left 02/18/2018   Procedure: CATARACT EXTRACTION PHACO AND INTRAOCULAR LENS PLACEMENT ([place]) LEFT;  Surgeon: Leandrew Koyanagi, MD;  Location: [place];  Service: Ophthalmology;  Laterality: Left;  . CATARACT EXTRACTION W/PHACO Right 03/17/2018   Procedure: CATARACT EXTRACTION PHACO AND INTRAOCULAR LENS PLACEMENT ([place])  RIGHT;  Surgeon: Leandrew Koyanagi, MD;  Location: [place];  Service: Ophthalmology;  Laterality: Right;  . ELBOW SURGERY Right   . HAND SURGERY Right   . JOINT REPLACEMENT Bilateral    Both knees, left shoulder  . ROTATOR CUFF REPAIR Bilateral     There were no vitals filed for this visit.  Subjective Assessment - 01/28/19 1254    Subjective  Patient states that she has continued to have multiple daily episodes of vertigo. Patient states that she went to the doctor and was told she has two bone spurs on her left foot.    Pertinent History  Patient reports she began having dizziness early last year in 2020 that has  worsened. Patient's daughter reports that patient has had poor balance for many years due to patient's back and hip issues. Patient describes her dizziness as vertigo, light-headedness upon standing, imbalance and unsteadiness especially with ambulation. Patient reports nausea at times due to dizziness. Patient reports that she veers when she walks. Patient reports multiple daily episodes of vertigo that last seconds to minutes. Patient reports rolling over in bed, getting into/out of bed, standing, quick movements, head turns, reaching up, looking up all provoke her symptoms of dizziness. Patient reports sitting and lying still help decrease her symptoms. Patient reports she tried Meclizine but reported that it did not help with the dizziness and made her sleepy therefore she stopped taking the Meclizine. Patient has not been seen by ENT or cardiology per MR. Patient had CT head that was negative for acute abnormality per MR. Patient with headache in right temporal region and diagnosis of right occipital neuralgia per medical record.    Diagnostic tests  CT head-no acute abnormatility    Patient Stated Goals  to have decreased dizziness; to be better able to get into/out of bed and chair        Canalith Repositioning Manuever: Patient reports that she was not sore after last session when one Epley maneuver was performed. On mat table, performed left Dix-Hallpike testing and it was  positive with left upbeating torsional nystagmus of short duration without latency. Patient rated her vertigo as 4/10 at rest and 5/10 with Dix-Hallpike test. Performed left canalith repositioning maneuver (CRT). Repeated left CRT for a total of 3 maneuvers with 1-2 minute holds in each test position.  Patient did no report vertigo and no nystagmus was observed with second and third maneuver, but during the second maneuver patient lifted her head during change in position from left rotation to right rotation which might have  impacted the treatment therefore performed a third maneuver to try to promote full clearance of particles. Patient required assistance with positioning her legs in knee flexion to help alleviate back discomfort during maneuvers.  Patient reported 5/10 vertigo with first and no vertigo with second and third maneuvers.    PT Education - 01/28/19 1254    Education Details  discussed BPPV and Epley maneuver    Person(s) Educated  Patient    Methods  Explanation    Comprehension  Verbalized understanding       PT Short Term Goals - 01/22/19 0837      PT SHORT TERM GOAL #1   Title  Patient will report 25% or greater improvement in her symptoms of dizziness and imbalance with provoking motions or positions    Time  4    Period  Weeks    Status  New    Target Date  02/18/19        PT Long Term Goals - 01/22/19 0838      PT LONG TERM GOAL #1   Title  Patient will report 50% or greater improvement in her symptoms of dizziness and imbalance with provoking motions or positions    Time  12    Period  Weeks    Status  New    Target Date  04/15/19      PT LONG TERM GOAL #2   Title  Patient will reduce perceived disability to low levels as indicated by <40 on Dizziness Handicap Inventory.    Baseline  scored 68/100 on 01/22/2019    Time  12    Period  Weeks    Status  New    Target Date  04/15/19      PT LONG TERM GOAL #3   Title  Patient will reduce falls risk as indicated by improvement of 20% or more on Activities Specific Balance Confidence Scale (ABC) score to help reduce patient's falls risk.    Baseline  scored 30% on 01/22/2019    Time  12    Period  Weeks    Status  New    Target Date  04/15/19      PT LONG TERM GOAL #4   Title  Patient will report that she has moderate difficulty with getting into/out of bed and chair on the FOTO survey to indicate improve function with her ADLs.    Baseline  scored Limited a Lot on intake FOTO survey for these two items on 01/21/2019     Time  12    Period  Weeks    Status  New    Target Date  04/15/19            Plan - 01/29/19 0820    Clinical Impression Statement  Patient with positive left Dix-Hallpike test and patient was able to tolerate three Epley maneuvers with 1-2 minute holds in each position this date. Patient reported no vertigo and no nystagmus was observed with last 2 maneuvers. Patient  continues to be unsteady on her feet and is at high risk for falls. Patient would benefit from continued PT services to further address deficits and goals as set on plan of care.    Personal Factors and Comorbidities  Comorbidity 3+;Time since onset of injury/illness/exacerbation    Comorbidities  CAD, depression, A-fib, HTN, Right occipital neuralgia and Grave's Disease    Examination-Activity Limitations  Bed Mobility;Bend;Reach Overhead;Locomotion Level    Examination-Participation Restrictions  Cleaning    Stability/Clinical Decision Making  Evolving/Moderate complexity    Rehab Potential  Fair    PT Frequency  1x / week    PT Duration  12 weeks    PT Treatment/Interventions  Canalith Repostioning;Neuromuscular re-education;Balance training;Therapeutic exercise;Therapeutic activities;Stair training;Gait training;Patient/family education;Vestibular;ADLs/Self Care Home Management;Functional mobility training    PT Next Visit Plan  Dix-Hallpike testing and CRT if indicated    Consulted and Agree with Plan of Care  Patient;Family member/caregiver       Patient will benefit from skilled therapeutic intervention in order to improve the following deficits and impairments:  Difficulty walking, Decreased safety awareness, Dizziness, Decreased activity tolerance, Pain, Decreased balance, Decreased strength, Decreased mobility  Visit Diagnosis: BPPV (benign paroxysmal positional vertigo), left  Dizziness and giddiness  Unsteadiness on feet     Problem List Patient Active Problem List   Diagnosis Date Noted  . Aortic  atherosclerosis ([place]) 02/12/2013  . Abdominal pain, chronic, right lower quadrant 02/12/2013  . Weakness of limb 02/12/2013   Lady Deutscher PT, DPT [phone] Lady Deutscher 01/29/2019, 8:22 AM  [facility] [address] [place], [place], [address] Phone: [phone]   Fax:  [phone]  Name: Cynthia Raymond MRN: [id number] Date of Birth: 1929-05-23

## 2019-02-04 ENCOUNTER — Encounter: Payer: Self-pay | Admitting: Physical Therapy

## 2019-02-04 ENCOUNTER — Other Ambulatory Visit: Payer: Self-pay

## 2019-02-04 ENCOUNTER — Ambulatory Visit: Payer: Medicare Other | Admitting: Physical Therapy

## 2019-02-04 DIAGNOSIS — H8112 Benign paroxysmal vertigo, left ear: Secondary | ICD-10-CM | POA: Diagnosis not present

## 2019-02-04 DIAGNOSIS — R42 Dizziness and giddiness: Secondary | ICD-10-CM

## 2019-02-04 DIAGNOSIS — R2681 Unsteadiness on feet: Secondary | ICD-10-CM

## 2019-02-04 NOTE — Therapy (Signed)
[place] [facility] [address] [place], [place], [address] Phone: [phone]   Fax:  [phone]  Physical Therapy Treatment  Patient Details  Name: Cynthia Raymond MRN: [id number] Date of Birth: 08-23-1929 Referring Provider (PT): Dr. Jennings Books   Encounter Date: 02/04/2019  PT End of Session - 02/04/19 1255    Visit Number  3    Number of Visits  13    Date for PT Re-Evaluation  04/15/19    PT Start Time  1254    PT Stop Time  1328    PT Time Calculation (min)  34 min    Activity Tolerance  Patient tolerated treatment well    Behavior During Therapy  [facility] for tasks assessed/performed       Past Medical History:  Diagnosis Date  . Cancer ([place])    breast  . Graves disease   . Hypertension   . Osteoporosis   . Thyroid disease     Past Surgical History:  Procedure Laterality Date  . BACK SURGERY     X3  . BREAST LUMPECTOMY    . CATARACT EXTRACTION W/PHACO Left 02/18/2018   Procedure: CATARACT EXTRACTION PHACO AND INTRAOCULAR LENS PLACEMENT ([place]) LEFT;  Surgeon: Leandrew Koyanagi, MD;  Location: [place];  Service: Ophthalmology;  Laterality: Left;  . CATARACT EXTRACTION W/PHACO Right 03/17/2018   Procedure: CATARACT EXTRACTION PHACO AND INTRAOCULAR LENS PLACEMENT ([place])  RIGHT;  Surgeon: Leandrew Koyanagi, MD;  Location: [place];  Service: Ophthalmology;  Laterality: Right;  . ELBOW SURGERY Right   . HAND SURGERY Right   . JOINT REPLACEMENT Bilateral    Both knees, left shoulder  . ROTATOR CUFF REPAIR Bilateral     There were no vitals filed for this visit.  Subjective Assessment - 02/04/19 1254    Subjective  Patient states she is a little better. Patient reports she is having episodes of vertigo less frequently now.    Pertinent History  Patient reports she began having dizziness early last year in 2020 that has worsened. Patient's daughter reports that patient has had poor balance for  many years due to patient's back and hip issues. Patient describes her dizziness as vertigo, light-headedness upon standing, imbalance and unsteadiness especially with ambulation. Patient reports nausea at times due to dizziness. Patient reports that she veers when she walks. Patient reports multiple daily episodes of vertigo that last seconds to minutes. Patient reports rolling over in bed, getting into/out of bed, standing, quick movements, head turns, reaching up, looking up all provoke her symptoms of dizziness. Patient reports sitting and lying still help decrease her symptoms. Patient reports she tried Meclizine but reported that it did not help with the dizziness and made her sleepy therefore she stopped taking the Meclizine. Patient has not been seen by ENT or cardiology per MR. Patient had CT head that was negative for acute abnormality per MR. Patient with headache in right temporal region and diagnosis of right occipital neuralgia per medical record.    Diagnostic tests  CT head-no acute abnormatility    Patient Stated Goals  to have decreased dizziness; to be better able to get into/out of bed and chair       Patient reports she has 8/10 back pain on arrival.   Canalith Repositioning Manuever: On mat table, performed left Dix-Hallpike testing and it was positive with left upbeating torsional nystagmus of short duration without latency. Performed left canalith repositioning maneuver (CRT). Repeated left CRT for  a total of 3 maneuvers with retesting between maneuvers. Patient required two person assist with performing canalith repositioning maneuver and held each treatment position for 1-2 minutes each. Patient reported 3-4/10 vertigo with first 4/1 and 0/10 with last maneuver.    PT Education - 02/04/19 1255    Education Details  reviewed plan of care and Epley maneuver    Person(s) Educated  Patient    Methods  Explanation    Comprehension  Verbalized understanding       PT Short  Term Goals - 01/22/19 0837      PT SHORT TERM GOAL #1   Title  Patient will report 25% or greater improvement in her symptoms of dizziness and imbalance with provoking motions or positions    Time  4    Period  Weeks    Status  New    Target Date  02/18/19        PT Long Term Goals - 01/22/19 0838      PT LONG TERM GOAL #1   Title  Patient will report 50% or greater improvement in her symptoms of dizziness and imbalance with provoking motions or positions    Time  12    Period  Weeks    Status  New    Target Date  04/15/19      PT LONG TERM GOAL #2   Title  Patient will reduce perceived disability to low levels as indicated by <40 on Dizziness Handicap Inventory.    Baseline  scored 68/100 on 01/22/2019    Time  12    Period  Weeks    Status  New    Target Date  04/15/19      PT LONG TERM GOAL #3   Title  Patient will reduce falls risk as indicated by improvement of 20% or more on Activities Specific Balance Confidence Scale (ABC) score to help reduce patient's falls risk.    Baseline  scored 30% on 01/22/2019    Time  12    Period  Weeks    Status  New    Target Date  04/15/19      PT LONG TERM GOAL #4   Title  Patient will report that she has moderate difficulty with getting into/out of bed and chair on the FOTO survey to indicate improve function with her ADLs.    Baseline  scored Limited a Lot on intake FOTO survey for these two items on 01/21/2019    Time  12    Period  Weeks    Status  New    Target Date  04/15/19            Plan - 02/04/19 1642    Clinical Impression Statement  Patient reports that she has had fewer episodes of vertigo since last visit. Patient had positive Dix-Hallpike test again this date and performed three canalith repositioning maneuvers. Patient would benefit from continued PT services to try to address patient's subjective symptoms of dizziness and imbalance and to try to decrease her falls risk.    Personal Factors and Comorbidities   Comorbidity 3+;Time since onset of injury/illness/exacerbation    Comorbidities  CAD, depression, A-fib, HTN, Right occipital neuralgia and Grave's Disease    Examination-Activity Limitations  Bed Mobility;Bend;Reach Overhead;Locomotion Level    Examination-Participation Restrictions  Cleaning    Stability/Clinical Decision Making  Evolving/Moderate complexity    Rehab Potential  Fair    PT Frequency  1x / week    PT Duration  12 weeks    PT Treatment/Interventions  Canalith Repostioning;Neuromuscular re-education;Balance training;Therapeutic exercise;Therapeutic activities;Stair training;Gait training;Patient/family education;Vestibular;ADLs/Self Care Home Management;Functional mobility training    PT Next Visit Plan  Dix-Hallpike testing and CRT if indicated    Consulted and Agree with Plan of Care  Patient;Family member/caregiver       Patient will benefit from skilled therapeutic intervention in order to improve the following deficits and impairments:  Difficulty walking, Decreased safety awareness, Dizziness, Decreased activity tolerance, Pain, Decreased balance, Decreased strength, Decreased mobility  Visit Diagnosis: BPPV (benign paroxysmal positional vertigo), left  Unsteadiness on feet  Dizziness and giddiness     Problem List Patient Active Problem List   Diagnosis Date Noted  . Aortic atherosclerosis ([place]) 02/12/2013  . Abdominal pain, chronic, right lower quadrant 02/12/2013  . Weakness of limb 02/12/2013   Lady Deutscher PT, DPT [phone] Lady Deutscher 02/04/2019, 4:48 PM  [place] [facility] [address] [place], [place], [address] Phone: [phone]   Fax:  [phone]  Name: Cicilia Maseda MRN: [id number] Date of Birth: 1929/03/24

## 2019-02-11 ENCOUNTER — Encounter: Payer: Self-pay | Admitting: Physical Therapy

## 2019-02-11 ENCOUNTER — Ambulatory Visit: Payer: Medicare Other | Attending: Neurology | Admitting: Physical Therapy

## 2019-02-11 ENCOUNTER — Other Ambulatory Visit: Payer: Self-pay

## 2019-02-11 DIAGNOSIS — H8112 Benign paroxysmal vertigo, left ear: Secondary | ICD-10-CM

## 2019-02-11 DIAGNOSIS — R2681 Unsteadiness on feet: Secondary | ICD-10-CM | POA: Insufficient documentation

## 2019-02-11 DIAGNOSIS — R42 Dizziness and giddiness: Secondary | ICD-10-CM

## 2019-02-11 NOTE — Therapy (Signed)
[place] [facility] [address] [place], [place], [address] Phone: [phone]   Fax:  [phone]  Physical Therapy Treatment  Patient Details  Name: Cynthia Raymond MRN: [id number] Date of Birth: 11/03/1929 Referring Provider (PT): Dr. Jennings Books   Encounter Date: 02/11/2019  PT End of Session - 02/11/19 1436    Visit Number  4    Number of Visits  13    Date for PT Re-Evaluation  04/15/19    PT Start Time  1251    PT Stop Time  1325    PT Time Calculation (min)  34 min    Activity Tolerance  Patient tolerated treatment well    Behavior During Therapy  [facility] for tasks assessed/performed       Past Medical History:  Diagnosis Date  . Cancer ([place])    breast  . Graves disease   . Hypertension   . Osteoporosis   . Thyroid disease     Past Surgical History:  Procedure Laterality Date  . BACK SURGERY     X3  . BREAST LUMPECTOMY    . CATARACT EXTRACTION W/PHACO Left 02/18/2018   Procedure: CATARACT EXTRACTION PHACO AND INTRAOCULAR LENS PLACEMENT ([place]) LEFT;  Surgeon: Leandrew Koyanagi, MD;  Location: [place];  Service: Ophthalmology;  Laterality: Left;  . CATARACT EXTRACTION W/PHACO Right 03/17/2018   Procedure: CATARACT EXTRACTION PHACO AND INTRAOCULAR LENS PLACEMENT ([place])  RIGHT;  Surgeon: Leandrew Koyanagi, MD;  Location: [place];  Service: Ophthalmology;  Laterality: Right;  . ELBOW SURGERY Right   . HAND SURGERY Right   . JOINT REPLACEMENT Bilateral    Both knees, left shoulder  . ROTATOR CUFF REPAIR Bilateral     There were no vitals filed for this visit.  Subjective Assessment - 02/11/19 1435    Subjective  Patient reports that she had only one mild episode of dizziness this last week.    Pertinent History  Patient reports she began having dizziness early last year in 2020 that has worsened. Patient's daughter reports that patient has had poor balance for many years due to patient's back  and hip issues. Patient describes her dizziness as vertigo, light-headedness upon standing, imbalance and unsteadiness especially with ambulation. Patient reports nausea at times due to dizziness. Patient reports that she veers when she walks. Patient reports multiple daily episodes of vertigo that last seconds to minutes. Patient reports rolling over in bed, getting into/out of bed, standing, quick movements, head turns, reaching up, looking up all provoke her symptoms of dizziness. Patient reports sitting and lying still help decrease her symptoms. Patient reports she tried Meclizine but reported that it did not help with the dizziness and made her sleepy therefore she stopped taking the Meclizine. Patient has not been seen by ENT or cardiology per MR. Patient had CT head that was negative for acute abnormality per MR. Patient with headache in right temporal region and diagnosis of right occipital neuralgia per medical record.    Diagnostic tests  CT head-no acute abnormatility    Patient Stated Goals  to have decreased dizziness; to be better able to get into/out of bed and chair       Canalith Repositioning Maneuver:  On mat table performed left Dix-Hallpike testing and it was positive with left upbeating torsional nystagmus of short duration.  Patient required assistance of two to help with positioning.  Repeated left CRT for a total of 3 maneuvers with retesting between maneuvers. Patient reported  vertigo with first maneuver and noted nystagmus, but patient denied vertigo and did not observe nystagmus during 2nd and 3rd maneuvers.    PT Education - 02/11/19 1436    Education Details  discussed plan of care    Person(s) Educated  Patient;Child(ren)    Methods  Explanation    Comprehension  Verbalized understanding       PT Short Term Goals - 01/22/19 0837      PT SHORT TERM GOAL #1   Title  Patient will report 25% or greater improvement in her symptoms of dizziness and imbalance with  provoking motions or positions    Time  4    Period  Weeks    Status  New    Target Date  02/18/19        PT Long Term Goals - 01/22/19 0838      PT LONG TERM GOAL #1   Title  Patient will report 50% or greater improvement in her symptoms of dizziness and imbalance with provoking motions or positions    Time  12    Period  Weeks    Status  New    Target Date  04/15/19      PT LONG TERM GOAL #2   Title  Patient will reduce perceived disability to low levels as indicated by <40 on Dizziness Handicap Inventory.    Baseline  scored 68/100 on 01/22/2019    Time  12    Period  Weeks    Status  New    Target Date  04/15/19      PT LONG TERM GOAL #3   Title  Patient will reduce falls risk as indicated by improvement of 20% or more on Activities Specific Balance Confidence Scale (ABC) score to help reduce patient's falls risk.    Baseline  scored 30% on 01/22/2019    Time  12    Period  Weeks    Status  New    Target Date  04/15/19      PT LONG TERM GOAL #4   Title  Patient will report that she has moderate difficulty with getting into/out of bed and chair on the FOTO survey to indicate improve function with her ADLs.    Baseline  scored Limited a Lot on intake FOTO survey for these two items on 01/21/2019    Time  12    Period  Weeks    Status  New    Target Date  04/15/19            Plan - 02/11/19 1437    Clinical Impression Statement  Patient reports that she only had one mild episode of dizziness this past week and that it was not as intense as prior episodes. Patient with positive left Dix-Hallpike test and performed three Epley maneuvers. Will consider trying to add vibration during Epley maneuver next session if Dix-hallpike test continues to be positive. Patient would benefit from continued PT services until full clearance of BPPV particles is obtained.    Personal Factors and Comorbidities  Comorbidity 3+;Time since onset of injury/illness/exacerbation     Comorbidities  CAD, depression, A-fib, HTN, Right occipital neuralgia and Grave's Disease    Examination-Activity Limitations  Bed Mobility;Bend;Reach Overhead;Locomotion Level    Examination-Participation Restrictions  Cleaning    Stability/Clinical Decision Making  Evolving/Moderate complexity    Rehab Potential  Fair    PT Frequency  1x / week    PT Duration  12 weeks    PT  Treatment/Interventions  Canalith Repostioning;Neuromuscular re-education;Balance training;Therapeutic exercise;Therapeutic activities;Stair training;Gait training;Patient/family education;Vestibular;ADLs/Self Care Home Management;Functional mobility training    PT Next Visit Plan  Dix-Hallpike testing and CRT if indicated    Consulted and Agree with Plan of Care  Patient;Family member/caregiver       Patient will benefit from skilled therapeutic intervention in order to improve the following deficits and impairments:  Difficulty walking, Decreased safety awareness, Dizziness, Decreased activity tolerance, Pain, Decreased balance, Decreased strength, Decreased mobility  Visit Diagnosis: BPPV (benign paroxysmal positional vertigo), left  Unsteadiness on feet  Dizziness and giddiness     Problem List Patient Active Problem List   Diagnosis Date Noted  . Aortic atherosclerosis ([place]) 02/12/2013  . Abdominal pain, chronic, right lower quadrant 02/12/2013  . Weakness of limb 02/12/2013   Lady Deutscher PT, DPT [phone]  Lady Deutscher 02/11/2019, 5:23 PM  [place] [facility] [address] [place], [place], [address] Phone: [phone]   Fax:  [phone]  Name: Cynthia Raymond MRN: [id number] Date of Birth: 1929/07/20

## 2019-02-19 ENCOUNTER — Other Ambulatory Visit: Payer: Self-pay

## 2019-02-19 ENCOUNTER — Ambulatory Visit: Payer: Medicare Other | Admitting: Physical Therapy

## 2019-02-19 DIAGNOSIS — R2681 Unsteadiness on feet: Secondary | ICD-10-CM

## 2019-02-19 DIAGNOSIS — R42 Dizziness and giddiness: Secondary | ICD-10-CM

## 2019-02-19 DIAGNOSIS — H8112 Benign paroxysmal vertigo, left ear: Secondary | ICD-10-CM | POA: Diagnosis not present

## 2019-02-19 NOTE — Therapy (Signed)
[facility] [address]. [place], [place], [address] Phone: [phone]   Fax:  [phone]  Physical Therapy Treatment  Patient Details  Name: Cynthia Raymond MRN: [id number] Date of Birth: [birth year] Referring Provider (PT): Dr. Jennings Books   Encounter Date: 02/19/2019  PT End of Session - 02/19/19 1108    Visit Number  5    Number of Visits  13    Date for PT Re-Evaluation  04/15/19    PT Start Time  1004    PT Stop Time  1053    PT Time Calculation (min)  49 min    Activity Tolerance  Patient tolerated treatment well    Behavior During Therapy  [facility] for tasks assessed/performed       Past Medical History:  Diagnosis Date  . Cancer ([place])    breast  . Graves disease   . Hypertension   . Osteoporosis   . Thyroid disease     Past Surgical History:  Procedure Laterality Date  . BACK SURGERY     X3  . BREAST LUMPECTOMY    . CATARACT EXTRACTION W/PHACO Left 02/18/2018   Procedure: CATARACT EXTRACTION PHACO AND INTRAOCULAR LENS PLACEMENT ([place]) LEFT;  Surgeon: Leandrew Koyanagi, MD;  Location: [place];  Service: Ophthalmology;  Laterality: Left;  . CATARACT EXTRACTION W/PHACO Right 03/17/2018   Procedure: CATARACT EXTRACTION PHACO AND INTRAOCULAR LENS PLACEMENT ([place])  RIGHT;  Surgeon: Leandrew Koyanagi, MD;  Location: [place];  Service: Ophthalmology;  Laterality: Right;  . ELBOW SURGERY Right   . HAND SURGERY Right   . JOINT REPLACEMENT Bilateral    Both knees, left shoulder  . ROTATOR CUFF REPAIR Bilateral     There were no vitals filed for this visit.  Subjective Assessment - 02/19/19 1005    Subjective  Patient states she has not had a single episode of dizziness this past week. Patient states she got the first Covid vaccination last week.    Pertinent History  Patient reports she began having dizziness early last year in 2020 that has worsened. Patient's daughter reports that  patient has had poor balance for many years due to patient's back and hip issues. Patient describes her dizziness as vertigo, light-headedness upon standing, imbalance and unsteadiness especially with ambulation. Patient reports nausea at times due to dizziness. Patient reports that she veers when she walks. Patient reports multiple daily episodes of vertigo that last seconds to minutes. Patient reports rolling over in bed, getting into/out of bed, standing, quick movements, head turns, reaching up, looking up all provoke her symptoms of dizziness. Patient reports sitting and lying still help decrease her symptoms. Patient reports she tried Meclizine but reported that it did not help with the dizziness and made her sleepy therefore she stopped taking the Meclizine. Patient has not been seen by ENT or cardiology per MR. Patient had CT head that was negative for acute abnormality per MR. Patient with headache in right temporal region and diagnosis of right occipital neuralgia per medical record.    Diagnostic tests  CT head-no acute abnormatility    Patient Stated Goals  to have decreased dizziness; to be better able to get into/out of bed and chair       Canalith Repositioning Manuever: On mat table, performed left Dix-Hallpike testing and it was negative with no nystagmus observed and patient denied vertigo. Performed two left canalith repositioning maneuvers (CRT) to ensure full clearance of particles.  Patient reported 0/10  vertigo. Patient asked about nystagmus and showed patient and her daughter YouTube video of BPPV with nystagmus observed.   Note: After canalith repositioning maneuver, performed neuromuscular reeducation.  Neuromuscular Re-education: FUNCTIONAL OUTCOME MEASURES:  Results Comments  DHI 0/100 low perception of handicap; normal  ABC Scale 51.9% Falls risk; in need of intervention  FOTO 61/100    Discussed progress towards goals and plan of care. Discussed with patient that she  would continue to benefit from PT services to address further functional deficits especially balance and strengthening deficits. Patient in agreement with continuing PT to address deficits.     PT Education - 02/19/19 1633    Education Details  discussed progress towards goals and plan of care    Person(s) Educated  Patient;Child(ren)    Methods  Explanation    Comprehension  Verbalized understanding       PT Short Term Goals - 02/19/19 1027      PT SHORT TERM GOAL #1   Title  Patient will report 25% or greater improvement in her symptoms of dizziness and imbalance with provoking motions or positions    Baseline  patient reports 100% improvement in her symptoms. States she is not having any dizziness on 02/19/2019    Time  4    Period  Weeks    Status  Achieved    Target Date  02/18/19        PT Long Term Goals - 02/19/19 1110      PT LONG TERM GOAL #1   Title  Patient will report 50% or greater improvement in her symptoms of dizziness and imbalance with provoking motions or positions    Baseline  On 02/19/2019, patient reports 100% improvement in her symptoms of dizziness and imbalance.    Time  12    Period  Weeks    Status  Achieved      PT LONG TERM GOAL #2   Title  Patient will reduce perceived disability to low levels as indicated by <40 on Dizziness Handicap Inventory.    Baseline  scored 68/100 on 01/22/2019; scored 0/100 on 02/19/2019    Time  12    Period  Weeks    Status  Achieved      PT LONG TERM GOAL #3   Title  Patient will reduce falls risk as indicated by improvement of 20% or more on Activities Specific Balance Confidence Scale (ABC) score to help reduce patient's falls risk.    Baseline  scored 30% on 01/22/2019; scored 51.9% on 02/19/2019    Time  12    Period  Weeks    Status  Achieved      PT LONG TERM GOAL #4   Title  Patient will report that she has moderate difficulty with getting into/out of bed and chair on the FOTO survey to indicate improve  function with her ADLs.    Baseline  scored Limited a Lot on intake FOTO survey for these two items on 01/21/2019; patient reports she does not have difficulty getting into/out of bed now. FOTO did not ask her these same questions on the survey, but patient's intake FOTO status score was 30/100 and this date it was 61/100 indicating significant improvement in overal status.    Time  12    Period  Weeks    Status  Achieved      PT LONG TERM GOAL #5   Title  Patient will reduce falls risk  as evidenced by a score of 67%  or more on the Activities Specific Balance Confidence Scale (ABC) score to help reduce patient's falls risk.    Time  12    Period  Weeks    Status  New    Target Date  04/15/19      Additional Long Term Goals   Additional Long Term Goals  Yes      PT LONG TERM GOAL #6   Title  Patient will be able to ambulate with LRAD 200' or more without evidence of imbalance with Supervision in order to be able to ambulate about her home safely.    Time  12    Period  Weeks    Status  New    Target Date  04/15/19            Plan - 02/19/19 1637    Clinical Impression Statement  Patient with negative Dix-Hallpike test this date and patient reports that she has not had any further episodes of dizziness in the past week. Patient reports that she is feeling more confident with walking about her home, but would like to do further physical therapy to try to further reduce her falls risk. Patient would benefit from PT services to concentrate now on pirmarily the strength, gait and balance deficits. Additional goals added to plan of care.    Personal Factors and Comorbidities  Comorbidity 3+;Time since onset of injury/illness/exacerbation    Comorbidities  CAD, depression, A-fib, HTN, Right occipital neuralgia and Grave's Disease    Examination-Activity Limitations  Bed Mobility;Bend;Reach Overhead;Locomotion Level    Examination-Participation Restrictions  Cleaning    Stability/Clinical  Decision Making  Evolving/Moderate complexity    Rehab Potential  Fair    PT Frequency  1x / week    PT Duration  12 weeks    PT Treatment/Interventions  Canalith Repostioning;Neuromuscular re-education;Balance training;Therapeutic exercise;Therapeutic activities;Stair training;Gait training;Patient/family education;Vestibular;ADLs/Self Care Home Management;Functional mobility training    PT Next Visit Plan  Dix-Hallpike testing and CRT if indicated    Consulted and Agree with Plan of Care  Patient;Family member/caregiver       Patient will benefit from skilled therapeutic intervention in order to improve the following deficits and impairments:  Difficulty walking, Decreased safety awareness, Dizziness, Decreased activity tolerance, Pain, Decreased balance, Decreased strength, Decreased mobility  Visit Diagnosis: BPPV (benign paroxysmal positional vertigo), left  Unsteadiness on feet  Dizziness and giddiness     Problem List Patient Active Problem List   Diagnosis Date Noted  . Aortic atherosclerosis ([place]) 02/12/2013  . Abdominal pain, chronic, right lower quadrant 02/12/2013  . Weakness of limb 02/12/2013   Lady Deutscher PT, DPT [phone] Lady Deutscher 02/19/2019, 5:05 PM  [facility] [address] [place], [place], [address] Phone: [phone]   Fax:  [phone]  Name: Samanda Delker MRN: [id number] Date of Birth: 15-Dec-1929

## 2019-02-24 ENCOUNTER — Ambulatory Visit: Payer: Medicare Other

## 2019-02-26 ENCOUNTER — Ambulatory Visit: Payer: Medicare Other

## 2019-03-05 ENCOUNTER — Encounter: Payer: Self-pay | Admitting: Physical Therapy

## 2019-03-05 ENCOUNTER — Ambulatory Visit: Payer: 59

## 2019-03-05 ENCOUNTER — Ambulatory Visit: Payer: Medicare Other | Admitting: Physical Therapy

## 2019-03-05 ENCOUNTER — Other Ambulatory Visit: Payer: Self-pay

## 2019-03-05 DIAGNOSIS — R42 Dizziness and giddiness: Secondary | ICD-10-CM

## 2019-03-05 DIAGNOSIS — H8112 Benign paroxysmal vertigo, left ear: Secondary | ICD-10-CM | POA: Diagnosis not present

## 2019-03-05 DIAGNOSIS — R2681 Unsteadiness on feet: Secondary | ICD-10-CM

## 2019-03-08 NOTE — Therapy (Signed)
[facility] [address] [place], [place], [address] Phone: [phone]   Fax:  [phone]  Physical Therapy Treatment  Patient Details  Name: Cynthia Raymond MRN: [id number] Date of Birth: 1929-07-08 Referring Provider (PT): Dr. Jennings Books   Encounter Date: 03/05/2019  PT End of Session - 03/08/19 1614    Visit Number  6    Number of Visits  13    Date for PT Re-Evaluation  04/15/19    PT Start Time  [id number]    PT Stop Time  1102    PT Time Calculation (min)  39 min    Equipment Utilized During Treatment  Gait belt    Activity Tolerance  Patient tolerated treatment well    Behavior During Therapy  [facility] for tasks assessed/performed       Past Medical History:  Diagnosis Date  . Cancer ([place])    breast  . Graves disease   . Hypertension   . Osteoporosis   . Thyroid disease     Past Surgical History:  Procedure Laterality Date  . BACK SURGERY     X3  . BREAST LUMPECTOMY    . CATARACT EXTRACTION W/PHACO Left 02/18/2018   Procedure: CATARACT EXTRACTION PHACO AND INTRAOCULAR LENS PLACEMENT ([place]) LEFT;  Surgeon: Leandrew Koyanagi, MD;  Location: [place];  Service: Ophthalmology;  Laterality: Left;  . CATARACT EXTRACTION W/PHACO Right 03/17/2018   Procedure: CATARACT EXTRACTION PHACO AND INTRAOCULAR LENS PLACEMENT ([place])  RIGHT;  Surgeon: Leandrew Koyanagi, MD;  Location: [place];  Service: Ophthalmology;  Laterality: Right;  . ELBOW SURGERY Right   . HAND SURGERY Right   . JOINT REPLACEMENT Bilateral    Both knees, left shoulder  . ROTATOR CUFF REPAIR Bilateral     There were no vitals filed for this visit.  Subjective Assessment - 03/08/19 1612    Subjective  Patient states that she has not had any further episodes of vertigo. Patient reports that her left foot is still hurting due to bone spurs.    Pertinent History  Patient reports she began having dizziness early last year in 2020 that  has worsened. Patient's daughter reports that patient has had poor balance for many years due to patient's back and hip issues. Patient describes her dizziness as vertigo, light-headedness upon standing, imbalance and unsteadiness especially with ambulation. Patient reports nausea at times due to dizziness. Patient reports that she veers when she walks. Patient reports multiple daily episodes of vertigo that last seconds to minutes. Patient reports rolling over in bed, getting into/out of bed, standing, quick movements, head turns, reaching up, looking up all provoke her symptoms of dizziness. Patient reports sitting and lying still help decrease her symptoms. Patient reports she tried Meclizine but reported that it did not help with the dizziness and made her sleepy therefore she stopped taking the Meclizine. Patient has not been seen by ENT or cardiology per MR. Patient had CT head that was negative for acute abnormality per MR. Patient with headache in right temporal region and diagnosis of right occipital neuralgia per medical record.    Diagnostic tests  CT head-no acute abnormatility    Patient Stated Goals  to have decreased dizziness; to be better able to get into/out of bed and chair         [facility] PT Assessment - 03/08/19 1610      Standardized Balance Assessment   Standardized Balance Assessment  Berg Balance Test  Berg Balance Test   Sit to Stand  Able to stand without using hands and stabilize independently    Standing Unsupported  Able to stand 2 minutes with supervision    Sitting with Back Unsupported but Feet Supported on Floor or Stool  Able to sit safely and securely 2 minutes    Stand to Sit  Sits safely with minimal use of hands    Transfers  Able to transfer safely, minor use of hands    Standing Unsupported with Eyes Closed  Able to stand 10 seconds with supervision    Standing Unsupported with Feet Together  Able to place feet together independently and stand for 1  minute with supervision    From Standing, Reach Forward with Outstretched Arm  Can reach forward >5 cm safely (2")    From Standing Position, Pick up Object from Many to pick up shoe, needs supervision    From Standing Position, Turn to Look Behind Over each Shoulder  Looks behind from both sides and weight shifts well    Turn 360 Degrees  Able to turn 360 degrees safely but slowly    Standing Unsupported, Alternately Place Feet on Step/Stool  Able to complete 4 steps without aid or supervision    Standing Unsupported, One Foot in Front  Able to plae foot ahead of the other independently and hold 30 seconds    Standing on One Leg  Tries to lift leg/unable to hold 3 seconds but remains standing independently    Total Score  42      Patient arrives to clinic ambulating without AD with antalgic left gait pattern noted.  Therapeutic Exercise: Nustep: Patient performed Nu-step machine on level 4 for 5 minutes with cuing for pace for warm-up; no charge.  Sit to Stands:  Patient educated as to proper form with sit to stand. Patient performed 10 reps sit to stand from armless chair with verbal cuing for sequencing and to control eccentric descent with CGA.  Patient struggled towards the last few reps secondary to leg fatigue to complete ten repetitions.  Added to HEP.  Mini-squats: Patient performed 2 sets of 15 reps mini-squats with 5 second holds with verbal cues to maintain upright posture and to maintain holds.  Neuromuscular Re-education:  Single leg stance: Patient performed on firm surface single leg stance on right leg multiple 10-30 second reps with CGA.  Deferred single leg stance on left leg secondary to patient has multiple bone spurs per patient report and has foot pain.  Patient performed BERG balance test and scored 42/56 indicating falls risk.   Airex pad:  On Airex pad, patient performed NBOS and semi-tandem progressions with alternating lead leg static holds 30-60  seconds multiple reps.    PT Education - 03/08/19 1613    Education Details  issued sit to stand and mini-squats for HEP;    Person(s) Educated  Patient    Methods  Explanation;Demonstration;Verbal cues;Handout    Comprehension  Verbalized understanding;Returned demonstration       PT Short Term Goals - 02/19/19 1027      PT SHORT TERM GOAL #1   Title  Patient will report 25% or greater improvement in her symptoms of dizziness and imbalance with provoking motions or positions    Baseline  patient reports 100% improvement in her symptoms. States she is not having any dizziness on 02/19/2019    Time  4    Period  Weeks    Status  Achieved  Target Date  02/18/19        PT Long Term Goals - 03/08/19 1631      PT LONG TERM GOAL #1   Title  Patient will report 50% or greater improvement in her symptoms of dizziness and imbalance with provoking motions or positions    Baseline  On 02/19/2019, patient reports 100% improvement in her symptoms of dizziness and imbalance.    Time  12    Period  Weeks    Status  Achieved    Target Date  04/15/19      PT LONG TERM GOAL #2   Title  Patient will reduce perceived disability to low levels as indicated by <40 on Dizziness Handicap Inventory.    Baseline  scored 68/100 on 01/22/2019; scored 0/100 on 02/19/2019    Time  12    Period  Weeks    Status  Achieved    Target Date  04/15/19      PT LONG TERM GOAL #3   Title  Patient will reduce falls risk as indicated by improvement of 20% or more on Activities Specific Balance Confidence Scale (ABC) score to help reduce patient's falls risk.    Baseline  scored 30% on 01/22/2019; scored 51.9% on 02/19/2019    Time  12    Period  Weeks    Status  Achieved    Target Date  04/15/19      PT LONG TERM GOAL #4   Title  Patient will report that she has moderate difficulty with getting into/out of bed and chair on the FOTO survey to indicate improve function with her ADLs.    Baseline  scored Limited a  Lot on intake FOTO survey for these two items on 01/21/2019; patient reports she does not have difficulty getting into/out of bed now. FOTO did not ask her these same questions on the survey, but patient's intake FOTO status score was 30/100 and this date it was 61/100 indicating significant improvement in overal status.    Time  12    Period  Weeks    Status  Achieved    Target Date  04/15/19      PT LONG TERM GOAL #5   Title  Patient will reduce falls risk  as evidenced by a score of 67% or more on the Activities Specific Balance Confidence Scale (ABC) score to help reduce patient's falls risk.    Time  12    Period  Weeks    Status  New    Target Date  04/15/19      Additional Long Term Goals   Additional Long Term Goals  Yes      PT LONG TERM GOAL #6   Title  Patient will be able to ambulate with LRAD 200' or more without evidence of imbalance with Supervision in order to be able to ambulate about her home safely.    Time  12    Period  Weeks    Status  New    Target Date  04/15/19      PT LONG TERM GOAL #7   Title  Patient will reduce falls risk as indicated by Merrilee Jansky Balance Scale Score of > 50/56.    Baseline  Scored 42/56 on 03/05/2019    Time  12    Period  Weeks    Status  New    Target Date  04/15/19            Plan - 03/08/19 1613  Clinical Impression Statement  Patient returns to clinic to address strengthening and balance deficits. Patient reports that she has continued to not have episodes of vertigo. Patient scored 42/56 on the Berg balance tests this date indicating falls risk. Patient able to tolerate standing exercises including mini-squats and on Airex pad feet together and semi-tandem static holds. Patient provided home exercise program to help address balance and strength deficits this date. Patient reports that she has multiple painful bone spurs on left foot and therefore was limited with some activities such as deferred single leg stance on right foot  this date. Patient would benefit from continued PT services to further address functional deficits and goals.    Personal Factors and Comorbidities  Comorbidity 3+;Time since onset of injury/illness/exacerbation    Comorbidities  CAD, depression, A-fib, HTN, Right occipital neuralgia and Grave's Disease    Examination-Activity Limitations  Bed Mobility;Bend;Reach Overhead;Locomotion Level    Examination-Participation Restrictions  Cleaning    Stability/Clinical Decision Making  Evolving/Moderate complexity    Rehab Potential  Fair    PT Frequency  1x / week    PT Duration  12 weeks    PT Treatment/Interventions  Canalith Repostioning;Neuromuscular re-education;Balance training;Therapeutic exercise;Therapeutic activities;Stair training;Gait training;Patient/family education;Vestibular;ADLs/Self Care Home Management;Functional mobility training    PT Next Visit Plan  Dix-Hallpike testing and CRT if indicated    Consulted and Agree with Plan of Care  Patient;Family member/caregiver       Patient will benefit from skilled therapeutic intervention in order to improve the following deficits and impairments:  Difficulty walking, Decreased safety awareness, Dizziness, Decreased activity tolerance, Pain, Decreased balance, Decreased strength, Decreased mobility  Visit Diagnosis: Unsteadiness on feet  Dizziness and giddiness     Problem List Patient Active Problem List   Diagnosis Date Noted  . Aortic atherosclerosis ([place]) 02/12/2013  . Abdominal pain, chronic, right lower quadrant 02/12/2013  . Weakness of limb 02/12/2013   Lady Deutscher PT, DPT [phone] Lady Deutscher 03/08/2019, 4:15 PM  [facility] [address] [place], [place], [address] Phone: [phone]   Fax:  [phone]  Name: Cynthia Raymond MRN: [id number] Date of Birth: May 13, 1929

## 2019-03-10 ENCOUNTER — Ambulatory Visit: Payer: Medicare Other | Attending: Neurology

## 2019-03-26 ENCOUNTER — Other Ambulatory Visit (HOSPITAL_COMMUNITY): Payer: Self-pay | Admitting: Family Medicine

## 2019-03-26 ENCOUNTER — Ambulatory Visit
Admission: RE | Admit: 2019-03-26 | Discharge: 2019-03-26 | Disposition: A | Discharge status: Home / Self Care | Payer: Medicare Other | Source: Ambulatory Visit | Attending: Family Medicine | Admitting: Family Medicine

## 2019-03-26 ENCOUNTER — Other Ambulatory Visit: Payer: Self-pay

## 2019-03-26 ENCOUNTER — Other Ambulatory Visit: Payer: Self-pay | Admitting: Family Medicine

## 2019-03-26 DIAGNOSIS — M79604 Pain in right leg: Secondary | ICD-10-CM | POA: Diagnosis not present

## 2019-04-14 ENCOUNTER — Ambulatory Visit: Payer: Medicare Other | Attending: Neurology | Admitting: Physical Therapy

## 2019-04-14 ENCOUNTER — Other Ambulatory Visit: Payer: Self-pay

## 2019-04-14 ENCOUNTER — Encounter: Payer: Self-pay | Admitting: Physical Therapy

## 2019-04-14 DIAGNOSIS — R2681 Unsteadiness on feet: Secondary | ICD-10-CM

## 2019-04-14 DIAGNOSIS — H8112 Benign paroxysmal vertigo, left ear: Secondary | ICD-10-CM | POA: Diagnosis present

## 2019-04-14 DIAGNOSIS — R42 Dizziness and giddiness: Secondary | ICD-10-CM | POA: Diagnosis present

## 2019-04-14 NOTE — Therapy (Signed)
[place] [facility] [address] [place], [place], [address] Phone: [phone]   Fax:  [phone]  Physical Therapy Treatment/Re-evaluation  Patient Details  Name: Cynthia Raymond MRN: [id number] Date of Birth: 06/13/1929 Referring Provider (PT): Dr. Jennings Books   Encounter Date: 04/14/2019  PT End of Session - 04/14/19 1515    Visit Number  7    Number of Visits  16    Date for PT Re-Evaluation  06/09/19    PT Start Time  [id number]    PT Stop Time  1511    PT Time Calculation (min)  32 min    Equipment Utilized During Treatment  Gait belt    Activity Tolerance  Patient tolerated treatment well    Behavior During Therapy  [facility] for tasks assessed/performed       Past Medical History:  Diagnosis Date  . Cancer ([place])    breast  . Graves disease   . Hypertension   . Osteoporosis   . Thyroid disease     Past Surgical History:  Procedure Laterality Date  . BACK SURGERY     X3  . BREAST LUMPECTOMY    . CATARACT EXTRACTION W/PHACO Left 02/18/2018   Procedure: CATARACT EXTRACTION PHACO AND INTRAOCULAR LENS PLACEMENT ([place]) LEFT;  Surgeon: Leandrew Koyanagi, MD;  Location: [place];  Service: Ophthalmology;  Laterality: Left;  . CATARACT EXTRACTION W/PHACO Right 03/17/2018   Procedure: CATARACT EXTRACTION PHACO AND INTRAOCULAR LENS PLACEMENT ([place])  RIGHT;  Surgeon: Leandrew Koyanagi, MD;  Location: [place];  Service: Ophthalmology;  Laterality: Right;  . ELBOW SURGERY Right   . HAND SURGERY Right   . JOINT REPLACEMENT Bilateral    Both knees, left shoulder  . ROTATOR CUFF REPAIR Bilateral     There were no vitals filed for this visit.  Subjective Assessment - 04/15/19 1156    Subjective  Patient returns to clinic reporting that she has begun to have episodes of vertigo again. Patient denies falls since last seen in clinic on 03/05/19, but during session she reports that she bumped her head about 1-2 weeks  ago. She said she was bending over and when she came back up right she hit the top of her head on an open counter door edge which broke the skin.    Pertinent History  Patient reports she began having dizziness early last year in 2020 that has worsened. Patient's daughter reports that patient has had poor balance for many years due to patient's back and hip issues. Patient describes her dizziness as vertigo, light-headedness upon standing, imbalance and unsteadiness especially with ambulation. Patient reports nausea at times due to dizziness. Patient reports that she veers when she walks. Patient reports multiple daily episodes of vertigo that last seconds to minutes. Patient reports rolling over in bed, getting into/out of bed, standing, quick movements, head turns, reaching up, looking up all provoke her symptoms of dizziness. Patient reports sitting and lying still help decrease her symptoms. Patient reports she tried Meclizine but reported that it did not help with the dizziness and made her sleepy therefore she stopped taking the Meclizine. Patient has not been seen by ENT or cardiology per MR. Patient had CT head that was negative for acute abnormality per MR. Patient with headache in right temporal region and diagnosis of right occipital neuralgia per medical record.    Diagnostic tests  CT head-no acute abnormatility    Patient Stated Goals  to have decreased dizziness; to  be better able to get into/out of bed and chair         VESTIBULAR AND BALANCE RE-EVALUATION   HISTORY:  Subjective history of current problem: Patient returns to clinic reporting that she has begun to have episodes of vertigo again. Patient denies falls since last seen in clinic on 03/05/19, but during session she reports that she bumped her head about 1-2 weeks ago. She said she was bending over and when she came back up right she hit the top of her head on an open counter door edge which broke the skin.   Description of  dizziness: vertigo, unsteadiness, woozy Frequency:daily Duration: seconds to minutes Symptom nature: motion provoked, positional, intermittent  Provocative Factors: rolling over and lying down/getting out of bed Easing Factors: staying still  Progression of symptoms:no change since onset History of similar episodes: yes   EXAMINATION  POSTURE:  Patient has rounded shoulders, forward head and slumped sitting posture  MUSCULOSKELETAL SCREEN: Cervical Spine ROM: Patient with decreased AROM cervical spine left and right rotation and extension, but WFL. Patient with full cervical AROM flexion.    Functional Mobility: Sit to stand from arm chair with CGA and imbalance noted with initially standing. Sit to stand from mat table with Min A. Patient required Min A of two persons with rolling over on the mat table during the Epley maneuver.   Gait: Patient arrives ambulating without AD. Patient ambulates with slow cadence with uneven steppage with partial step through gait pattern with decreased arm swing and antalgic gait pattern due to patient reports bone spurs in her left foot. Patient had one loss of balance at end of session when walking across gym floor requiring Min A to correct and required HHA for safety during gait this date as patient did not bring her [place]. Discussed with patient and her daughter that recommend using AD at all times secondary to patient is at high falls risk at present. Patient and her daughter verbalized understanding.  Scanning of visual environment with gait is: poor  Balance: Patient is challenged by ambulation of level surfaces and narrow base of support. From prior sessions, patient noted to also have difficulty with uneven surfaces, single leg stance, activities with head and body turning.   OCULOMOTOR / VESTIBULAR TESTING: Oculomotor Exam- Room Light: deferred secondary to positive left Dix-Hallpike test this date  BPPV TESTS:  Symptoms Duration Intensity  Nystagmus  Left Dix-Hallpike Vertigo Less than 1 minute 8/10 Left upbeating nystagmus of short duration without latency  Right Dix-Hallpike None   None observed   Canalith Repositioning Maneuver: On mat table, performed left Dix-Hallpike testing and it was positive with left upbeating torsional nystagmus of short duration without latency.  Performed left canalith repositioning maneuver (CRT). Repeated left CRT for a total of 3 maneuvers with retesting between maneuvers. Patient reported 8/10 vertigo with first and denied vertigo with last 2 maneuvers and no nystagmus was observed with last two, but performed to try to ensure clearance of particles.   Performed right Dix-Hallpike test and it was negative with no nystagmus observed and patient denied vertigo.     PT Education - 04/15/19 0915    Education Details  discussed BPPV, Epley maneuver and plan of caree    Person(s) Educated  Patient;Child(ren)   daughter   Methods  Explanation;Verbal cues;Tactile cues    Comprehension  Verbalized understanding;Verbal cues required       PT Short Term Goals - 04/15/19 1206      PT  SHORT TERM GOAL #1   Title  Patient will report 25% or greater improvement in her symptoms of dizziness and imbalance with provoking motions or positions    Baseline  04/14/19 patient reports 8/10 vertigo with provoking positions and movements;    Time  4    Period  Weeks    Status  New    Target Date  05/12/19        PT Long Term Goals - 04/15/19 1207      PT LONG TERM GOAL #1   Title  Patient will report 50% or greater improvement in her symptoms of dizziness and imbalance with provoking motions or positions    Baseline  On 02/19/2019, patient reports 100% improvement in her symptoms of dizziness and imbalance; on 04/14/19 patient reports 8/10 vertigo with provoking motions and positions    Time  12    Period  Weeks    Status  New    Target Date  06/09/19      PT LONG TERM GOAL #2   Title  Patient will  reduce perceived disability to low levels as indicated by <40 on Dizziness Handicap Inventory.    Baseline  scored 68/100 on 01/22/2019; scored 0/100 on 02/19/2019; will retest on next session 04/21/19    Time  12    Period  Weeks    Status  New    Target Date  06/09/19      PT LONG TERM GOAL #3   Title  Patient will reduce falls risk as indicated by improvement of 20% or more on Activities Specific Balance Confidence Scale (ABC) score to help reduce patient's falls risk.    Baseline  scored 30% on 01/22/2019; scored 51.9% on 02/19/2019; will retest on next session 04/21/19    Time  12    Period  Weeks    Status  New    Target Date  06/09/19      PT LONG TERM GOAL #4   Title  Patient will report that she has moderate difficulty with getting into/out of bed and chair on the FOTO survey to indicate improve function with her ADLs.    Baseline  scored Limited a Lot on intake FOTO survey for these two items on 01/21/2019; patient reports she does not have difficulty getting into/out of bed now. FOTO did not ask her these same questions on the survey, but patient's intake FOTO status score was 30/100 and this date it was 61/100 indicating significant improvement in overal status; on 04/14/19 patient reports that she is having difficulty with getting into/out of bed due to return of vertigo symptoms-will do FOTO survey next session on 04/21/19    Time  12    Period  Weeks    Status  New      PT LONG TERM GOAL #5   Title  --    Time  --    Period  --    Status  --      PT LONG TERM GOAL #6   Title  --    Time  --    Period  --    Status  --    Target Date  --      PT LONG TERM GOAL #7   Title  --    Baseline  --    Time  --    Period  --    Status  --            Plan -  04/15/19 1159    Clinical Impression Statement  Patient last seen in the clinic on 03/05/2019 and at that time her BPPV was resolved and she was being seen to addresss balance and strength deficits. Patient returns to  clinic this date reporting that she has begun to have episodes of vertigo again. Patient reports that she did strike her head on an open cabinet door about 1-2 weeks ago and she hit it with enough force to break the skin on her scalp. Patient reports she began to have dizziness after that incident. Patient with positive left Dix-Hallpike test and negative right Dix-Hallpike test this date. Performed 3 repetitions of the Epley maneuver to try to get clearance of left particles. Patient is unsteady on her feet and had a loss of balance while walking in the clinic requiring Min A to correct. Encouraged patient to use her SPC at all times, especially as she is at high risk of falls. Patient and her daughter are in agreementment. Patient would benefit from PT services to address positive left Dix-Hallpike test which could be indicative of left posterior canalithiasis, to address functional deficits especially strength and balance deficits and goals as set on plan of care.    Personal Factors and Comorbidities  Comorbidity 3+;Time since onset of injury/illness/exacerbation;Past/Current Experience    Comorbidities  CAD, depression, A-fib, HTN, Right occipital neuralgia and Grave's Disease    Examination-Activity Limitations  Bed Mobility;Bend;Reach Overhead;Locomotion Level    Examination-Participation Restrictions  Cleaning    Stability/Clinical Decision Making  Evolving/Moderate complexity    Clinical Decision Making  Moderate    Rehab Potential  Fair    PT Frequency  1x / week    PT Duration  12 weeks    PT Treatment/Interventions  Canalith Repostioning;Neuromuscular re-education;Balance training;Therapeutic exercise;Therapeutic activities;Stair training;Gait training;Patient/family education;Vestibular;ADLs/Self Care Home Management;Functional mobility training    PT Next Visit Plan  Dix-Hallpike testing and CRT if indicated    Consulted and Agree with Plan of Care  Patient;Family member/caregiver        Patient will benefit from skilled therapeutic intervention in order to improve the following deficits and impairments:  Difficulty walking, Decreased safety awareness, Dizziness, Decreased activity tolerance, Pain, Decreased balance, Decreased strength, Decreased mobility  Visit Diagnosis: BPPV (benign paroxysmal positional vertigo), left  Unsteadiness on feet  Dizziness and giddiness     Problem List Patient Active Problem List   Diagnosis Date Noted  . Aortic atherosclerosis ([place]) 02/12/2013  . Abdominal pain, chronic, right lower quadrant 02/12/2013  . Weakness of limb 02/12/2013    Lady Deutscher PT, DPT [phone] Lady Deutscher 04/15/2019, 12:25 PM  [place] [facility] [address] [place], [place], [address] Phone: [phone]   Fax:  [phone]  Name: Cynthia Raymond MRN: [id number] Date of Birth: 1929/08/05

## 2019-04-21 ENCOUNTER — Encounter: Payer: Self-pay | Admitting: Physical Therapy

## 2019-04-21 ENCOUNTER — Other Ambulatory Visit: Payer: Self-pay

## 2019-04-21 ENCOUNTER — Ambulatory Visit: Payer: Medicare Other | Admitting: Physical Therapy

## 2019-04-21 VITALS — BP 117/69 | HR 74

## 2019-04-21 DIAGNOSIS — R42 Dizziness and giddiness: Secondary | ICD-10-CM

## 2019-04-21 DIAGNOSIS — H8112 Benign paroxysmal vertigo, left ear: Secondary | ICD-10-CM

## 2019-04-21 DIAGNOSIS — R2681 Unsteadiness on feet: Secondary | ICD-10-CM

## 2019-04-21 NOTE — Therapy (Signed)
[facility] [place] [facility] [address] [place], [place], [address] Phone: [phone]   Fax:  [phone]  Physical Therapy Treatment  Patient Details  Name: Cynthia Raymond MRN: [id number] Date of Birth: Oct 20, 1929 Referring Provider (PT): Dr. Jennings Books   Encounter Date: 04/21/2019    Past Medical History:  Diagnosis Date  . Cancer ([place])    breast  . Graves disease   . Hypertension   . Osteoporosis   . Thyroid disease     Past Surgical History:  Procedure Laterality Date  . BACK SURGERY     X3  . BREAST LUMPECTOMY    . CATARACT EXTRACTION W/PHACO Left 02/18/2018   Procedure: CATARACT EXTRACTION PHACO AND INTRAOCULAR LENS PLACEMENT ([place]) LEFT;  Surgeon: Leandrew Koyanagi, MD;  Location: [place];  Service: Ophthalmology;  Laterality: Left;  . CATARACT EXTRACTION W/PHACO Right 03/17/2018   Procedure: CATARACT EXTRACTION PHACO AND INTRAOCULAR LENS PLACEMENT ([place])  RIGHT;  Surgeon: Leandrew Koyanagi, MD;  Location: [place];  Service: Ophthalmology;  Laterality: Right;  . ELBOW SURGERY Right   . HAND SURGERY Right   . JOINT REPLACEMENT Bilateral    Both knees, left shoulder  . ROTATOR CUFF REPAIR Bilateral     Vitals:   04/21/19 1703  BP: 117/69  Pulse: 74  SpO2: 97%    Subjective Assessment - 04/21/19 1704    Subjective  Patient arrives to the clinic with her daughter and they report that during the car ride to the clinic, patient began to have increasing dizziness and nausea.    Pertinent History  Patient reports she began having dizziness early last year in 2020 that has worsened. Patient's daughter reports that patient has had poor balance for many years due to patient's back and hip issues. Patient describes her dizziness as vertigo, light-headedness upon standing, imbalance and unsteadiness especially with ambulation. Patient reports nausea at times due to dizziness. Patient reports that she veers  when she walks. Patient reports multiple daily episodes of vertigo that last seconds to minutes. Patient reports rolling over in bed, getting into/out of bed, standing, quick movements, head turns, reaching up, looking up all provoke her symptoms of dizziness. Patient reports sitting and lying still help decrease her symptoms. Patient reports she tried Meclizine but reported that it did not help with the dizziness and made her sleepy therefore she stopped taking the Meclizine. Patient has not been seen by ENT or cardiology per MR. Patient had CT head that was negative for acute abnormality per MR. Patient with headache in right temporal region and diagnosis of right occipital neuralgia per medical record.    Diagnostic tests  CT head-no acute abnormatility    Patient Stated Goals  to have decreased dizziness; to be better able to get into/out of bed and chair      Patient's daughter states that the patient had really bad dizziness for about a day and a half after the last session and then it started to get better.   Patient arrives to the clinic with her daughter and they report that during the car ride to the clinic, patient began to have increasing dizziness and nausea. Patient states she is nauseous and dizzy today, but states it feels different than before.Patient reports that her head is hurting as well, denies numbness and tingling, denies slurred speech or difficulty swallowing and no facial weakness observed. Discussed signs and symptoms of stroke with patient and her daughter. Patient not able to tolerate  treatment this date. Patient's vital signs obtained. Patient's daughter states she will monitor patient. Discussed if patient should worsen or have any signs or symptoms of a stroke to go to the ER.    PT Short Term Goals - 04/15/19 1206      PT SHORT TERM GOAL #1   Title  Patient will report 25% or greater improvement in her symptoms of dizziness and imbalance with provoking motions or  positions    Baseline  04/14/19 patient reports 8/10 vertigo with provoking positions and movements;    Time  4    Period  Weeks    Status  New    Target Date  05/12/19        PT Long Term Goals - 04/15/19 1207      PT LONG TERM GOAL #1   Title  Patient will report 50% or greater improvement in her symptoms of dizziness and imbalance with provoking motions or positions    Baseline  On 02/19/2019, patient reports 100% improvement in her symptoms of dizziness and imbalance; on 04/14/19 patient reports 8/10 vertigo with provoking motions and positions    Time  12    Period  Weeks    Status  New    Target Date  06/09/19      PT LONG TERM GOAL #2   Title  Patient will reduce perceived disability to low levels as indicated by <40 on Dizziness Handicap Inventory.    Baseline  scored 68/100 on 01/22/2019; scored 0/100 on 02/19/2019; will retest on next session 04/21/19    Time  12    Period  Weeks    Status  New    Target Date  06/09/19      PT LONG TERM GOAL #3   Title  Patient will reduce falls risk as indicated by improvement of 20% or more on Activities Specific Balance Confidence Scale (ABC) score to help reduce patient's falls risk.    Baseline  scored 30% on 01/22/2019; scored 51.9% on 02/19/2019; will retest on next session 04/21/19    Time  12    Period  Weeks    Status  New    Target Date  06/09/19      PT LONG TERM GOAL #4   Title  Patient will report that she has moderate difficulty with getting into/out of bed and chair on the FOTO survey to indicate improve function with her ADLs.    Baseline  scored Limited a Lot on intake FOTO survey for these two items on 01/21/2019; patient reports she does not have difficulty getting into/out of bed now. FOTO did not ask her these same questions on the survey, but patient's intake FOTO status score was 30/100 and this date it was 61/100 indicating significant improvement in overal status; on 04/14/19 patient reports that she is having difficulty  with getting into/out of bed due to return of vertigo symptoms-will do FOTO survey next session on 04/21/19    Time  12    Period  Weeks    Status  New      PT LONG TERM GOAL #5   Title  --    Time  --    Period  --    Status  --      PT LONG TERM GOAL #6   Title  --    Time  --    Period  --    Status  --    Target Date  --  PT LONG TERM GOAL #7   Title  --    Baseline  --    Time  --    Period  --    Status  --              Patient will benefit from skilled therapeutic intervention in order to improve the following deficits and impairments:     Visit Diagnosis: No diagnosis found.     Problem List Patient Active Problem List   Diagnosis Date Noted  . Aortic atherosclerosis ([place]) 02/12/2013  . Abdominal pain, chronic, right lower quadrant 02/12/2013  . Weakness of limb 02/12/2013   Lady Deutscher PT, DPT [phone] Lady Deutscher 04/21/2019, 5:13 PM  [place] [facility] [address] [place], [place], [address] Phone: [phone]   Fax:  [phone]  Name: Cynthia Raymond MRN: [id number] Date of Birth: 01-17-1929

## 2019-04-28 ENCOUNTER — Ambulatory Visit: Payer: Medicare Other | Admitting: Physical Therapy

## 2019-04-28 ENCOUNTER — Other Ambulatory Visit: Payer: Self-pay

## 2019-04-28 ENCOUNTER — Encounter: Payer: Self-pay | Admitting: Physical Therapy

## 2019-04-28 DIAGNOSIS — H8112 Benign paroxysmal vertigo, left ear: Secondary | ICD-10-CM | POA: Diagnosis not present

## 2019-04-28 DIAGNOSIS — R42 Dizziness and giddiness: Secondary | ICD-10-CM

## 2019-04-28 DIAGNOSIS — R2681 Unsteadiness on feet: Secondary | ICD-10-CM

## 2019-04-28 NOTE — Therapy (Signed)
[facility] [address] [place], [place], [address] Phone: [phone]   Fax:  [phone]  Physical Therapy Treatment  Patient Details  Name: Cynthia Raymond MRN: [id number] Date of Birth: 1929-04-17 Referring Provider (PT): Dr. Jennings Books   Encounter Date: 04/28/2019  PT End of Session - 04/28/19 1513    Visit Number  8    Number of Visits  16    Date for PT Re-Evaluation  06/09/19    PT Start Time  1510    PT Stop Time  1550    PT Time Calculation (min)  40 min    Activity Tolerance  Patient tolerated treatment well    Behavior During Therapy  [facility] for tasks assessed/performed       Past Medical History:  Diagnosis Date  . Cancer ([place])    breast  . Graves disease   . Hypertension   . Osteoporosis   . Thyroid disease     Past Surgical History:  Procedure Laterality Date  . BACK SURGERY     X3  . BREAST LUMPECTOMY    . CATARACT EXTRACTION W/PHACO Left 02/18/2018   Procedure: CATARACT EXTRACTION PHACO AND INTRAOCULAR LENS PLACEMENT ([place]) LEFT;  Surgeon: Leandrew Koyanagi, MD;  Location: [place];  Service: Ophthalmology;  Laterality: Left;  . CATARACT EXTRACTION W/PHACO Right 03/17/2018   Procedure: CATARACT EXTRACTION PHACO AND INTRAOCULAR LENS PLACEMENT ([place])  RIGHT;  Surgeon: Leandrew Koyanagi, MD;  Location: [place];  Service: Ophthalmology;  Laterality: Right;  . ELBOW SURGERY Right   . HAND SURGERY Right   . JOINT REPLACEMENT Bilateral    Both knees, left shoulder  . ROTATOR CUFF REPAIR Bilateral     There were no vitals filed for this visit.  Subjective Assessment - 04/28/19 1512    Subjective  Patient reports she has not had any episodes of vertigo since last seen for Epley maneuver. Patient states that last week she felt sick for 24 hours with stomache, headache and dizziness and that her daughter stayed with her.    Pertinent History  Patient reports she began having  dizziness early last year in 2020 that has worsened. Patient's daughter reports that patient has had poor balance for many years due to patient's back and hip issues. Patient describes her dizziness as vertigo, light-headedness upon standing, imbalance and unsteadiness especially with ambulation. Patient reports nausea at times due to dizziness. Patient reports that she veers when she walks. Patient reports multiple daily episodes of vertigo that last seconds to minutes. Patient reports rolling over in bed, getting into/out of bed, standing, quick movements, head turns, reaching up, looking up all provoke her symptoms of dizziness. Patient reports sitting and lying still help decrease her symptoms. Patient reports she tried Meclizine but reported that it did not help with the dizziness and made her sleepy therefore she stopped taking the Meclizine. Patient has not been seen by ENT or cardiology per MR. Patient had CT head that was negative for acute abnormality per MR. Patient with headache in right temporal region and diagnosis of right occipital neuralgia per medical record.    Diagnostic tests  CT head-no acute abnormatility    Patient Stated Goals  to have decreased dizziness; to be better able to get into/out of bed and chair         Canalith Repositioning Maneuver: On mat table, performed left Dix-Hallpike testing and it was positive with left upbeating torsional nystagmus of short duration  without latency that lasted about 20 seconds.  Performed left canalith repositioning maneuver (CRT). Repeated left CRT for a total of 3 maneuver. Patient did not have nystagmus observed with last two maneuvers, but performed to try to ensure clearance of particles. Patient reported 7/10 vertigo with first and denied vertigo with last two maneuvers.     PT Education - 04/28/19 1513    Education Details  patient asked about BPPV so discussed BPPV and showed BPPV video from [email or web address].org    Person(s) Educated   Patient    Methods  Explanation;Other (comment)   video   Comprehension  Verbalized understanding       PT Short Term Goals - 04/15/19 1206      PT SHORT TERM GOAL #1   Title  Patient will report 25% or greater improvement in her symptoms of dizziness and imbalance with provoking motions or positions    Baseline  04/14/19 patient reports 8/10 vertigo with provoking positions and movements;    Time  4    Period  Weeks    Status  New    Target Date  05/12/19        PT Long Term Goals - 04/15/19 1207      PT LONG TERM GOAL #1   Title  Patient will report 50% or greater improvement in her symptoms of dizziness and imbalance with provoking motions or positions    Baseline  On 02/19/2019, patient reports 100% improvement in her symptoms of dizziness and imbalance; on 04/14/19 patient reports 8/10 vertigo with provoking motions and positions    Time  12    Period  Weeks    Status  New    Target Date  06/09/19      PT LONG TERM GOAL #2   Title  Patient will reduce perceived disability to low levels as indicated by <40 on Dizziness Handicap Inventory.    Baseline  scored 68/100 on 01/22/2019; scored 0/100 on 02/19/2019; will retest on next session 04/21/19    Time  12    Period  Weeks    Status  New    Target Date  06/09/19      PT LONG TERM GOAL #3   Title  Patient will reduce falls risk as indicated by improvement of 20% or more on Activities Specific Balance Confidence Scale (ABC) score to help reduce patient's falls risk.    Baseline  scored 30% on 01/22/2019; scored 51.9% on 02/19/2019; will retest on next session 04/21/19    Time  12    Period  Weeks    Status  New    Target Date  06/09/19      PT LONG TERM GOAL #4   Title  Patient will report that she has moderate difficulty with getting into/out of bed and chair on the FOTO survey to indicate improve function with her ADLs.    Baseline  scored Limited a Lot on intake FOTO survey for these two items on 01/21/2019; patient reports  she does not have difficulty getting into/out of bed now. FOTO did not ask her these same questions on the survey, but patient's intake FOTO status score was 30/100 and this date it was 61/100 indicating significant improvement in overal status; on 04/14/19 patient reports that she is having difficulty with getting into/out of bed due to return of vertigo symptoms-will do FOTO survey next session on 04/21/19    Time  12    Period  Weeks    Status  New      PT LONG TERM GOAL #5   Title  --    Time  --    Period  --    Status  --      PT LONG TERM GOAL #6   Title  --    Time  --    Period  --    Status  --    Target Date  --      PT LONG TERM GOAL #7   Title  --    Baseline  --    Time  --    Period  --    Status  --            Plan - 04/28/19 1624    Clinical Impression Statement  Patient reports that she has not had any episodes of vertigo this past week. Patient however with positive left Dix-Hallpike test with patient reporting 7/10 vertigo and nystagmus was observed. Performed 3 Epley maneuvers to try to obtain clearance of particles. Patient would benefit from continued PT services to address goals and until left BPPV particles are cleared.    Personal Factors and Comorbidities  Comorbidity 3+;Time since onset of injury/illness/exacerbation;Past/Current Experience    Comorbidities  CAD, depression, A-fib, HTN, Right occipital neuralgia and Grave's Disease    Examination-Activity Limitations  Bed Mobility;Bend;Reach Overhead;Locomotion Level    Examination-Participation Restrictions  Cleaning    Stability/Clinical Decision Making  Evolving/Moderate complexity    Rehab Potential  Fair    PT Frequency  1x / week    PT Duration  12 weeks    PT Treatment/Interventions  Canalith Repostioning;Neuromuscular re-education;Balance training;Therapeutic exercise;Therapeutic activities;Stair training;Gait training;Patient/family education;Vestibular;ADLs/Self Care Home  Management;Functional mobility training    PT Next Visit Plan  Dix-Hallpike testing and CRT if indicated    Consulted and Agree with Plan of Care  Patient;Family member/caregiver       Patient will benefit from skilled therapeutic intervention in order to improve the following deficits and impairments:  Difficulty walking, Decreased safety awareness, Dizziness, Decreased activity tolerance, Pain, Decreased balance, Decreased strength, Decreased mobility  Visit Diagnosis: BPPV (benign paroxysmal positional vertigo), left  Unsteadiness on feet  Dizziness and giddiness     Problem List Patient Active Problem List   Diagnosis Date Noted  . Aortic atherosclerosis ([place]) 02/12/2013  . Abdominal pain, chronic, right lower quadrant 02/12/2013  . Weakness of limb 02/12/2013   Lady Deutscher PT, DPT [phone] Lady Deutscher 04/28/2019, 4:26 PM  [facility] [address] [place], [place], [address] Phone: [phone]   Fax:  [phone]  Name: Chardonae Oller MRN: [id number] Date of Birth: 23-Jul-1929

## 2019-06-28 ENCOUNTER — Emergency Department: Payer: Medicare Other

## 2019-06-28 ENCOUNTER — Encounter: Payer: Self-pay | Admitting: Emergency Medicine

## 2019-06-28 ENCOUNTER — Other Ambulatory Visit: Payer: Self-pay

## 2019-06-28 ENCOUNTER — Emergency Department
Admission: EM | Admit: 2019-06-28 | Discharge: 2019-06-28 | Disposition: A | Discharge status: Home / Self Care | Payer: Medicare Other | Attending: Emergency Medicine | Admitting: Emergency Medicine

## 2019-06-28 DIAGNOSIS — M25511 Pain in right shoulder: Secondary | ICD-10-CM | POA: Insufficient documentation

## 2019-06-28 DIAGNOSIS — Y92012 Bathroom of single-family (private) house as the place of occurrence of the external cause: Secondary | ICD-10-CM | POA: Diagnosis not present

## 2019-06-28 DIAGNOSIS — I10 Essential (primary) hypertension: Secondary | ICD-10-CM | POA: Diagnosis not present

## 2019-06-28 DIAGNOSIS — Z96652 Presence of left artificial knee joint: Secondary | ICD-10-CM | POA: Diagnosis not present

## 2019-06-28 DIAGNOSIS — Z853 Personal history of malignant neoplasm of breast: Secondary | ICD-10-CM | POA: Insufficient documentation

## 2019-06-28 DIAGNOSIS — M25512 Pain in left shoulder: Secondary | ICD-10-CM | POA: Diagnosis not present

## 2019-06-28 DIAGNOSIS — Z20822 Contact with and (suspected) exposure to covid-19: Secondary | ICD-10-CM | POA: Diagnosis not present

## 2019-06-28 DIAGNOSIS — Y939 Activity, unspecified: Secondary | ICD-10-CM | POA: Diagnosis not present

## 2019-06-28 DIAGNOSIS — Z7982 Long term (current) use of aspirin: Secondary | ICD-10-CM | POA: Diagnosis not present

## 2019-06-28 DIAGNOSIS — Z96651 Presence of right artificial knee joint: Secondary | ICD-10-CM | POA: Diagnosis not present

## 2019-06-28 DIAGNOSIS — M542 Cervicalgia: Secondary | ICD-10-CM | POA: Diagnosis not present

## 2019-06-28 DIAGNOSIS — M25551 Pain in right hip: Secondary | ICD-10-CM | POA: Diagnosis present

## 2019-06-28 DIAGNOSIS — Z91040 Latex allergy status: Secondary | ICD-10-CM | POA: Insufficient documentation

## 2019-06-28 DIAGNOSIS — R519 Headache, unspecified: Secondary | ICD-10-CM | POA: Insufficient documentation

## 2019-06-28 DIAGNOSIS — Z96612 Presence of left artificial shoulder joint: Secondary | ICD-10-CM | POA: Insufficient documentation

## 2019-06-28 DIAGNOSIS — Z79899 Other long term (current) drug therapy: Secondary | ICD-10-CM | POA: Diagnosis not present

## 2019-06-28 DIAGNOSIS — T07XXXA Unspecified multiple injuries, initial encounter: Secondary | ICD-10-CM | POA: Diagnosis not present

## 2019-06-28 DIAGNOSIS — W19XXXA Unspecified fall, initial encounter: Secondary | ICD-10-CM | POA: Insufficient documentation

## 2019-06-28 DIAGNOSIS — Z87891 Personal history of nicotine dependence: Secondary | ICD-10-CM | POA: Insufficient documentation

## 2019-06-28 DIAGNOSIS — Y998 Other external cause status: Secondary | ICD-10-CM | POA: Diagnosis not present

## 2019-06-28 LAB — COMPREHENSIVE METABOLIC PANEL
ALT: 17 U/L (ref 0–44)
AST: 23 U/L (ref 15–41)
Albumin: 3.7 g/dL (ref 3.5–5.0)
Alkaline Phosphatase: 58 U/L (ref 38–126)
Anion gap: 9 (ref 5–15)
BUN: 19 mg/dL (ref 8–23)
CO2: 28 mmol/L (ref 22–32)
Calcium: 8.9 mg/dL (ref 8.9–10.3)
Chloride: 102 mmol/L (ref 98–111)
Creatinine, Ser: 1.03 mg/dL — ABNORMAL HIGH (ref 0.44–1.00)
GFR calc Af Amer: 57 mL/min — ABNORMAL LOW (ref >60)
GFR calc non Af Amer: 49 mL/min — ABNORMAL LOW (ref >60)
Glucose, Bld: 91 mg/dL (ref 70–99)
Potassium: 4 mmol/L (ref 3.5–5.1)
Sodium: 139 mmol/L (ref 135–145)
Total Bilirubin: 0.9 mg/dL (ref 0.3–1.2)
Total Protein: 7.3 g/dL (ref 6.5–8.1)

## 2019-06-28 LAB — CBC WITH DIFFERENTIAL/PLATELET
Abs Immature Granulocytes: 0.02 10*3/uL (ref 0.00–0.07)
Basophils Absolute: 0 10*3/uL (ref 0.0–0.1)
Basophils Relative: 1 %
Eosinophils Absolute: 0 10*3/uL (ref 0.0–0.5)
Eosinophils Relative: 0 %
HCT: 39 % (ref 36.0–46.0)
Hemoglobin: 13.4 g/dL (ref 12.0–15.0)
Immature Granulocytes: 0 %
Lymphocytes Relative: 27 %
Lymphs Abs: 1.7 10*3/uL (ref 0.7–4.0)
MCH: 31.7 pg (ref 26.0–34.0)
MCHC: 34.4 g/dL (ref 30.0–36.0)
MCV: 92.2 fL (ref 80.0–100.0)
Monocytes Absolute: 0.6 10*3/uL (ref 0.1–1.0)
Monocytes Relative: 10 %
Neutro Abs: 3.9 10*3/uL (ref 1.7–7.7)
Neutrophils Relative %: 62 %
Platelets: 211 10*3/uL (ref 150–400)
RBC: 4.23 MIL/uL (ref 3.87–5.11)
RDW: 12.7 % (ref 11.5–15.5)
WBC: 6.3 10*3/uL (ref 4.0–10.5)
nRBC: 0 % (ref 0.0–0.2)

## 2019-06-28 LAB — SARS CORONAVIRUS 2 BY RT PCR (HOSPITAL ORDER, PERFORMED IN ~~LOC~~ HOSPITAL LAB): SARS Coronavirus 2: NEGATIVE

## 2019-06-28 MED ORDER — FENTANYL CITRATE (PF) 100 MCG/2ML IJ SOLN
100.0000 ug | Freq: Once | INTRAMUSCULAR | Status: AC
  Administered 2019-06-28: 100 ug via INTRAVENOUS
  Filled 2019-06-28: qty 2

## 2019-06-28 MED ORDER — TRAMADOL HCL 50 MG PO TABS
50.0000 mg | ORAL_TABLET | Freq: Once | ORAL | Status: AC
  Administered 2019-06-28: 50 mg via ORAL
  Filled 2019-06-28: qty 1

## 2019-06-28 MED ORDER — TRAMADOL HCL 50 MG PO TABS: 50.0000 mg | ORAL_TABLET | Freq: Four times a day (QID) | ORAL | 0 refills | Status: DC | PRN

## 2019-06-28 NOTE — ED Provider Notes (Signed)
[facility] Emergency Department Provider Note  ____________________________________________   First MD Initiated Contact with Patient 06/28/19 1108     (approximate)  I have reviewed the triage vital signs and the nursing notes.   HISTORY  Chief Complaint Fall    HPI Kandra Graven is a 86 y.o. female presents to emergency department via EMS after a fall.  Patient is unsure and seems a little confused about how she fell.  States she was in the bathroom.  Complaining of pain to the right hip as she landed on her right side.  Complaining of neck pain and headache.  Also both shoulders hurt.  States she has had multiple joint replacements.  Unsure if she had LOC.   Pain scale is 6/10.  Patient has not been ambulatory since fall   Past Medical History:  Diagnosis Date   Cancer ([place])    breast   Graves disease    Hypertension    Osteoporosis    Thyroid disease     Patient Active Problem List   Diagnosis Date Noted   Aortic atherosclerosis ([place]) 02/12/2013   Abdominal pain, chronic, right lower quadrant 02/12/2013   Weakness of limb 02/12/2013    Past Surgical History:  Procedure Laterality Date   BACK SURGERY     X3   BREAST LUMPECTOMY     CATARACT EXTRACTION W/PHACO Left 02/18/2018   Procedure: CATARACT EXTRACTION PHACO AND INTRAOCULAR LENS PLACEMENT ([place]) LEFT;  Surgeon: Leandrew Koyanagi, MD;  Location: [facility];  Service: Ophthalmology;  Laterality: Left;   CATARACT EXTRACTION W/PHACO Right 03/17/2018   Procedure: CATARACT EXTRACTION PHACO AND INTRAOCULAR LENS PLACEMENT ([place])  RIGHT;  Surgeon: Leandrew Koyanagi, MD;  Location: [place];  Service: Ophthalmology;  Laterality: Right;   ELBOW SURGERY Right    HAND SURGERY Right    JOINT REPLACEMENT Bilateral    Both knees, left shoulder   ROTATOR CUFF REPAIR Bilateral     Prior to Admission medications   Medication Sig Start Date End Date  Taking? Authorizing Provider  aspirin 81 MG tablet Take 81 mg by mouth daily.    [provider]  Cholecalciferol (VITAMIN D PO) Take 2 capsules by mouth daily.    [provider]  Cyanocobalamin (VITAMIN B 12 PO) Take 1 tablet by mouth daily.    [provider]  levothyroxine (SYNTHROID, LEVOTHROID) 150 MCG tablet Take 150 mcg by mouth daily before breakfast.    [provider]  ramipril (ALTACE) 10 MG capsule Take 10 mg by mouth daily.    [provider]  traMADol (ULTRAM) 50 MG tablet Take 1 tablet (50 mg total) by mouth every 6 (six) hours as needed. 06/28/19   Caryn Section Linden Dolin, PA-C  venlafaxine XR (EFFEXOR-XR) 150 MG 24 hr capsule Take 150 mg by mouth daily with breakfast.    [provider]  vitamin C (ASCORBIC ACID) 500 MG tablet Take 500 mg by mouth daily.    [provider]    Allergies Penicillins, Amitriptyline, Latex, Morphine and related, and Sulfa antibiotics  Family History  Problem Relation Age of Onset   Cancer Mother    Heart attack Father    Cancer Sister    Heart disease Sister    Hyperlipidemia Sister    Heart attack Sister    Cancer Brother    Cancer Daughter     Social History Social History   Tobacco Use   Smoking status: Former Smoker  Quit date: 01/08/1987    Years since quitting: 32.4   Smokeless tobacco: Never Used   Tobacco comment: Quit 1989  Substance Use Topics   Alcohol use: No   Drug use: No    Review of Systems  Constitutional: No fever/chills, positive headache Eyes: No visual changes. ENT: No sore throat. Respiratory: Denies cough Cardiovascular: Denies chest pain Gastrointestinal: Denies abdominal pain Genitourinary: Negative for dysuria. Musculoskeletal: Negative for back pain.,  Positive for neck pain, bilateral shoulder pain, right hip pain, right leg pain Skin: Negative for rash. Psychiatric: no mood changes,      ____________________________________________   PHYSICAL EXAM:  VITAL SIGNS: ED Triage Vitals [06/28/19 1052]  Enc Vitals Group     BP (!) 123/52     Pulse Rate 76     Resp 18     Temp 98.2 F (36.8 C)     Temp Source Oral     SpO2 94 %     Weight 205 lb (93 kg)     Height 5\' 3"  (1.6 m)     Head Circumference      Peak Flow      Pain Score 6     Pain Loc      Pain Edu?      Excl. in [place]?     Constitutional: Alert and oriented. Well appearing and in no acute distress.  Is able to answer my questions appropriately, however she does have some confusion about how she fell Eyes: Conjunctivae are normal.  Head: Atraumatic. Nose: No congestion/rhinnorhea. Mouth/Throat: Mucous membranes are moist.   Neck:  supple no lymphadenopathy noted Cardiovascular: Normal rate, regular rhythm. Heart sounds are normal Respiratory: Normal respiratory effort.  No retractions, lungs c t a  Abd: soft nontender bs normal all 4 quad GU: deferred Musculoskeletal: C-spine is tender, shoulders are tender bilaterally, right hip is extremely tender, did not perform range of motion due to the amount of pain, right knee is tender, neurovascular is intact, there does seem to be a small amount of right leg shortening  neurologic:  Normal speech and language.  Skin:  Skin is warm, dry and intact. No rash noted. Psychiatric: Mood and affect are normal. Speech and behavior are normal.  ____________________________________________   LABS (all labs ordered are listed, but only abnormal results are displayed)  Labs Reviewed  COMPREHENSIVE METABOLIC PANEL - Abnormal; Notable for the following components:      Result Value   Creatinine, Ser 1.03 (*)    GFR calc non Af Amer 49 (*)    GFR calc Af Amer 57 (*)    All other components within normal limits  SARS CORONAVIRUS 2 BY RT PCR (HOSPITAL ORDER, [place] [facility])  CBC WITH DIFFERENTIAL/PLATELET    ____________________________________________   ____________________________________________  RADIOLOGY  CT of the head and C-spine X-ray of the right and left humerus. X-ray of the right hip and right knee   ____________________________________________   PROCEDURES  Procedure(s) performed: Saline lock, fentanyl 100 mcg IV   Procedures    ____________________________________________   INITIAL IMPRESSION / ASSESSMENT AND PLAN / ED COURSE  Pertinent labs & imaging results that were available during my care of the patient were reviewed by me and considered in my medical decision making (see chart for details).   Patient is 86 year old female presents emergency department via EMS after fall at home.  See HPI  Physical exam shows patient to be in a fair amount of pain.  She is little confused on how she fell.  C-spine is tender, right shoulder and left shoulder are both tender, right hip and right knee are both tender, neurovascular intact.  Cranial exams unremarkable  DDx: Right hip fracture, ramus fracture, femur fracture, C-spine fracture, fracture of the humerus bilaterally, subdural hematoma, TBI  CBC, comprehensive metabolic panel, type and screen in case of pending surgery, Covid test for questionable pending surgery  CT of the head, C-spine, x-ray of the right humerus left humerus, right hip, right knee are all negative  Do have concerns about the right hip, even though the x-ray is negative patient still complaining of a large amount of pain so I did a CT of the pelvis to rule out fracture in the hip  CT of the pelvis is negative  Patient was given fentanyl 100 mcg IV.  She is more comfortable at this time.  She was able to stand and ambulate with assistance of the nursing staff.  I do feel at this time should be able to be discharged.  Her lab work is reassuring and is normal.    Her daughter states that she will be able to drive her home and make sure she has  her walker available.  She is given a prescription for tramadol 50 mg p.o., #15.  Strict precautions due to patient's age to try Tylenol first and if this does not work she could take the tramadol.  They both state they understand.  Patient was discharged in stable condition.    Ian Cavey was evaluated in Emergency Department on 06/28/2019 for the symptoms described in the history of present illness. She was evaluated in the context of the global COVID-19 pandemic, which necessitated consideration that the patient might be at risk for infection with the SARS-CoV-2 virus that causes COVID-19. Institutional protocols and algorithms that pertain to the evaluation of patients at risk for COVID-19 are in a state of rapid change based on information released by regulatory bodies including the CDC and federal and state organizations. These policies and algorithms were followed during the patient's care in the ED.   As part of my medical decision making, I reviewed the following data within the [place] History obtained from family, Nursing notes reviewed and incorporated, Labs reviewed , Old chart reviewed, Radiograph reviewed , Notes from prior ED visits and [place] Controlled Substance Database  ____________________________________________   FINAL CLINICAL IMPRESSION(S) / ED DIAGNOSES  Final diagnoses:  Fall, initial encounter  Multiple contusions      NEW MEDICATIONS STARTED DURING THIS VISIT:  New Prescriptions   TRAMADOL (ULTRAM) 50 MG TABLET    Take 1 tablet (50 mg total) by mouth every 6 (six) hours as needed.     Note:  This document was prepared using Dragon voice recognition software and may include unintentional dictation errors.    Versie Starks, PA-C 06/28/19 1507    Vanessa Rosebud, MD 06/29/19 [phone]

## 2019-06-28 NOTE — Discharge Instructions (Addendum)
Follow-up with your regular doctor if not improving in 5 to 7 days.  Return emergency department worsening.  Take the tramadol for pain not controlled by Tylenol.  Apply ice to any areas that hurt.  Use your walker for the next few days due to the pain I do not want you to fall again.

## 2019-06-28 NOTE — ED Notes (Signed)
Pt ambulated in room without difficulty. Pt cleaned up and assisted back to bed.

## 2019-06-28 NOTE — ED Triage Notes (Signed)
Presents via EMS  S/p fall  States she fell from shower  Having pain to right hip and both shoulders

## 2019-10-18 ENCOUNTER — Emergency Department
Admission: EM | Admit: 2019-10-18 | Discharge: 2019-10-18 | Disposition: A | Discharge status: Home / Self Care | Payer: Medicare Other | Attending: Student in an Organized Health Care Education/Training Program | Admitting: Student in an Organized Health Care Education/Training Program

## 2019-10-18 ENCOUNTER — Emergency Department: Payer: Medicare Other

## 2019-10-18 ENCOUNTER — Other Ambulatory Visit: Payer: Self-pay

## 2019-10-18 DIAGNOSIS — R410 Disorientation, unspecified: Secondary | ICD-10-CM | POA: Diagnosis not present

## 2019-10-18 DIAGNOSIS — R531 Weakness: Secondary | ICD-10-CM | POA: Insufficient documentation

## 2019-10-18 DIAGNOSIS — Z5321 Procedure and treatment not carried out due to patient leaving prior to being seen by health care provider: Secondary | ICD-10-CM | POA: Diagnosis not present

## 2019-10-18 DIAGNOSIS — R519 Headache, unspecified: Secondary | ICD-10-CM | POA: Diagnosis not present

## 2019-10-18 LAB — CBC
HCT: 39.2 % (ref 36.0–46.0)
Hemoglobin: 13.2 g/dL (ref 12.0–15.0)
MCH: 32.3 pg (ref 26.0–34.0)
MCHC: 33.7 g/dL (ref 30.0–36.0)
MCV: 95.8 fL (ref 80.0–100.0)
Platelets: 242 10*3/uL (ref 150–400)
RBC: 4.09 MIL/uL (ref 3.87–5.11)
RDW: 12.9 % (ref 11.5–15.5)
WBC: 6.5 10*3/uL (ref 4.0–10.5)
nRBC: 0 % (ref 0.0–0.2)

## 2019-10-18 LAB — BASIC METABOLIC PANEL
Anion gap: 10 (ref 5–15)
BUN: 19 mg/dL (ref 8–23)
CO2: 26 mmol/L (ref 22–32)
Calcium: 9.3 mg/dL (ref 8.9–10.3)
Chloride: 105 mmol/L (ref 98–111)
Creatinine, Ser: 1.06 mg/dL — ABNORMAL HIGH (ref 0.44–1.00)
GFR, Estimated: 47 mL/min — ABNORMAL LOW (ref >60)
Glucose, Bld: 101 mg/dL — ABNORMAL HIGH (ref 70–99)
Potassium: 3.8 mmol/L (ref 3.5–5.1)
Sodium: 141 mmol/L (ref 135–145)

## 2019-10-18 LAB — TROPONIN I (HIGH SENSITIVITY): Troponin I (High Sensitivity): 9 ng/L (ref <18)

## 2019-10-18 NOTE — ED Triage Notes (Signed)
From [facility] [place] for poss stroke.  She had stood in [place] and her legs buckled and had headache.   Stroke screen negative.  Cant hold arms up due to bad shoulders,.  Hr 60, Lbbb.  99%RA, 146/58, fsbs 104, 20 g lac.  Some confusion, but daughter said worse than usual.

## 2019-10-18 NOTE — ED Triage Notes (Signed)
Pt to triage via wheelchair.  Pt reports feeling weak today at [facility] in [place].  States my legs buckled under me.  Pt did not fall.  Pt has dull headache.  No chest pain or sob.  Pt alert  Speech clear.  Daughter with pt.

## 2019-10-19 ENCOUNTER — Telehealth: Payer: Self-pay | Admitting: Emergency Medicine

## 2019-10-19 NOTE — Telephone Encounter (Signed)
Called patient due to lwot to inquire about condition and follow up plans. Left message.   

## 2020-05-20 ENCOUNTER — Emergency Department
Admission: EM | Admit: 2020-05-20 | Discharge: 2020-05-20 | Disposition: A | Discharge status: Home / Self Care | Payer: Medicare Other | Attending: Emergency Medicine | Admitting: Emergency Medicine

## 2020-05-20 ENCOUNTER — Emergency Department: Payer: Medicare Other

## 2020-05-20 ENCOUNTER — Other Ambulatory Visit: Payer: Self-pay

## 2020-05-20 DIAGNOSIS — Z79899 Other long term (current) drug therapy: Secondary | ICD-10-CM | POA: Diagnosis not present

## 2020-05-20 DIAGNOSIS — M25512 Pain in left shoulder: Secondary | ICD-10-CM

## 2020-05-20 DIAGNOSIS — I10 Essential (primary) hypertension: Secondary | ICD-10-CM | POA: Diagnosis not present

## 2020-05-20 DIAGNOSIS — M25552 Pain in left hip: Secondary | ICD-10-CM

## 2020-05-20 DIAGNOSIS — Z853 Personal history of malignant neoplasm of breast: Secondary | ICD-10-CM | POA: Diagnosis not present

## 2020-05-20 DIAGNOSIS — Z87891 Personal history of nicotine dependence: Secondary | ICD-10-CM | POA: Insufficient documentation

## 2020-05-20 DIAGNOSIS — Z96612 Presence of left artificial shoulder joint: Secondary | ICD-10-CM | POA: Insufficient documentation

## 2020-05-20 DIAGNOSIS — Z7982 Long term (current) use of aspirin: Secondary | ICD-10-CM | POA: Diagnosis not present

## 2020-05-20 DIAGNOSIS — Z96653 Presence of artificial knee joint, bilateral: Secondary | ICD-10-CM | POA: Insufficient documentation

## 2020-05-20 DIAGNOSIS — Z91040 Latex allergy status: Secondary | ICD-10-CM | POA: Insufficient documentation

## 2020-05-20 LAB — COMPREHENSIVE METABOLIC PANEL
ALT: 19 U/L (ref 0–44)
AST: 24 U/L (ref 15–41)
Albumin: 3.8 g/dL (ref 3.5–5.0)
Alkaline Phosphatase: 56 U/L (ref 38–126)
Anion gap: 8 (ref 5–15)
BUN: 17 mg/dL (ref 8–23)
CO2: 29 mmol/L (ref 22–32)
Calcium: 8.9 mg/dL (ref 8.9–10.3)
Chloride: 98 mmol/L (ref 98–111)
Creatinine, Ser: 1.22 mg/dL — ABNORMAL HIGH (ref 0.44–1.00)
GFR, Estimated: 43 mL/min — ABNORMAL LOW (ref >60)
Glucose, Bld: 106 mg/dL — ABNORMAL HIGH (ref 70–99)
Potassium: 3.7 mmol/L (ref 3.5–5.1)
Sodium: 135 mmol/L (ref 135–145)
Total Bilirubin: 1.1 mg/dL (ref 0.3–1.2)
Total Protein: 6.7 g/dL (ref 6.5–8.1)

## 2020-05-20 LAB — CBC
HCT: 36.4 % (ref 36.0–46.0)
Hemoglobin: 12.2 g/dL (ref 12.0–15.0)
MCH: 32.3 pg (ref 26.0–34.0)
MCHC: 33.5 g/dL (ref 30.0–36.0)
MCV: 96.3 fL (ref 80.0–100.0)
Platelets: 202 10*3/uL (ref 150–400)
RBC: 3.78 MIL/uL — ABNORMAL LOW (ref 3.87–5.11)
RDW: 13 % (ref 11.5–15.5)
WBC: 5.9 10*3/uL (ref 4.0–10.5)
nRBC: 0 % (ref 0.0–0.2)

## 2020-05-20 MED ORDER — TRAMADOL HCL 50 MG PO TABS: 50.0000 mg | ORAL_TABLET | Freq: Four times a day (QID) | ORAL | 0 refills | Status: DC | PRN

## 2020-05-20 NOTE — ED Notes (Signed)
Resting in room, no distress noted.

## 2020-05-20 NOTE — ED Notes (Signed)
Pt resting in room after medication by EMS.  Reports heard popping noise yesterday and fell against shower and had left shoulder pain.  Medication by EMS.  Patient able to move all extremities. + Distal pulses and sensation.  Alert and oriented.  O2 sats in upper 80s after EMS med admin, patient placed on O2 by  and O2 sats increased.

## 2020-05-20 NOTE — ED Notes (Signed)
Report to [place], Therapist, sports

## 2020-05-20 NOTE — ED Notes (Signed)
Patient refused walker, states she has two at home.

## 2020-05-20 NOTE — ED Provider Notes (Signed)
[facility] Emergency Department Provider Note   ____________________________________________    I have reviewed the triage vital signs and the nursing notes.   HISTORY  Chief Complaint Hip Pain and Shoulder Pain     HPI Cynthia Raymond is a 87 y.o. female who presents with complaints of left hip pain and left shoulder pain.  Patient reports she was standing, felt a popping sensation in her left hip and fell towards a wall where she injured her left shoulder.  She reports she is able to move her arm but complains of left shoulder pain which is moderate.  She also complains of left hip pain especially with movement of the left leg.  No head injury.  Not on blood thinners.  No back pain or neck pain  Past Medical History:  Diagnosis Date  . Cancer ([place])    breast  . Graves disease   . Hypertension   . Osteoporosis   . Thyroid disease     Patient Active Problem List   Diagnosis Date Noted  . Aortic atherosclerosis ([place]) 02/12/2013  . Abdominal pain, chronic, right lower quadrant 02/12/2013  . Weakness of limb 02/12/2013    Past Surgical History:  Procedure Laterality Date  . BACK SURGERY     X3  . BREAST LUMPECTOMY    . CATARACT EXTRACTION W/PHACO Left 02/18/2018   Procedure: CATARACT EXTRACTION PHACO AND INTRAOCULAR LENS PLACEMENT ([place]) LEFT;  Surgeon: Leandrew Koyanagi, MD;  Location: [place];  Service: Ophthalmology;  Laterality: Left;  . CATARACT EXTRACTION W/PHACO Right 03/17/2018   Procedure: CATARACT EXTRACTION PHACO AND INTRAOCULAR LENS PLACEMENT ([place])  RIGHT;  Surgeon: Leandrew Koyanagi, MD;  Location: [place];  Service: Ophthalmology;  Laterality: Right;  . ELBOW SURGERY Right   . HAND SURGERY Right   . JOINT REPLACEMENT Bilateral    Both knees, left shoulder  . ROTATOR CUFF REPAIR Bilateral     Prior to Admission medications   Medication Sig Start Date End Date Taking? Authorizing Provider   traMADol (ULTRAM) 50 MG tablet Take 1 tablet (50 mg total) by mouth every 6 (six) hours as needed. 05/20/20 05/20/21 Yes Lavonia Drafts, MD  aspirin 81 MG tablet Take 81 mg by mouth daily.    [provider]  Cholecalciferol (VITAMIN D PO) Take 2 capsules by mouth daily.    [provider]  Cyanocobalamin (VITAMIN B 12 PO) Take 1 tablet by mouth daily.    [provider]  levothyroxine (SYNTHROID, LEVOTHROID) 150 MCG tablet Take 150 mcg by mouth daily before breakfast.    [provider]  ramipril (ALTACE) 10 MG capsule Take 10 mg by mouth daily.    [provider]  traMADol (ULTRAM) 50 MG tablet Take 1 tablet (50 mg total) by mouth every 6 (six) hours as needed. 06/28/19   Caryn Section Linden Dolin, PA-C  venlafaxine XR (EFFEXOR-XR) 150 MG 24 hr capsule Take 150 mg by mouth daily with breakfast.    [provider]  vitamin C (ASCORBIC ACID) 500 MG tablet Take 500 mg by mouth daily.    [provider]     Allergies Penicillins, Amitriptyline, Latex, Morphine and related, and Sulfa antibiotics  Family History  Problem Relation Age of Onset  . Cancer Mother   . Heart attack Father   . Cancer Sister   . Heart disease Sister   . Hyperlipidemia Sister   . Heart attack Sister   . Cancer Brother   .  Cancer Daughter     Social History Social History   Tobacco Use  . Smoking status: Former Smoker    Quit date: 01/08/1987    Years since quitting: 33.3  . Smokeless tobacco: Never Used  . Tobacco comment: Quit 1989  Substance Use Topics  . Alcohol use: No  . Drug use: No    Review of Systems  Constitutional: No fever/chills Eyes: No visual changes.  ENT: No sore throat. Cardiovascular: Denies chest pain. Respiratory: Denies shortness of breath. Gastrointestinal: No abdominal pain.  No nausea, no vomiting.   Genitourinary: Negative for dysuria. Musculoskeletal: As Skin: Negative for rash. Neurological: Negative for headaches or  weakness   ____________________________________________   PHYSICAL EXAM:  VITAL SIGNS: ED Triage Vitals  Enc Vitals Group     BP 05/20/20 0909 (!) 96/48     Pulse Rate 05/20/20 0909 94     Resp 05/20/20 0909 15     Temp 05/20/20 0909 98.1 F (36.7 C)     Temp Source 05/20/20 0909 Oral     SpO2 05/20/20 0909 94 %     Weight 05/20/20 0907 95.3 kg (210 lb)     Height 05/20/20 0907 1.524 m (5')     Head Circumference --      Peak Flow --      Pain Score 05/20/20 0907 0     Pain Loc --      Pain Edu? --      Excl. in [place]? --     Constitutional: Alert and oriented Eyes: Conjunctivae are normal.  Head: Atraumatic. Nose: No congestion/rhinnorhea. Mouth/Throat: Mucous membranes are moist.   Neck:  Painless ROM Cardiovascular: Normal rate, regular rhythm.  Good peripheral circulation. Respiratory: Normal respiratory effort.  No retractions.  Gastrointestinal: Soft and nontender. No distention.  No CVA tenderness.  Musculoskeletal: Left hip, mild tenderness laterally, no bony abnormalities palpated, no swelling or bruising.  Warm and well-perfused distally, no shortening or rotation.  Left shoulder exam is overall reassuring, mild tenderness no significant swelling, range of motion is reassuring Neurologic:  Normal speech and language. No gross focal neurologic deficits are appreciated.  Skin:  Skin is warm, dry and intact.  Psychiatric: Mood and affect are normal. Speech and behavior are normal.  ____________________________________________   LABS (all labs ordered are listed, but only abnormal results are displayed)  Labs Reviewed  CBC - Abnormal; Notable for the following components:      Result Value   RBC 3.78 (*)    All other components within normal limits  COMPREHENSIVE METABOLIC PANEL - Abnormal; Notable for the following components:   Glucose, Bld 106 (*)    Creatinine, Ser 1.22 (*)    GFR, Estimated 43 (*)    All other components within normal limits    ____________________________________________  EKG  ED ECG REPORT I, Lavonia Drafts, the attending physician, personally viewed and interpreted this ECG.  Date: 05/20/2020  Rhythm: normal sinus rhythm QRS Axis: normal Intervals: Left bundle branch block ST/T Wave abnormalities: normal Narrative Interpretation: no evidence of acute ischemia  ____________________________________________  RADIOLOGY  Hip x-ray, shoulder ____________________________________________   PROCEDURES  Procedure(s) performed: No  Procedures   Critical Care performed: No ____________________________________________   INITIAL IMPRESSION / ASSESSMENT AND PLAN / ED COURSE  Pertinent labs & imaging results that were available during my care of the patient were reviewed by me and considered in my medical decision making (see chart for details).  Patient well-appearing overall, she reports her pain  is well controlled after receiving IV fentanyl via EMS.  Left hip pain with movement, question possible fracture.  Shoulder pain appears to be more of a contusion, pending x-rays of both.  X-rays negative for acute bony injury.  Patient's pain is controlled.  We will ambulate her with a walker, appropriate for discharge with outpatient follow-up with orthopedics    ____________________________________________   FINAL CLINICAL IMPRESSION(S) / ED DIAGNOSES  Final diagnoses:  Left hip pain  Acute pain of left shoulder        Note:  This document was prepared using Dragon voice recognition software and may include unintentional dictation errors.   Lavonia Drafts, MD 05/20/20 1059

## 2020-05-20 NOTE — ED Triage Notes (Signed)
Pt comes ems from home with left shoulder pain and hip pain. Reports a popping noise in her left hip yesterday and fell against her shower when it happened and now left shoulder hurts. Right hip has been replaced. Pt had 106mcg of fentenyl and 4mg  zofran. Pressure was originally 683 systolic but after fentenyl dropped to 80 systolic with EMS. Pt remains alert and reports pain is better. Pt able to move left leg but reports pain.

## 2020-05-25 ENCOUNTER — Encounter: Payer: Self-pay | Admitting: Student in an Organized Health Care Education/Training Program

## 2020-05-25 ENCOUNTER — Telehealth: Payer: Self-pay | Admitting: Student in an Organized Health Care Education/Training Program

## 2020-05-25 ENCOUNTER — Ambulatory Visit
Payer: Medicare Other | Attending: Student in an Organized Health Care Education/Training Program | Admitting: Student in an Organized Health Care Education/Training Program

## 2020-05-25 ENCOUNTER — Other Ambulatory Visit: Payer: Self-pay

## 2020-05-25 VITALS — BP 119/61 | HR 76 | Temp 97.7°F | Resp 16 | Ht 60.0 in | Wt 210.0 lb

## 2020-05-25 DIAGNOSIS — M5136 Other intervertebral disc degeneration, lumbar region: Secondary | ICD-10-CM | POA: Diagnosis present

## 2020-05-25 DIAGNOSIS — N1831 Chronic kidney disease, stage 3a: Secondary | ICD-10-CM | POA: Diagnosis present

## 2020-05-25 DIAGNOSIS — M25511 Pain in right shoulder: Secondary | ICD-10-CM

## 2020-05-25 DIAGNOSIS — G894 Chronic pain syndrome: Secondary | ICD-10-CM | POA: Insufficient documentation

## 2020-05-25 DIAGNOSIS — Z981 Arthrodesis status: Secondary | ICD-10-CM | POA: Diagnosis present

## 2020-05-25 DIAGNOSIS — G8929 Other chronic pain: Secondary | ICD-10-CM

## 2020-05-25 DIAGNOSIS — Z96641 Presence of right artificial hip joint: Secondary | ICD-10-CM | POA: Insufficient documentation

## 2020-05-25 DIAGNOSIS — M19012 Primary osteoarthritis, left shoulder: Secondary | ICD-10-CM | POA: Insufficient documentation

## 2020-05-25 DIAGNOSIS — M48062 Spinal stenosis, lumbar region with neurogenic claudication: Secondary | ICD-10-CM | POA: Insufficient documentation

## 2020-05-25 DIAGNOSIS — Z9889 Other specified postprocedural states: Secondary | ICD-10-CM | POA: Diagnosis present

## 2020-05-25 DIAGNOSIS — M51369 Other intervertebral disc degeneration, lumbar region without mention of lumbar back pain or lower extremity pain: Secondary | ICD-10-CM | POA: Insufficient documentation

## 2020-05-25 MED ORDER — PREGABALIN 25 MG PO CAPS: ORAL_CAPSULE | ORAL | 0 refills | Status: DC

## 2020-05-25 NOTE — Patient Instructions (Signed)
GENERAL RISKS AND COMPLICATIONS  What are the risk, side effects and possible complications? Generally speaking, most procedures are safe.  However, with any procedure there are risks, side effects, and the possibility of complications.  The risks and complications are dependent upon the sites that are lesioned, or the type of nerve block to be performed.  The closer the procedure is to the spine, the more serious the risks are.  Great care is taken when placing the radio frequency needles, block needles or lesioning probes, but sometimes complications can occur. 1. Infection: Any time there is an injection through the skin, there is a risk of infection.  This is why sterile conditions are used for these blocks.  There are four possible types of infection. 1. Localized skin infection. 2. Central Nervous System Infection-This can be in the form of Meningitis, which can be deadly. 3. Epidural Infections-This can be in the form of an epidural abscess, which can cause pressure inside of the spine, causing compression of the spinal cord with subsequent paralysis. This would require an emergency surgery to decompress, and there are no guarantees that the patient would recover from the paralysis. 4. Discitis-This is an infection of the intervertebral discs.  It occurs in about 1% of discography procedures.  It is difficult to treat and it may lead to surgery.        2. Pain: the needles have to go through skin and soft tissues, will cause soreness.       3. Damage to internal structures:  The nerves to be lesioned may be near blood vessels or    other nerves which can be potentially damaged.       4. Bleeding: Bleeding is more common if the patient is taking blood thinners such as  aspirin, Coumadin, Ticiid, Plavix, etc., or if he/she have some genetic predisposition  such as hemophilia. Bleeding into the spinal canal can cause compression of the spinal  cord with subsequent paralysis.  This would require an  emergency surgery to  decompress and there are no guarantees that the patient would recover from the  paralysis.       5. Pneumothorax:  Puncturing of a lung is a possibility, every time a needle is introduced in  the area of the chest or upper back.  Pneumothorax refers to free air around the  collapsed lung(s), inside of the thoracic cavity (chest cavity).  Another two possible  complications related to a similar event would include: Hemothorax and Chylothorax.   These are variations of the Pneumothorax, where instead of air around the collapsed  lung(s), you may have blood or chyle, respectively.       6. Spinal headaches: They may occur with any procedures in the area of the spine.       7. Persistent CSF (Cerebro-Spinal Fluid) leakage: This is a rare problem, but may occur  with prolonged intrathecal or epidural catheters either due to the formation of a fistulous  track or a dural tear.       8. Nerve damage: By working so close to the spinal cord, there is always a possibility of  nerve damage, which could be as serious as a permanent spinal cord injury with  paralysis.       9. Death:  Although rare, severe deadly allergic reactions known as "Anaphylactic  reaction" can occur to any of the medications used.      10. Worsening of the symptoms:  We can always make thing worse.    What are the chances of something like this happening? Chances of any of this occuring are extremely low.  By statistics, you have more of a chance of getting killed in a motor vehicle accident: while driving to the hospital than any of the above occurring .  Nevertheless, you should be aware that they are possibilities.  In general, it is similar to taking a shower.  Everybody knows that you can slip, hit your head and get killed.  Does that mean that you should not shower again?  Nevertheless always keep in mind that statistics do not mean anything if you happen to be on the wrong side of them.  Even if a procedure has a 1  (one) in a 1,000,000 (million) chance of going wrong, it you happen to be that one..Also, keep in mind that by statistics, you have more of a chance of having something go wrong when taking medications.  Who should not have this procedure? If you are on a blood thinning medication (e.g. Coumadin, Plavix, see list of "Blood Thinners"), or if you have an active infection going on, you should not have the procedure.  If you are taking any blood thinners, please inform your physician.  How should I prepare for this procedure?  Do not eat or drink anything at least six hours prior to the procedure.  Bring a driver with you .  It cannot be a taxi.  Come accompanied by an adult that can drive you back, and that is strong enough to help you if your legs get weak or numb from the local anesthetic.  Take all of your medicines the morning of the procedure with just enough water to swallow them.  If you have diabetes, make sure that you are scheduled to have your procedure done first thing in the morning, whenever possible.  If you have diabetes, take only half of your insulin dose and notify our nurse that you have done so as soon as you arrive at the clinic.  If you are diabetic, but only take blood sugar pills (oral hypoglycemic), then do not take them on the morning of your procedure.  You may take them after you have had the procedure.  Do not take aspirin or any aspirin-containing medications, at least eleven (11) days prior to the procedure.  They may prolong bleeding.  Wear loose fitting clothing that may be easy to take off and that you would not mind if it got stained with Betadine or blood.  Do not wear any jewelry or perfume  Remove any nail coloring.  It will interfere with some of our monitoring equipment.  NOTE: Remember that this is not meant to be interpreted as a complete list of all possible complications.  Unforeseen problems may occur.  BLOOD THINNERS The following drugs  contain aspirin or other products, which can cause increased bleeding during surgery and should not be taken for 2 weeks prior to and 1 week after surgery.  If you should need take something for relief of minor pain, you may take acetaminophen which is found in Tylenol,m Datril, Anacin-3 and Panadol. It is not blood thinner. The products listed below are.  Do not take any of the products listed below in addition to any listed on your instruction sheet.  A.P.C or A.P.C with Codeine Codeine Phosphate Capsules #3 Ibuprofen Ridaura  ABC compound Congesprin Imuran rimadil  Advil Cope Indocin Robaxisal  Alka-Seltzer Effervescent Pain Reliever and Antacid Coricidin or Coricidin-D  Indomethacin Rufen    Alka-Seltzer plus Cold Medicine Cosprin Ketoprofen S-A-C Tablets  Anacin Analgesic Tablets or Capsules Coumadin Korlgesic Salflex  Anacin Extra Strength Analgesic tablets or capsules CP-2 Tablets Lanoril Salicylate  Anaprox Cuprimine Capsules Levenox Salocol  Anexsia-D Dalteparin Magan Salsalate  Anodynos Darvon compound Magnesium Salicylate Sine-off  Ansaid Dasin Capsules Magsal Sodium Salicylate  Anturane Depen Capsules Marnal Soma  APF Arthritis pain formula Dewitt's Pills Measurin Stanback  Argesic Dia-Gesic Meclofenamic Sulfinpyrazone  Arthritis Bayer Timed Release Aspirin Diclofenac Meclomen Sulindac  Arthritis pain formula Anacin Dicumarol Medipren Supac  Analgesic (Safety coated) Arthralgen Diffunasal Mefanamic Suprofen  Arthritis Strength Bufferin Dihydrocodeine Mepro Compound Suprol  Arthropan liquid Dopirydamole Methcarbomol with Aspirin Synalgos  ASA tablets/Enseals Disalcid Micrainin Tagament  Ascriptin Doan's Midol Talwin  Ascriptin A/D Dolene Mobidin Tanderil  Ascriptin Extra Strength Dolobid Moblgesic Ticlid  Ascriptin with Codeine Doloprin or Doloprin with Codeine Momentum Tolectin  Asperbuf Duoprin Mono-gesic Trendar  Aspergum Duradyne Motrin or Motrin IB Triminicin  Aspirin  plain, buffered or enteric coated Durasal Myochrisine Trigesic  Aspirin Suppositories Easprin Nalfon Trillsate  Aspirin with Codeine Ecotrin Regular or Extra Strength Naprosyn Uracel  Atromid-S Efficin Naproxen Ursinus  Auranofin Capsules Elmiron Neocylate Vanquish  Axotal Emagrin Norgesic Verin  Azathioprine Empirin or Empirin with Codeine Normiflo Vitamin E  Azolid Emprazil Nuprin Voltaren  Bayer Aspirin plain, buffered or children's or timed BC Tablets or powders Encaprin Orgaran Warfarin Sodium  Buff-a-Comp Enoxaparin Orudis Zorpin  Buff-a-Comp with Codeine Equegesic Os-Cal-Gesic   Buffaprin Excedrin plain, buffered or Extra Strength Oxalid   Bufferin Arthritis Strength Feldene Oxphenbutazone   Bufferin plain or Extra Strength Feldene Capsules Oxycodone with Aspirin   Bufferin with Codeine Fenoprofen Fenoprofen Pabalate or Pabalate-SF   Buffets II Flogesic Panagesic   Buffinol plain or Extra Strength Florinal or Florinal with Codeine Panwarfarin   Buf-Tabs Flurbiprofen Penicillamine   Butalbital Compound Four-way cold tablets Penicillin   Butazolidin Fragmin Pepto-Bismol   Carbenicillin Geminisyn Percodan   Carna Arthritis Reliever Geopen Persantine   Carprofen Gold's salt Persistin   Chloramphenicol Goody's Phenylbutazone   Chloromycetin Haltrain Piroxlcam   Clmetidine heparin Plaquenil   Cllnoril Hyco-pap Ponstel   Clofibrate Hydroxy chloroquine Propoxyphen         Before stopping any of these medications, be sure to consult the physician who ordered them.  Some, such as Coumadin (Warfarin) are ordered to prevent or treat serious conditions such as "deep thrombosis", "pumonary embolisms", and other heart problems.  The amount of time that you may need off of the medication may also vary with the medication and the reason for which you were taking it.  If you are taking any of these medications, please make sure you notify your pain physician before you undergo any  procedures.         Moderate Conscious Sedation, Adult Sedation is the use of medicines to promote relaxation and to relieve discomfort and anxiety. Moderate conscious sedation is a type of sedation. Under moderate conscious sedation, you are less alert than normal, but you are still able to respond to instructions, touch, or both. Moderate conscious sedation is used during short medical and dental procedures. It is milder than deep sedation, which is a type of sedation under which you cannot be easily woken up. It is also milder than general anesthesia, which is the use of medicines to make you unconscious. Moderate conscious sedation allows you to return to your regular activities sooner. Tell a health care provider about:  Any allergies you have.  All medicines you  are taking, including vitamins, herbs, eye drops, creams, and over-the-counter medicines.  Any use of steroids. This includes steroids taken by mouth or as a cream.  Any problems you or family members have had with sedatives and anesthetic medicines.  Any blood disorders you have.  Any surgeries you have had.  Any medical conditions you have, such as sleep apnea.  Whether you are pregnant or may be pregnant.  Any use of cigarettes, alcohol, marijuana, or drugs. What are the risks? Generally, this is a safe procedure. However, problems may occur, including:  Getting too much medicine (oversedation).  Nausea.  Allergic reaction to medicines.  Trouble breathing. If this happens, a breathing tube may be used. It will be removed when you are awake and breathing on your own.  Heart trouble.  Lung trouble.  Confusion that gets better with time (emergence delirium). What happens before the procedure? Staying hydrated Follow instructions from your health care provider about hydration, which may include:  Up to 2 hours before the procedure - you may continue to drink clear liquids, such as water, clear fruit  juice, black coffee, and plain tea. Eating and drinking restrictions Follow instructions from your health care provider about eating and drinking, which may include:  8 hours before the procedure - stop eating heavy meals or foods, such as meat, fried foods, or fatty foods.  6 hours before the procedure - stop eating light meals or foods, such as toast or cereal.  6 hours before the procedure - stop drinking milk or drinks that contain milk.  2 hours before the procedure - stop drinking clear liquids. Medicines Ask your health care provider about:  Changing or stopping your regular medicines. This is especially important if you are taking diabetes medicines or blood thinners.  Taking medicines such as aspirin and ibuprofen. These medicines can thin your blood. Do not take these medicines unless your health care provider tells you to take them.  Taking over-the-counter medicines, vitamins, herbs, and supplements. Tests and exams  You will have a physical exam.  You may have blood tests done to show how well: ? Your kidneys and liver work. ? Your blood clots. General instructions  Plan to have a responsible adult take you home from the hospital or clinic.  If you will be going home right after the procedure, plan to have a responsible adult care for you for the time you are told. This is important. What happens during the procedure?  You will be given the sedative. The sedative may be given: ? As a pill that you will swallow. It can also be inserted into the rectum. ? As a spray through the nose. ? As an injection into the muscle. ? As an injection into the vein through an IV.  You may be given oxygen as needed.  Your breathing, heart rate, and blood pressure will be monitored during the procedure.  The medical or dental procedure will be done. The procedure may vary among health care providers and hospitals.   What happens after the procedure?  Your blood pressure, heart  rate, breathing rate, and blood oxygen level will be monitored until you leave the hospital or clinic.  You will get fluids through your IV if needed.  Do not drive or operate machinery until your health care provider says that it is safe. Summary  Sedation is the use of medicines to promote relaxation and to relieve discomfort and anxiety. Moderate conscious sedation is a type of sedation that  is used during short medical and dental procedures.  Tell the health care provider about any medical conditions that you have and about all the medicines that you are taking.  You will be given the sedative as a pill, a spray through the nose, an injection into the muscle, or an injection into the vein through an IV. Vital signs are monitored during the sedation.  Moderate conscious sedation allows you to return to your regular activities sooner. This information is not intended to replace advice given to you by your health care provider. Make sure you discuss any questions you have with your health care provider. Document Revised: 04/23/2019 Document Reviewed: 11/19/2018 Elsevier Patient Education  2021 [employer]. Selective Nerve Root Block Patient Information  Description: Specific nerve roots exit the spinal canal and these nerves can be compressed and inflamed by a bulging disc and bone spurs.  By injecting steroids on the nerve root, we can potentially decrease the inflammation surrounding these nerves, which often leads to decreased pain.  Also, by injecting local anesthesia on the nerve root, this can provide [place] helpful information to give to your referring doctor if it decreases your pain.  Selective nerve root blocks can be done along the spine from the neck to the low back depending on the location of your pain.   After numbing the skin with local anesthesia, a small needle is passed to the nerve root and the position of the needle is verified using x-ray pictures.  After the needle is in  correct position, we then deposit the medication.  You may experience a pressure sensation while this is being done.  The entire block usually lasts less than 15 minutes.  Conditions that may be treated with selective nerve root blocks:  Low back and leg pain  Spinal stenosis  Diagnostic block prior to potential surgery  Neck and arm pain  Post laminectomy syndrome  Preparation for the injection:  1. Do not eat any solid food or dairy products within 8 hours of your appointment. 2. You may drink clear liquids up to 3 hours before an appointment.  Clear liquids include water, black coffee, juice or soda.  No milk or cream please. 3. You may take your regular medications, including pain medications, with a sip of water before your appointment.  Diabetics should hold regular insulin (if taken separately) and take 1/2 normal NPH dose the morning of the procedure.  Carry some sugar containing items with you to your appointment. 4. A driver must accompany you and be prepared to drive you home after your procedure. 5. Bring all your current medications with you. 6. An IV may be inserted and sedation may be given at the discretion of the physician. 7. A blood pressure cuff, EKG, and other monitors will often be applied during the procedure.  Some patients may need to have extra oxygen administered for a short period. 8. You will be asked to provide medical information, including allergies, prior to the procedure.  We must know immediately if you are taking blood  Thinners (like Coumadin) or if you are allergic to IV iodine contrast (dye).  Possible side-effects: All are usually temporary  Bleeding from needle site  Light headedness  Numbness and tingling  Decreased blood pressure  Weakness in arms/legs  Pressure sensation in back/neck  Pain at injection site (several days)  Possible complications: All are extremely rare  Infection  Nerve injury  Spinal headache (a headache  wore with upright position)  Call  if you experience:  Fever/chills associated with headache or increased back/neck pain  Headache worsened by an upright position  New onset weakness or numbness of an extremity below the injection site  Hives or difficulty breathing (go to the emergency room)  Inflammation or drainage at the injection site(s)  Severe back/neck pain greater than usual  New symptoms which are concerning to you  Please note:  Although the local anesthetic injected can often make your back or neck feel good for several hours after the injection the pain will likely return.  It takes 3-5 days for steroids to work on the nerve root. You may not notice any pain relief for at least one week.  If effective, we will often do a series of 3 injections spaced 3-6 weeks apart to maximally decrease your pain.    If you have any questions, please call [phone] [facility]

## 2020-05-25 NOTE — Telephone Encounter (Signed)
Med needs prior auth before the pharmacy can fill

## 2020-05-25 NOTE — Progress Notes (Signed)
Safety precautions to be maintained throughout the outpatient stay will include: orient to surroundings, keep bed in low position, maintain call bell within reach at all times, provide assistance with transfer out of bed and ambulation.  

## 2020-05-25 NOTE — Progress Notes (Signed)
Patient: Cynthia Raymond  Service Category: E/M  Provider: Gillis Santa, MD  DOB: [birth year]  DOS: 05/25/2020  Referring Provider: Caroline More, DPM  MRN: [id number]  Setting: Ambulatory outpatient  PCP: Dion Body, MD  Type: New Patient  Specialty: Interventional Pain Management    Location: Office  Delivery: Face-to-face     Primary Reason(s) for Visit: Encounter for initial evaluation of one or more chronic problems (new to examiner) potentially causing chronic pain, and posing a threat to normal musculoskeletal function. (Level of risk: High) CC: Hip Pain (Left s/p fall last Saturday and was taken to the ED), Heel Spurs (Left heel, knot on the back of heel ), Shoulder Pain (Left ), and Neck Pain ([place] )  HPI  Cynthia Raymond is a 87 y.o. year old, female patient, who comes for the first time to our practice referred by Caroline More, DPM for our initial evaluation of her chronic pain. She has Aortic atherosclerosis ([place]); Abdominal pain, chronic, right lower quadrant; Weakness of limb; Status post right hip replacement; Stage 3a chronic kidney disease ([place]); Spinal stenosis, lumbar region, with neurogenic claudication; S/P carpal tunnel release; Radiculopathy of cervical region; Morbid obesity with BMI of 40.0-44.9, adult ([place]); Chronic right shoulder pain; Carpal tunnel syndrome of left wrist; Chronic pain syndrome; Primary osteoarthritis of left shoulder; and Lumbar degenerative disc disease on their problem list. Today she comes in for evaluation of her Hip Pain (Left s/p fall last Saturday and was taken to the ED), Heel Spurs (Left heel, knot on the back of heel ), Shoulder Pain (Left ), and Neck Pain ([place] )  Pain Assessment: Location: Left Hip (see visit info for additional pain sites.) Radiating: shoulder pain into left arm and hand Onset: More than a month ago Duration: Chronic pain Quality: Discomfort,Constant,Numbness,Pins and needles,Tingling,Throbbing,Burning Severity: 8  /10 (subjective, self-reported pain score)  Effect on ADL: patient is having great difficulty walking.  states she hurts all over.  the most current pain is coming from a fall on last saturday Timing: Constant Modifying factors: pain medications BP: 119/61  HR: 76  Onset and Duration: Gradual Cause of pain: Unknown Severity: Getting worse Timing: Not influenced by the time of the day Aggravating Factors: Motion, Twisting and Walking Alleviating Factors: Resting Associated Problems: Inability to control bladder (urine), Sadness and Tingling Quality of Pain: Aching, Burning and Feeling of weight Previous Examinations or Tests: Biopsy, CT scan, Ct-Myelogram, Endoscopy, MRI scan and X-rays Previous Treatments: Epidural steroid injections, Physical Therapy and Trigger point injections  Cynthia Raymond is a pleasant 87 year old female who presents with multiple pain generators including her left shoulder, left arm, neck, left hip, low back as well as left heel pain.  Of note, patient has a history of lumbar spine surgery, right elbow surgery, right hand surgery,, right hip replacement.  She has diffuse pain all over her body.  Her most pronounced pain is in her left shoulder.  She has left shoulder osteoarthritis and rotator cuff dysfunction.  She has done physical therapy in the past with limited response.  She has tried gabapentin in the past which resulted in GI side effects.  She is currently on Effexor 150 mg and also takes tramadol 50 mg twice daily as needed as needed.  She lives by herself.  She is endorsing pain in her left hip as well as bruising of her left hip as she states that her left hip joint "popped in and out of socket" over the last weekend.  She had  to go to the emergency department for this for x-rays were unremarkable.  She denies any bowel or bladder dysfunction.  Meds   Current Outpatient Medications:  .  acetaminophen (TYLENOL) 500 MG tablet, Take 1,000 mg by mouth every 8  (eight) hours as needed., Disp: , Rfl:  .  aspirin 81 MG tablet, Take 81 mg by mouth daily., Disp: , Rfl:  .  atorvastatin (LIPITOR) 80 MG tablet, Take 1 tablet by mouth daily., Disp: , Rfl:  .  Cholecalciferol (VITAMIN D PO), Take 2 capsules by mouth daily., Disp: , Rfl:  .  Cyanocobalamin (VITAMIN B 12 PO), Take 1 tablet by mouth daily., Disp: , Rfl:  .  Cyanocobalamin (VITAMIN B 12 PO), Take 1 tablet by mouth daily., Disp: , Rfl:  .  levothyroxine (SYNTHROID, LEVOTHROID) 150 MCG tablet, Take 150 mcg by mouth daily before breakfast., Disp: , Rfl:  .  Melatonin 3 MG CAPS, Take 2 tablets by mouth at bedtime., Disp: , Rfl:  .  pregabalin (LYRICA) 25 MG capsule, Take 1 capsule (25 mg total) by mouth at bedtime for 30 days, THEN 2 capsules (50 mg total) at bedtime as needed., Disp: 90 capsule, Rfl: 0 .  ramipril (ALTACE) 10 MG capsule, Take 10 mg by mouth daily., Disp: , Rfl:  .  traMADol (ULTRAM) 50 MG tablet, Take 1 tablet (50 mg total) by mouth every 6 (six) hours as needed., Disp: 15 tablet, Rfl: 0 .  venlafaxine XR (EFFEXOR-XR) 150 MG 24 hr capsule, Take 150 mg by mouth daily with breakfast., Disp: , Rfl:  .  vitamin C (ASCORBIC ACID) 500 MG tablet, Take 500 mg by mouth daily., Disp: , Rfl:   Imaging Review  Cervical Imaging: Cervical MR wo contrast: Results for orders placed during the hospital encounter of 09/20/11  MR Cervical Spine Wo Contrast  Narrative *RADIOLOGY REPORT*  Clinical Data: Left neck pain radiating to the left upper extremity.  MRI CERVICAL SPINE WITHOUT CONTRAST  Technique:  Multiplanar and multiecho pulse sequences of the cervical spine, to include the craniocervical junction and cervicothoracic junction, were obtained according to standard protocol without intravenous contrast.  Comparison: None.  Findings: The foramen magnum is widely patent.  There is ordinary mild degenerative change at the C1-2 articulation but no encroachment upon the neural  structures.  C2-3:  Facet degeneration right worse than left.  No disc pathology.  Canal and foramina sufficiently patent.  C3-4:  Facet degeneration left worse than right.  Mild bulging of the disc.  Foraminal narrowing on the left that could effect the C4 nerve root.  C4-5:  Mild facet degeneration.  Bulging of the disc.  Sufficient patency of the central canal.  Mild foraminal narrowing bilaterally, not grossly compressive.  C5-6:  Spondylosis with endplate osteophytes and bulging of the disc.  Narrowing of the ventral subarachnoid space but no compression of the cord.  Mild to moderate foraminal narrowing bilaterally.  C6-7:  Chronic disc degeneration with loss of height.  Wide patency of the central canal.  Mild foraminal narrowing on the left because of osteophytic encroachment.  C7-T1:  Facet degeneration with 2 mm of anterolisthesis.  Mild bulging of the disc.  No compressive narrowing of the canal or foramina.  T1-2:  Facet degeneration with 1 mm of anterolisthesis.  No significant stenosis.  No abnormal cord signal.  IMPRESSION: Chronic degenerative changes throughout the neck as outlined above. There is no central canal stenosis.  The spinal cord appears normal.  Foraminal narrowing  because of osteophytic encroachment is most notable on the left at C3-4, on the left at C6-7 and bilaterally at C5-6.   Original Report Authenticated By: Jules Schick, M.D.    Narrative CLINICAL DATA:  Fall in shower, pain  EXAM: CT HEAD WITHOUT CONTRAST  CT CERVICAL SPINE WITHOUT CONTRAST  TECHNIQUE: Multidetector CT imaging of the head and cervical spine was performed following the standard protocol without intravenous contrast. Multiplanar CT image reconstructions of the cervical spine were also generated.  COMPARISON:  12/01/2018  FINDINGS: CT HEAD FINDINGS  Brain: No evidence of acute infarction, hemorrhage, hydrocephalus, extra-axial collection or mass  lesion/mass effect. Periventricular and deep white matter hypodensity.  Vascular: No hyperdense vessel or unexpected calcification.  Skull: Normal. Negative for fracture or focal lesion.  Sinuses/Orbits: No acute finding.  Other: None.  CT CERVICAL SPINE FINDINGS  Alignment: Normal.  Skull base and vertebrae: No acute fracture. No primary bone lesion or focal pathologic process.  Soft tissues and spinal canal: No prevertebral fluid or swelling. No visible canal hematoma.  Disc levels: Moderate disc space height loss and osteophytosis of the lower cervical spine with anterior bridging osteophytes from C4 through C7.  Upper chest: Negative.  Other: None.  IMPRESSION: 1. No acute intracranial pathology. Small-vessel white matter disease. 2. No fracture or static subluxation of the cervical spine. 3. Moderate disc space height loss and osteophytosis of the lower cervical spine.   Electronically Signed By: Eddie Candle M.D. On: 06/28/2019 12:54  DG Shoulder Right  Narrative CLINICAL DATA:  Status post fall down stairs today with a right shoulder injury. Pain. Initial encounter.  EXAM: RIGHT SHOULDER - 2+ VIEW  COMPARISON:  Plain films right shoulder 10/26/2014.  FINDINGS: Reverse shoulder arthroplasty is again seen. The device is located. There is no acute bony or joint abnormality. Imaged right lung and ribs are unremarkable.  IMPRESSION: No acute abnormality.   Electronically Signed By: Inge Rise M.D. On: 02/14/2015 15:01   Narrative CLINICAL DATA:  Shoulder injury.  EXAM: LEFT SHOULDER - 2+ VIEW  COMPARISON:  02/14/2015  FINDINGS: Three suture anchors noted within the humeral head. There is no acute fracture or dislocation identified. The distal clavicle appears foreshortened which likely reflects postsurgical change. Marked degenerative changes noted at the glenohumeral joint. Narrowing of the acromiohumeral interval is noted which  may reflect underlying rotator cuff injury. Aortic atherosclerotic calcifications identified.  IMPRESSION: 1. No acute fracture or dislocation. 2. Marked glenohumeral joint osteoarthritis. 3. Narrowing of the acromiohumeral interval which may reflect underlying rotator cuff injury.   Electronically Signed By: Kerby Moors M.D. On: 05/20/2020 10:24   MR THORACIC SPINE WO CONTRAST  Narrative CLINICAL DATA:  87 y/o F; mid and lower back pain. History of fall in 2017. History of left breast cancer.  EXAM: MRI THORACIC SPINE WITHOUT CONTRAST  TECHNIQUE: Multiplanar, multisequence MR imaging of the thoracic spine was performed. No intravenous contrast was administered.  COMPARISON:  None.  FINDINGS: Alignment:  Physiologic.  Vertebrae: No fracture, evidence of discitis, or bone lesion. Mild degenerative endplate edema at the Z2-2 opposing endplates and the T8 inferior endplate. L5-6 interbody fusion.  Cord:  Normal signal and morphology.  Paraspinal and other soft tissues: Negative.  Disc levels:  T1-2: No significant disk displacement, foraminal stenosis, or canal stenosis.  T2-3: No significant disk displacement, foraminal stenosis, or canal stenosis.  T3-4: No significant disk displacement, foraminal stenosis, or canal stenosis.  T4-5: No significant disk displacement, foraminal stenosis, or canal  stenosis.  T5-6: Interbody fusion and posterior ossific ridging with ventral thecal sac effacement and minimal anterior cord flattening. No significant foraminal or canal stenosis.  T6-7: No significant disk displacement, foraminal stenosis, or canal stenosis.  T7-8: No significant disk displacement, foraminal stenosis, or canal stenosis.  T8-9: No significant disk displacement, foraminal stenosis, or canal stenosis.  T9-10: No significant disk displacement, foraminal stenosis, or canal stenosis.  T10-11: Posterior marginal osteophytes and mild facet  hypertrophy with mild foraminal and canal stenosis.  T11-12: Small disc bulge and mild facet hypertrophy. No significant foraminal stenosis. Mild canal stenosis.  T12-L1: Disc bulge eccentric to the left and mild facet hypertrophy. Mild left foraminal stenosis. Mild canal stenosis.  IMPRESSION: 1. No acute osseous abnormality or abnormal cord signal. 2. Lumbar degenerative changes greatest at the T10-11 through T12-L1 levels where there is mild canal stenosis and multilevel mild foraminal stenosis.   Electronically Signed By: Kristine Garbe M.D. On: 09/07/2016 01:54  MR Lumbar Spine Wo Contrast  Narrative *RADIOLOGY REPORT*  Clinical Data: Low back pain and bilateral leg pain.  Numbness in the left foot.  The left leg weakness.  Previous lumbar surgeries.  MRI LUMBAR SPINE WITHOUT CONTRAST  Technique:  Multiplanar and multiecho pulse sequences of the lumbar spine were obtained without intravenous contrast.  Comparison: Lumbar radiographs dated 07/23/2005  Findings: Scan extends from T11-12 through S3.  Tip of the conus is at L2-3 and appears normal.  There is a tiny bulge of the mid abdominal aorta to a maximal diameter of 2.5 cm.  Paraspinal soft tissues are otherwise normal.  T11-12 through L1-2:  There is disc space narrowing with small broad-based disc bulges without neural impingement or significant spinal stenosis.  L2-3:  There is a small broad-based disc bulge with accompanying osteophyte formation.  Hypertrophied ligamenta flava and facet joints compress the lateral recesses and the sides of the thecal sac causing mild spinal stenosis.  There is also right foraminal stenosis.  L3-4:  Solid interbody fusion with posterior decompression with no residual impingement.  Slight scarring around the right side of the thecal sac and the right L3 nerve root sleeve.  L4-5:  The disc is normal.  There is marked hypertrophy of the ligamenta flava and facet  joints with posterior decompression surgery.  Moderate bilateral foraminal stenosis and compression of the right and left sides of the thecal sac.  No focal neural impingement.  L5-S1:  There is a central annular tear and a tiny central subligamentous disc protrusion with bulging of the remainder of the disc without focal neural impingement.  Moderate hypertrophy of the ligamenta flava and facet joints.  There has been posterior decompression.  IMPRESSION:  1.  No acute abnormalities of the lumbar spine. 2.  Narrowing of the transverse dimension of the spinal canal L2-3, L4-5 and L5 S1 due to hypertrophy of the facet joints. 3.  Posterior decompression surgery from L2-3 through L5-S1 with no residual impingement. 3.  Solid fusion at L3-4 with slight scarring around the right side of the thecal sac and the right L3 nerve root sleeve.  Original Report Authenticated By: Larey Seat, M.D.   Narrative CLINICAL DATA:  Lumbago. Severe low back and right leg pain with right leg weakness. Three prior lumbar surgeries.  BUN and creatinine were obtained on site at [facility] at [address]Results: BUN 21 mg/dL, Creatinine 1.0 mg/dL.  EXAM: MRI LUMBAR SPINE WITHOUT AND WITH CONTRAST  TECHNIQUE: Multiplanar and multiecho pulse sequences  of the lumbar spine were obtained without and with intravenous contrast.  CONTRAST:  96m MULTIHANCE GADOBENATE DIMEGLUMINE 529 MG/ML IV SOLN  COMPARISON:  09/20/2011  FINDINGS: There is grade 1 retrolisthesis of L2 on L3 and grade 1 anterolisthesis of L4 on L5 and of L5 on S1, unchanged. Schmorl's nodes involving the inferior endplates at TH88 TF02 and L1 are unchanged with mild adjacent degenerative marrow changes. The conus medullaris is normal in signal and terminates at L2. Sequelae of prior L3-4 posterior lumbar interbody fusion are again identified with solid appearing vertebral body fusion. There is no  abnormal enhancement.  T11-12: Only imaged sagittally.  Mild disc bulge without stenosis.  T12-L1: Mild disc bulge without spinal canal or neural foraminal stenosis, unchanged.  L1-2: Disc bulge mildly narrows the lateral recesses without spinal canal or neural foraminal stenosis, unchanged.  L2-3: Retrolisthesis, disc bulge, and facet hypertrophy result in moderate bilateral lateral recess narrowing and mild to moderate bilateral neural foraminal narrowing, unchanged. Prior posterior decompression.  L3-4: Prior posterior decompression and posterior and interbody fusion. Spinal canal and neural foramina are patent.  L4-5: Mild disc bulge and moderate facet hypertrophy result in moderate bilateral lateral recess narrowing without neural foraminal stenosis. Prior posterior decompression.  L5-S1: Mild disc bulge and moderate facet arthrosis mildly narrow the lateral recesses without spinal canal or neural foraminal stenosis.  IMPRESSION: Prior decompression and fusion at L3-4 without stenosis. Unchanged appearance of mild to moderate degenerative disc disease and facet arthrosis elsewhere in the lumbar spine with mild to moderate lateral recess and neural foraminal narrowing as above, greatest at L2-3.   Electronically Signed By: ALogan BoresOn: 01/23/2013 08:49 DG Lumbar Spine 2-3 Views  Narrative Clinical Data:  Status post PLIF  Comparison: Operative films from 05/22/2005  LUMBAR SPINE - 2 VIEW:  Findings:  Patient is status post discectomy at L3-L4 with interbody fusion. Bilateral pedicle screws with fusion bars are seen from L3-L4. No evidence for hardware complications. Alignment of the bony vertebral axis is anatomic.  Prominent calcification seen in the wall of the distal aorta which measures up to 2.3 cm in AP diameter.  IMPRESSION:  Status post L3-L4 fusion without evidence for complicating features.  Provider: MNolene Bernheim Lumbar DG (Complete)  4+V: Results for orders placed during the hospital encounter of 02/14/15  DG Lumbar Spine Complete  Narrative CLINICAL DATA:  Pain following fall  EXAM: LUMBAR SPINE - COMPLETE 4+ VIEW  COMPARISON:  January 12, 2013  FINDINGS: Frontal, lateral, spot lumbosacral lateral, and bilateral oblique views were obtained. The there are 5 non-rib-bearing lumbar type vertebral bodies. Patient is status post pedicle screw placement at L3 and L4 with the pedicle screws positioned within the respective vertebral bodies, stable. There is no fracture or spondylolisthesis. There has been previous fusion at L3-4, stable. There is moderate disc space narrowing at all visualized lower thoracic levels as well as at L1-2 and L2-3. Moderate narrowing is noted at L5-S1 as well. There is facet osteoarthritic change at all levels bilaterally.  There is extensive atherosclerotic calcification aorta. There is mild widening of the distal aorta, stable.  IMPRESSION: Postoperative change at L3 and L4. Multilevel arthropathy, essentially stable. No fracture or spondylolisthesis. Atherosclerotic calcification in the aorta with slight widening distally which appears stable. Mild aneurysmal dilatation in this area is questioned; this finding may warrant nonemergent ultrasound of the aorta to further assess.   Electronically Signed By: WLowella GripIII M.D. On: 02/14/2015 15:01 DG Hip Unilat  W or Wo Pelvis 2-3 Views Right  Narrative CLINICAL DATA:  Right hip pain secondary to a fall in the shower.  EXAM: DG HIP (WITH OR WITHOUT PELVIS) 2-3V RIGHT  COMPARISON:  Radiographs dated 02/14/2015  FINDINGS: Interval right total hip arthroplasty. No fracture or dislocation. No evidence of loosening of the components of the total knee prosthesis.  IMPRESSION: No acute abnormality.   Electronically Signed By: Lorriane Shire M.D. On: 06/28/2019 12:14  Hip-L DG 2-3 views: Results for orders placed  during the hospital encounter of 05/20/20  DG Hip Unilat With Pelvis 2-3 Views Left  Narrative CLINICAL DATA:  Left hip popping, fell  EXAM: DG HIP (WITH OR WITHOUT PELVIS) 2-3V LEFT  COMPARISON:  CT 06/28/2019, and previous  FINDINGS: Left hip unremarkable. Lower lumbar fixation hardware partially visualized. Degenerative disc disease L4-5. Right hip arthroplasty components partially visualized. No fracture or dislocation.  IMPRESSION: Negative left hip.   Electronically Signed By: Lucrezia Europe M.D. On: 05/20/2020 10:22  Narrative CLINICAL DATA:  Pain secondary to a fall in the shower.  EXAM: RIGHT KNEE - COMPLETE 4+ VIEW  COMPARISON:  10/26/2014  FINDINGS: No evidence of fracture, dislocation, or joint effusion. Status post total knee replacement. The components appear in good position with no evidence of loosening. Chronic dystrophic calcifications in the soft tissues of the anterior aspect of the knee, stable.  IMPRESSION: No acute abnormalities.   Electronically Signed By: Lorriane Shire M.D. On: 06/28/2019 12:16   Complexity Note: Imaging results reviewed. Results shared with Cynthia Raymond, using Layman's terms.                         ROS  Cardiovascular: High blood pressure, Heart catheterization and Needs antibiotics prior to dental procedures Pulmonary or Respiratory: Shortness of breath and Snoring  Neurological: Incontinence:  Urinary Psychological-Psychiatric: Anxiousness Gastrointestinal: No reported gastrointestinal signs or symptoms such as vomiting or evacuating blood, reflux, heartburn, alternating episodes of diarrhea and constipation, inflamed or scarred liver, or pancreas or irrregular and/or infrequent bowel movements Genitourinary: Difficulty emptying the bladder or controlling the flow of urine (Neurogenic bladder) Hematological: No reported hematological signs or symptoms such as prolonged bleeding, low or poor functioning platelets,  bruising or bleeding easily, hereditary bleeding problems, low energy levels due to low hemoglobin or being anemic Endocrine: thyroid disease Rheumatologic: Joint aches and or swelling due to excess weight (Osteoarthritis) Musculoskeletal: Negative for myasthenia gravis, muscular dystrophy, multiple sclerosis or malignant hyperthermia Work History: Retired  Allergies  Cynthia Raymond is allergic to penicillins, amitriptyline, latex, morphine and related, and sulfa antibiotics.  Laboratory Chemistry Profile   Renal Lab Results  Component Value Date   BUN 17 05/20/2020   CREATININE 1.22 (H) 05/20/2020   GFRAA 57 (L) 06/28/2019   GFRNONAA 43 (L) 05/20/2020   PROTEINUR Negative 01/14/2014     Electrolytes Lab Results  Component Value Date   NA 135 05/20/2020   K 3.7 05/20/2020   CL 98 05/20/2020   CALCIUM 8.9 05/20/2020   MG 1.9 01/15/2014     Hepatic Lab Results  Component Value Date   AST 24 05/20/2020   ALT 19 05/20/2020   ALBUMIN 3.8 05/20/2020   ALKPHOS 56 05/20/2020   LIPASE 118 01/14/2014     ID Lab Results  Component Value Date   [place] NEGATIVE 06/28/2019     Bone No results found for: [place], [id number], [id number], [id number], [place], 25OHVITD2, 25OHVITD3, TESTOFREE, TESTOSTERONE   Endocrine Lab  Results  Component Value Date   GLUCOSE 106 (H) 05/20/2020   GLUCOSEU Negative 01/14/2014     Neuropathy No results found for: VITAMINB12, FOLATE, HGBA1C, HIV   CNS No results found for: COLORCSF, APPEARCSF, RBCCOUNTCSF, WBCCSF, POLYSCSF, LYMPHSCSF, EOSCSF, PROTEINCSF, GLUCCSF, JCVIRUS, CSFOLI, IGGCSF, LABACHR, ACETBL, LABACHR, ACETBL   Inflammation (CRP: Acute  ESR: Chronic) No results found for: CRP, ESRSEDRATE, LATICACIDVEN   Rheumatology No results found for: RF, ANA, LABURIC, URICUR, LYMEIGGIGMAB, LYMEABIGMQN, HLAB27   Coagulation Lab Results  Component Value Date   INR 1.2 01/15/2014   LABPROT 14.7 01/15/2014   APTT 30.0 01/15/2014   PLT  202 05/20/2020     Cardiovascular Lab Results  Component Value Date   BNP 159 03/16/2012   TROPONINI <0.03 05/14/2016   HGB 12.2 05/20/2020   HCT 36.4 05/20/2020     Screening Lab Results  Component Value Date   SARSCOV2NAA NEGATIVE 06/28/2019     Cancer No results found for: CEA, CA125, LABCA2   Allergens No results found for: ALMOND, APPLE, ASPARAGUS, AVOCADO, BANANA, BARLEY, BASIL, BAYLEAF, GREENBEAN, LIMABEAN, WHITEBEAN, BEEFIGE, REDBEET, BLUEBERRY, BROCCOLI, CABBAGE, MELON, CARROT, CASEIN, CASHEWNUT, CAULIFLOWER, CELERY     Note: Lab results reviewed.  Cynthia Raymond  Drug: Cynthia Raymond  reports no history of drug use. Alcohol:  reports no history of alcohol use. Tobacco:  reports that she quit smoking about 33 years ago. She has never used smokeless tobacco. Medical:  has a past medical history of Cancer ([place]), Graves disease, Hypertension, Osteoporosis, and Thyroid disease. Family: family history includes Cancer in her brother, daughter, mother, and sister; Heart attack in her father and sister; Heart disease in her sister; Hyperlipidemia in her sister.  Past Surgical History:  Procedure Laterality Date  . BACK SURGERY     X3  . BREAST LUMPECTOMY    . CATARACT EXTRACTION W/PHACO Left 02/18/2018   Procedure: CATARACT EXTRACTION PHACO AND INTRAOCULAR LENS PLACEMENT ([place]) LEFT;  Surgeon: Leandrew Koyanagi, MD;  Location: [place];  Service: Ophthalmology;  Laterality: Left;  . CATARACT EXTRACTION W/PHACO Right 03/17/2018   Procedure: CATARACT EXTRACTION PHACO AND INTRAOCULAR LENS PLACEMENT ([place])  RIGHT;  Surgeon: Leandrew Koyanagi, MD;  Location: [place];  Service: Ophthalmology;  Laterality: Right;  . ELBOW SURGERY Right   . HAND SURGERY Right   . JOINT REPLACEMENT Bilateral    Both knees, left shoulder  . ROTATOR CUFF REPAIR Bilateral    Active Ambulatory Problems    Diagnosis Date Noted  . Aortic atherosclerosis ([place]) 02/12/2013  . Abdominal  pain, chronic, right lower quadrant 02/12/2013  . Weakness of limb 02/12/2013  . Status post right hip replacement 05/23/2016  . Stage 3a chronic kidney disease ([place]) 05/09/2016  . Spinal stenosis, lumbar region, with neurogenic claudication 09/07/2010  . S/P carpal tunnel release 05/28/2017  . Radiculopathy of cervical region 03/05/2017  . Morbid obesity with BMI of 40.0-44.9, adult ([place]) 11/20/2017  . Chronic right shoulder pain 06/18/2013  . Carpal tunnel syndrome of left wrist 06/03/2014  . Chronic pain syndrome 05/25/2020  . Primary osteoarthritis of left shoulder 05/25/2020  . Lumbar degenerative disc disease 05/25/2020   Resolved Ambulatory Problems    Diagnosis Date Noted  . No Resolved Ambulatory Problems   Past Medical History:  Diagnosis Date  . Cancer ([place])   . Graves disease   . Hypertension   . Osteoporosis   . Thyroid disease    Constitutional Exam  General appearance: Well nourished, well developed, and well hydrated. In  no apparent acute distress Vitals:   05/25/20 1439  BP: 119/61  Pulse: 76  Resp: 16  Temp: 97.7 F (36.5 C)  TempSrc: Temporal  SpO2: 99%  Weight: 210 lb (95.3 kg)  Height: 5' (1.524 m)   BMI Assessment: Estimated body mass index is 41.01 kg/m as calculated from the following:   Height as of this encounter: 5' (1.524 m).   Weight as of this encounter: 210 lb (95.3 kg).  BMI interpretation table: BMI level Category Range association with higher incidence of chronic pain  <18 kg/m2 Underweight   18.5-24.9 kg/m2 Ideal body weight   25-29.9 kg/m2 Overweight Increased incidence by 20%  30-34.9 kg/m2 Obese (Class I) Increased incidence by 68%  35-39.9 kg/m2 Severe obesity (Class II) Increased incidence by 136%  >40 kg/m2 Extreme obesity (Class III) Increased incidence by 254%   Patient's current BMI Ideal Body weight  Body mass index is 41.01 kg/m. Ideal body weight: 45.5 kg (100 lb 4.9 oz) Adjusted ideal body weight: 65.4 kg (144 lb  3 oz)   BMI Readings from Last 4 Encounters:  05/25/20 41.01 kg/m  05/20/20 41.01 kg/m  10/18/19 41.40 kg/m  06/28/19 36.31 kg/m   Wt Readings from Last 4 Encounters:  05/25/20 210 lb (95.3 kg)  05/20/20 210 lb (95.3 kg)  10/18/19 212 lb (96.2 kg)  06/28/19 205 lb (93 kg)    Psych/Mental status: Alert, oriented x 3 (person, place, & time)       Eyes: PERLA Respiratory: No evidence of acute respiratory distress Cervical Spine Area Exam  Skin & Axial Inspection: No masses, redness, edema, swelling, or associated skin lesions Alignment: Symmetrical Functional ROM: Unrestricted ROM      Stability: No instability detected Muscle Tone/Strength: Functionally intact. No obvious neuro-muscular anomalies detected. Sensory (Neurological): Musculoskeletal pain pattern Palpation: No palpable anomalies             Upper Extremity (UE) Exam    Side: Right upper extremity  Side: Left upper extremity  Skin & Extremity Inspection: Skin color, temperature, and hair growth are WNL. No peripheral edema or cyanosis. No masses, redness, swelling, asymmetry, or associated skin lesions. No contractures.  Skin & Extremity Inspection: Skin color, temperature, and hair growth are WNL. No peripheral edema or cyanosis. No masses, redness, swelling, asymmetry, or associated skin lesions. No contractures.  Functional ROM: Unrestricted ROM          Functional ROM: Pain restricted ROM for shoulder and elbow  Muscle Tone/Strength: Functionally intact. No obvious neuro-muscular anomalies detected.  Muscle Tone/Strength: Functionally intact. No obvious neuro-muscular anomalies detected.  Sensory (Neurological): Unimpaired          Sensory (Neurological): Neurogenic pain pattern          Palpation: No palpable anomalies              Palpation: No palpable anomalies              Provocative Test(s):  Phalen's test: deferred Tinel's test: deferred Apley's scratch test (touch opposite shoulder):  Action 1 (Across  chest): deferred Action 2 (Overhead): deferred Action 3 (LB reach): deferred   Provocative Test(s):  Phalen's test: deferred Tinel's test: deferred Apley's scratch test (touch opposite shoulder):  Action 1 (Across chest): Decreased ROM Action 2 (Overhead): Decreased ROM Action 3 (LB reach): Decreased ROM    Lumbar Spine Area Exam  Skin & Axial Inspection: Well healed scar from previous spine surgery detected Alignment: Symmetrical Functional ROM: Pain restricted ROM  Stability: No instability detected Muscle Tone/Strength: Functionally intact. No obvious neuro-muscular anomalies detected. Sensory (Neurological): Dermatomal pain pattern  Gait & Posture Assessment  Ambulation: Limited Gait: Antalgic Posture: Difficulty standing up straight, due to pain  Lower Extremity Exam    Side: Right lower extremity  Side: Left lower extremity  Stability: No instability observed          Stability: No instability observed          Skin & Extremity Inspection: Skin color, temperature, and hair growth are WNL. No peripheral edema or cyanosis. No masses, redness, swelling, asymmetry, or associated skin lesions. No contractures.  Skin & Extremity Inspection: Skin color, temperature, and hair growth are WNL. No peripheral edema or cyanosis. No masses, redness, swelling, asymmetry, or associated skin lesions. No contractures.  Functional ROM: Unrestricted ROM                  Functional ROM: Pain restricted ROM for hip and knee joints          Muscle Tone/Strength: Functionally intact. No obvious neuro-muscular anomalies detected.  Muscle Tone/Strength: Functionally intact. No obvious neuro-muscular anomalies detected.  Sensory (Neurological): Unimpaired        Sensory (Neurological): Arthropathic arthralgia        DTR: Patellar: 0: absent Achilles: deferred today Plantar: deferred today  DTR: Patellar: 0: absent Achilles: deferred today Plantar: deferred today  Palpation: No palpable  anomalies  Palpation: No palpable anomalies   Assessment  Primary Diagnosis & Pertinent Problem List: The primary encounter diagnosis was Chronic pain syndrome. Diagnoses of Primary osteoarthritis of left shoulder, Spinal stenosis, lumbar region, with neurogenic claudication, Lumbar degenerative disc disease, Chronic right shoulder pain, S/P carpal tunnel release, Stage 3a chronic kidney disease ([place]), Status post right hip replacement, and History of lumbar fusion (L3/4 PLIF) were also pertinent to this visit.  Visit Diagnosis (New problems to examiner): 1. Chronic pain syndrome   2. Primary osteoarthritis of left shoulder   3. Spinal stenosis, lumbar region, with neurogenic claudication   4. Lumbar degenerative disc disease   5. Chronic right shoulder pain   6. S/P carpal tunnel release   7. Stage 3a chronic kidney disease ([place])   8. Status post right hip replacement   9. History of lumbar fusion (L3/4 PLIF)    Plan of Care (Initial workup plan)     Lab Orders     Compliance Drug Analysis, Ur  Procedure Orders     SUPRASCAPULAR NERVE BLOCK Pharmacotherapy (current): Medications ordered:  Meds ordered this encounter  Medications  . pregabalin (LYRICA) 25 MG capsule    Sig: Take 1 capsule (25 mg total) by mouth at bedtime for 30 days, THEN 2 capsules (50 mg total) at bedtime as needed.    Dispense:  90 capsule    Refill:  0    Fill one day early if pharmacy is closed on scheduled refill date. May substitute for generic if available.   Medications administered during this visit: Cynthia Raymond had no medications administered during this visit.   Pharmacological management options:  Opioid Analgesics: The patient was informed that there is no guarantee that she would be a candidate for opioid analgesics. The decision will be made following CDC guidelines. This decision will be based on the results of diagnostic studies, as well as Cynthia Raymond's risk profile.   Membrane  stabilizer: Failed gabapentin due to side effects.  Trial of Lyrica as above.  Already on Effexor  Muscle relaxant: Avoid  given side effects of dizziness and sedation  NSAID: To be determined at a later time  Other analgesic(s): To be determined at a later time   Interventional management options: Cynthia Raymond was informed that there is no guarantee that she would be a candidate for interventional therapies. The decision will be based on the results of diagnostic studies, as well as Cynthia Raymond's risk profile.  Procedure(s) under consideration:  Left suprascapular nerve block, possible pulsed radiofrequency ablation Lumbar facet medial branch nerve blocks Sprint peripheral nerve stimulation of lumbar medial branch SI joint intervention   Provider-requested follow-up: Return in about 3 weeks (around 06/15/2020) for left suprascapular nerve block without sedation.  No future appointments.  Note by: Gillis Santa, MD Date: 05/25/2020; Time: 3:31 PM

## 2020-05-30 ENCOUNTER — Other Ambulatory Visit: Payer: Self-pay | Admitting: Student

## 2020-05-30 DIAGNOSIS — M25552 Pain in left hip: Secondary | ICD-10-CM

## 2020-06-01 ENCOUNTER — Other Ambulatory Visit: Payer: Medicare Other

## 2020-06-03 LAB — COMPLIANCE DRUG ANALYSIS, UR

## 2020-06-09 ENCOUNTER — Ambulatory Visit
Admission: RE | Admit: 2020-06-09 | Discharge: 2020-06-09 | Disposition: A | Discharge status: Home / Self Care | Payer: Medicare Other | Source: Ambulatory Visit | Attending: Student | Admitting: Student

## 2020-06-09 ENCOUNTER — Other Ambulatory Visit: Payer: Self-pay

## 2020-06-09 DIAGNOSIS — M25552 Pain in left hip: Secondary | ICD-10-CM

## 2020-06-12 ENCOUNTER — Telehealth: Payer: Self-pay

## 2020-06-12 NOTE — Telephone Encounter (Signed)
Attempted to return patient's call. Message left.

## 2020-06-12 NOTE — Telephone Encounter (Signed)
Pt. Would like a nurse call to better explain her procedure. She is having aleft suprascapular nerve block without sedation. I pushed it back a week because she stated that she was "afraid" and was not sure she would be able to go through with it. She would just like someone to call and explain to her what is going to happen

## 2020-06-13 NOTE — Telephone Encounter (Signed)
Questions answered.

## 2020-06-14 ENCOUNTER — Ambulatory Visit: Payer: Medicare Other | Admitting: Student in an Organized Health Care Education/Training Program

## 2020-06-21 ENCOUNTER — Ambulatory Visit: Payer: Medicare Other | Admitting: Student in an Organized Health Care Education/Training Program

## 2020-06-28 ENCOUNTER — Other Ambulatory Visit: Payer: Self-pay | Admitting: Student

## 2020-06-28 DIAGNOSIS — G3184 Mild cognitive impairment, so stated: Secondary | ICD-10-CM

## 2020-07-05 ENCOUNTER — Other Ambulatory Visit: Payer: Self-pay | Admitting: Family Medicine

## 2020-07-05 DIAGNOSIS — R1319 Other dysphagia: Secondary | ICD-10-CM

## 2020-07-05 DIAGNOSIS — R1013 Epigastric pain: Secondary | ICD-10-CM

## 2020-07-05 DIAGNOSIS — R633 Feeding difficulties, unspecified: Secondary | ICD-10-CM

## 2020-07-11 ENCOUNTER — Ambulatory Visit: Payer: Medicare Other

## 2020-10-17 ENCOUNTER — Other Ambulatory Visit: Payer: Self-pay | Admitting: Gastroenterology

## 2020-10-17 ENCOUNTER — Other Ambulatory Visit (HOSPITAL_BASED_OUTPATIENT_CLINIC_OR_DEPARTMENT_OTHER): Payer: Self-pay | Admitting: Gastroenterology

## 2020-10-17 DIAGNOSIS — R1013 Epigastric pain: Secondary | ICD-10-CM

## 2020-10-17 DIAGNOSIS — R1314 Dysphagia, pharyngoesophageal phase: Secondary | ICD-10-CM

## 2020-10-17 DIAGNOSIS — K219 Gastro-esophageal reflux disease without esophagitis: Secondary | ICD-10-CM

## 2020-10-25 ENCOUNTER — Other Ambulatory Visit: Payer: Self-pay

## 2020-10-25 ENCOUNTER — Ambulatory Visit
Admission: RE | Admit: 2020-10-25 | Discharge: 2020-10-25 | Disposition: A | Discharge status: Home / Self Care | Payer: Medicare Other | Source: Ambulatory Visit | Attending: Gastroenterology | Admitting: Gastroenterology

## 2020-10-25 DIAGNOSIS — K219 Gastro-esophageal reflux disease without esophagitis: Secondary | ICD-10-CM | POA: Insufficient documentation

## 2020-10-25 DIAGNOSIS — R1314 Dysphagia, pharyngoesophageal phase: Secondary | ICD-10-CM | POA: Diagnosis not present

## 2020-10-25 DIAGNOSIS — R1013 Epigastric pain: Secondary | ICD-10-CM | POA: Diagnosis present

## 2021-06-25 ENCOUNTER — Emergency Department: Payer: Medicare Other

## 2021-06-25 ENCOUNTER — Encounter: Payer: Self-pay | Admitting: Emergency Medicine

## 2021-06-25 ENCOUNTER — Emergency Department
Admission: EM | Admit: 2021-06-25 | Discharge: 2021-06-25 | Disposition: A | Discharge status: Home / Self Care | Payer: Medicare Other | Attending: Emergency Medicine | Admitting: Emergency Medicine

## 2021-06-25 ENCOUNTER — Other Ambulatory Visit: Payer: Self-pay

## 2021-06-25 DIAGNOSIS — I2510 Atherosclerotic heart disease of native coronary artery without angina pectoris: Secondary | ICD-10-CM | POA: Insufficient documentation

## 2021-06-25 DIAGNOSIS — R0789 Other chest pain: Secondary | ICD-10-CM | POA: Diagnosis not present

## 2021-06-25 DIAGNOSIS — R110 Nausea: Secondary | ICD-10-CM | POA: Diagnosis not present

## 2021-06-25 DIAGNOSIS — R0602 Shortness of breath: Secondary | ICD-10-CM | POA: Insufficient documentation

## 2021-06-25 DIAGNOSIS — N189 Chronic kidney disease, unspecified: Secondary | ICD-10-CM | POA: Insufficient documentation

## 2021-06-25 DIAGNOSIS — H538 Other visual disturbances: Secondary | ICD-10-CM | POA: Insufficient documentation

## 2021-06-25 DIAGNOSIS — J441 Chronic obstructive pulmonary disease with (acute) exacerbation: Secondary | ICD-10-CM | POA: Insufficient documentation

## 2021-06-25 DIAGNOSIS — I4891 Unspecified atrial fibrillation: Secondary | ICD-10-CM | POA: Diagnosis not present

## 2021-06-25 DIAGNOSIS — R519 Headache, unspecified: Secondary | ICD-10-CM | POA: Diagnosis not present

## 2021-06-25 DIAGNOSIS — R079 Chest pain, unspecified: Secondary | ICD-10-CM

## 2021-06-25 DIAGNOSIS — I129 Hypertensive chronic kidney disease with stage 1 through stage 4 chronic kidney disease, or unspecified chronic kidney disease: Secondary | ICD-10-CM | POA: Diagnosis not present

## 2021-06-25 LAB — CBC
HCT: 40.7 % (ref 36.0–46.0)
Hemoglobin: 12.9 g/dL (ref 12.0–15.0)
MCH: 31.5 pg (ref 26.0–34.0)
MCHC: 31.7 g/dL (ref 30.0–36.0)
MCV: 99.3 fL (ref 80.0–100.0)
Platelets: 218 10*3/uL (ref 150–400)
RBC: 4.1 MIL/uL (ref 3.87–5.11)
RDW: 13.7 % (ref 11.5–15.5)
WBC: 5.2 10*3/uL (ref 4.0–10.5)
nRBC: 0 % (ref 0.0–0.2)

## 2021-06-25 LAB — BASIC METABOLIC PANEL
Anion gap: 7 (ref 5–15)
BUN: 14 mg/dL (ref 8–23)
CO2: 27 mmol/L (ref 22–32)
Calcium: 9.2 mg/dL (ref 8.9–10.3)
Chloride: 108 mmol/L (ref 98–111)
Creatinine, Ser: 0.8 mg/dL (ref 0.44–1.00)
GFR, Estimated: >60 mL/min (ref >60)
Glucose, Bld: 117 mg/dL — ABNORMAL HIGH (ref 70–99)
Potassium: 3.8 mmol/L (ref 3.5–5.1)
Sodium: 142 mmol/L (ref 135–145)

## 2021-06-25 LAB — BRAIN NATRIURETIC PEPTIDE: B Natriuretic Peptide: 45.7 pg/mL (ref 0.0–100.0)

## 2021-06-25 LAB — TROPONIN I (HIGH SENSITIVITY)
Troponin I (High Sensitivity): 6 ng/L (ref <18)
Troponin I (High Sensitivity): 7 ng/L (ref <18)

## 2021-06-25 LAB — SEDIMENTATION RATE: Sed Rate: 17 mm/hr (ref 0–30)

## 2021-06-25 LAB — D-DIMER, QUANTITATIVE: D-Dimer, Quant: 0.89 ug/mL-FEU — ABNORMAL HIGH (ref 0.00–0.50)

## 2021-06-25 MED ORDER — ACETAMINOPHEN 500 MG PO TABS
1000.0000 mg | ORAL_TABLET | Freq: Once | ORAL | Status: AC
  Administered 2021-06-25: 1000 mg via ORAL
  Filled 2021-06-25: qty 2

## 2021-06-25 MED ORDER — IOHEXOL 350 MG/ML SOLN
75.0000 mL | Freq: Once | INTRAVENOUS | Status: AC | PRN
  Administered 2021-06-25: 75 mL via INTRAVENOUS

## 2021-06-25 NOTE — ED Provider Triage Note (Signed)
Emergency Medicine Provider Triage Evaluation Note  Cynthia Raymond , a 88 y.o. female  was evaluated in triage.  Pt complains of center anterior chest pain. Chest felt heavy and radiation down left upper extremity, short of breath yesterday and this am.  No CP now.  Currently seeing cardiology at [facility] for work up. No prior MI known.    Review of Systems  Positive:  short of breath, left arm heaviness.   Negative:  no N, V  No blood thinners   Physical Exam  There were no vitals taken for this visit. Gen:   Awake, no distress   Resp:  Normal effort  MSK:   Moves extremities without difficulty  Other:    Medical Decision Making  Medically screening exam initiated at 1:44 PM.  Appropriate orders placed.  Cynthia Raymond was informed that the remainder of the evaluation will be completed by another provider, this initial triage assessment does not replace that evaluation, and the importance of remaining in the ED until their evaluation is complete.     Johnn Hai, PA-C 06/25/21 1355

## 2021-06-25 NOTE — ED Notes (Signed)
Dc ppw provided to pt. rx info, follow up and questiosns addressed. pt declines vs at time of dc and provides verbal consent for dc. pt ambulatory off unit.

## 2021-06-25 NOTE — ED Provider Notes (Signed)
[facility] Provider Note    Event Date/Time   First MD Initiated Contact with Patient 06/25/21 1600     (approximate)   History   Chest Pain   HPI  Cynthia Raymond is a 88 y.o. female with a past medical history of CAD, COPD, CKD, A-fib, arthritis, HTN, HDL, osteoporosis, thyroid disease, tremor, vertigo who presents accompanied by her daughter for evaluation of several symptoms that seem to start altogether fairly suddenly earlier today approximately 30 minutes to 40 minutes prior to arrival.  Patient states she developed some chest tightness rating down her left arm associate with shortness of breath.  She states he also developed a headache and had some nausea.  She describes her headache as right-sided and feels that her right eye became little blurry.  She states that she is feeling much better now and does not currently have chest pain or shortness of breath.  She does feel that if she were to walk a couple feet that she would become much more short of breath than usual as she is typically able to ambulate without much difficulty around her house using a walker only resting occasionally due to some dizziness.  She denies any fevers, cough, abdominal pain, diarrhea, urinary symptoms, rash, earache, sore throat or any focal extremity weakness numbness or tingling.  She is a remote smoker but no recent tobacco abuse.  No recent falls or injuries.      Physical Exam  Triage Vital Signs: ED Triage Vitals [06/25/21 1346]  Enc Vitals Group     BP 139/82     Pulse Rate 72     Resp 18     Temp 97.6 F (36.4 C)     Temp Source Oral     SpO2 93 %     Weight 210 lb 1.6 oz (95.3 kg)     Height 5' (1.524 m)     Head Circumference      Peak Flow      Pain Score 0     Pain Loc      Pain Edu?      Excl. in [place]?     Most recent vital signs: Vitals:   06/25/21 1346 06/25/21 1606  BP: 139/82 135/80  Pulse: 72 70  Resp: 18 18  Temp: 97.6 F (36.4 C)    SpO2: 93% 94%    General: Awake, no distress.  CV:  Good peripheral perfusion.  2+ radial pulses. Resp:  Normal effort.  Clear bilaterally. Abd:  No distention.  Soft. Other:  Cranial nerves II through XII grossly intact.  No pronator drift.  No finger dysmetria.  Symmetric 5/5 strength of all extremities.  Sensation intact to light touch in all extremities.  Unremarkable unassisted gait.  Patient acuity is 20/40 bilaterally.  Is tender over the right temple area.  Ears are unremarkable.  No significant lower extremity edema.   ED Results / Procedures / Treatments  Labs (all labs ordered are listed, but only abnormal results are displayed) Labs Reviewed  BASIC METABOLIC PANEL - Abnormal; Notable for the following components:      Result Value   Glucose, Bld 117 (*)    All other components within normal limits  D-DIMER, QUANTITATIVE - Abnormal; Notable for the following components:   D-Dimer, Quant 0.89 (*)    All other components within normal limits  CBC  BRAIN NATRIURETIC PEPTIDE  SEDIMENTATION RATE  TROPONIN I (HIGH SENSITIVITY)  TROPONIN I (HIGH SENSITIVITY)  EKG  EKG is remarkable sinus rhythm with ventricular rate of 67, left axis deviation, left bundle branch block with some nonspecific ST changes in leads I, aVL, V2 V3 and inferior leads.   RADIOLOGY Chest reviewed by myself shows no focal consoidation, effusion, edema, pneumothorax or other clear acute thoracic process. I also reviewed radiology interpretation and agree with findings described.  CT head on my interpretation not evidence of ischemia, edema, hemorrhage, mass effect or other acute intracranial process.  I also reviewed radiology's interpretation.  CTA chest my interpretation without evidence of a PE, edema, pneumothorax, effusion, pericardial effusion, pneumonia or other clear process.  I reviewed radiology interpretation and agree to findings of cardiomegaly and CAD as well as aortic  atherosclerosis.  PROCEDURES:  Critical Care performed: No  Procedures   MEDICATIONS ORDERED IN ED: Medications  acetaminophen (TYLENOL) tablet 1,000 mg (1,000 mg Oral Given 06/25/21 1801)  iohexol (OMNIPAQUE) 350 MG/ML injection 75 mL (75 mLs Intravenous Contrast Given 06/25/21 1854)     IMPRESSION / MDM / ASSESSMENT AND PLAN / ED COURSE  I reviewed the triage vital signs and the nursing notes. Patient's presentation is most consistent with acute presentation with potential threat to life or bodily function.                               With regard to patient's chest pain shortness of breath differential considerations include ACS, CHF, arrhythmia, anemia, metabolic derangements, PE, pneumonia and bronchitis and metabolic derangements.  It is unclear if this is related to her headache.  She has no prior similar headaches or any history exam features to suggest trauma or acute infectious process.  She is not sure if she got nitroglycerin with EMS but did receive some ASA.  I will obtain ESR to rule out temporal arteritis and a CT head to rule out an SAH.  EKG is remarkable sinus rhythm with ventricular rate of 67, left axis deviation, left bundle branch block with some nonspecific ST changes in leads I, aVL, V2 V3 and inferior leads.  Nonelevated troponin x2 is not suggestive of NSTEMI or myocarditis.  Chest reviewed by myself shows no focal consoidation, effusion, edema, pneumothorax or other clear acute thoracic process. I also reviewed radiology interpretation and agree with findings described.  CTA chest obtained due to elevated D-dimer my interpretation without evidence of a PE, edema, pneumothorax, effusion, pericardial effusion, pneumonia or other clear process.  I reviewed radiology interpretation and agree to findings of cardiomegaly and CAD as well as aortic atherosclerosis.  BNP is double digits and overall clinical picture is not suggestive of CHF.  CBC without leukocytosis  or acute anemia.  BMP without any significant electrolyte or metabolic derangements.  ESR 17 and patient reports significant proved in her headache after some Tylenol.  Unclear etiology at this time although given reassuring ESR and CT with onset within 6 hours prior to arrival and otherwise very reassuring neurological exam without evidence of infectious process I think this does not require further emergency department management and evaluation and she is appropriate for continued outpatient evaluation of this.   Discussed with on-call cardiologist Dr. Corky Sox who is agreement that close outpatient cardio follow-up is reasonable and patient will call cardiologist office tomorrow morning to schedule a visit for the next week.  She will return for any new or worsening of symptoms.  Discharged in stable condition.  Strict return precautions advised and discussed.  FINAL CLINICAL IMPRESSION(S) / ED DIAGNOSES   Final diagnoses:  Chest pain, unspecified type  SOB (shortness of breath)  Nonintractable headache, unspecified chronicity pattern, unspecified headache type     Rx / DC Orders   ED Discharge Orders     None        Note:  This document was prepared using Dragon voice recognition software and may include unintentional dictation errors.   Lucrezia Starch, MD 06/25/21 2010

## 2021-06-25 NOTE — ED Triage Notes (Signed)
Pt here via [facility] from home with CP. Pt states her cp has went away but started in the center of her chest with no radiation. Pt states pain felt like pressure.

## 2021-06-25 NOTE — ED Notes (Signed)
MD Z Smith ambulated pt with spo2. No desat, pt ambulated with no issue

## 2021-07-19 ENCOUNTER — Other Ambulatory Visit: Payer: Self-pay | Admitting: Internal Medicine

## 2021-07-19 DIAGNOSIS — I25118 Atherosclerotic heart disease of native coronary artery with other forms of angina pectoris: Secondary | ICD-10-CM

## 2021-07-19 DIAGNOSIS — I700 Atherosclerosis of aorta: Secondary | ICD-10-CM

## 2021-07-23 ENCOUNTER — Other Ambulatory Visit: Payer: Self-pay | Admitting: Internal Medicine

## 2021-07-23 DIAGNOSIS — I25118 Atherosclerotic heart disease of native coronary artery with other forms of angina pectoris: Secondary | ICD-10-CM

## 2021-07-23 DIAGNOSIS — I700 Atherosclerosis of aorta: Secondary | ICD-10-CM

## 2021-08-09 ENCOUNTER — Other Ambulatory Visit: Payer: Self-pay | Admitting: Internal Medicine

## 2021-08-09 DIAGNOSIS — R0602 Shortness of breath: Secondary | ICD-10-CM

## 2021-08-09 DIAGNOSIS — I25118 Atherosclerotic heart disease of native coronary artery with other forms of angina pectoris: Secondary | ICD-10-CM

## 2021-08-09 DIAGNOSIS — I700 Atherosclerosis of aorta: Secondary | ICD-10-CM

## 2021-08-14 ENCOUNTER — Ambulatory Visit: Payer: Medicare Other | Attending: Internal Medicine

## 2021-08-23 ENCOUNTER — Ambulatory Visit: Payer: Medicare Other | Attending: Internal Medicine

## 2021-08-23 DIAGNOSIS — R0602 Shortness of breath: Secondary | ICD-10-CM

## 2021-08-23 DIAGNOSIS — I25118 Atherosclerotic heart disease of native coronary artery with other forms of angina pectoris: Secondary | ICD-10-CM

## 2021-08-23 DIAGNOSIS — I700 Atherosclerosis of aorta: Secondary | ICD-10-CM

## 2021-08-23 MED ORDER — ALBUTEROL SULFATE (2.5 MG/3ML) 0.083% IN NEBU
2.5000 mg | INHALATION_SOLUTION | Freq: Once | RESPIRATORY_TRACT | Status: AC
  Filled 2021-08-23: qty 3

## 2021-08-28 ENCOUNTER — Ambulatory Visit: Payer: Medicare Other

## 2021-12-20 ENCOUNTER — Emergency Department: Payer: Medicare Other

## 2021-12-20 ENCOUNTER — Other Ambulatory Visit: Payer: Medicare Other

## 2021-12-20 ENCOUNTER — Emergency Department
Admission: EM | Admit: 2021-12-20 | Discharge: 2021-12-20 | Disposition: A | Discharge status: Home / Self Care | Payer: Medicare Other | Attending: Student in an Organized Health Care Education/Training Program | Admitting: Student in an Organized Health Care Education/Training Program

## 2021-12-20 ENCOUNTER — Other Ambulatory Visit: Payer: Self-pay

## 2021-12-20 DIAGNOSIS — W19XXXA Unspecified fall, initial encounter: Secondary | ICD-10-CM

## 2021-12-20 DIAGNOSIS — Y92002 Bathroom of unspecified non-institutional (private) residence single-family (private) house as the place of occurrence of the external cause: Secondary | ICD-10-CM | POA: Diagnosis not present

## 2021-12-20 DIAGNOSIS — M533 Sacrococcygeal disorders, not elsewhere classified: Secondary | ICD-10-CM | POA: Diagnosis not present

## 2021-12-20 DIAGNOSIS — M25561 Pain in right knee: Secondary | ICD-10-CM | POA: Diagnosis not present

## 2021-12-20 DIAGNOSIS — M25571 Pain in right ankle and joints of right foot: Secondary | ICD-10-CM | POA: Insufficient documentation

## 2021-12-20 DIAGNOSIS — S300XXA Contusion of lower back and pelvis, initial encounter: Secondary | ICD-10-CM | POA: Diagnosis not present

## 2021-12-20 DIAGNOSIS — M25551 Pain in right hip: Secondary | ICD-10-CM | POA: Insufficient documentation

## 2021-12-20 DIAGNOSIS — W1839XA Other fall on same level, initial encounter: Secondary | ICD-10-CM | POA: Diagnosis not present

## 2021-12-20 DIAGNOSIS — S3992XA Unspecified injury of lower back, initial encounter: Secondary | ICD-10-CM | POA: Diagnosis present

## 2021-12-20 MED ORDER — HYDROCODONE-ACETAMINOPHEN 7.5-325 MG/15ML PO SOLN: 5.0000 mL | Freq: Four times a day (QID) | ORAL | 0 refills | Status: DC | PRN

## 2021-12-20 MED ORDER — LIDOCAINE 5 % EX PTCH
1.0000 | MEDICATED_PATCH | Freq: Two times a day (BID) | CUTANEOUS | 0 refills | Status: DC
Start: 2021-12-20 — End: 2022-05-09

## 2021-12-20 MED ORDER — HYDROCODONE-ACETAMINOPHEN 7.5-325 MG/15ML PO SOLN
7.5000 mL | Freq: Once | ORAL | Status: AC
  Administered 2021-12-20: 7.5 mL via ORAL
  Filled 2021-12-20: qty 15

## 2021-12-20 MED ORDER — HYDROCODONE-ACETAMINOPHEN 5-325 MG PO TABS: 1.0000 | ORAL_TABLET | Freq: Once | ORAL | Status: DC

## 2021-12-20 NOTE — ED Provider Notes (Signed)
[facility] Provider Note    Event Date/Time   First MD Initiated Contact with Patient 12/20/21 1818     (approximate)   History   Fall   HPI  Cynthia Raymond is a 88 y.o. female presents to the ER for evaluation of right hip pain buttock pain tailbone pain knee pain and ankle pain that occurred after fall today.  States that she has chronic issues with right ankle frequently give out and today she was in the bathroom and it gave out.  She fell to the ground landing on her buttock hit open doors on the way down.  No lacerations did not strike her head.  Denied any chest pain or shortness of breath.  Denies any abdominal pain but is having pelvic discomfort particular with ambulating.     Physical Exam   Triage Vital Signs: ED Triage Vitals  Enc Vitals Group     BP 12/20/21 1702 (!) 149/80     Pulse Rate 12/20/21 1702 (!) 58     Resp 12/20/21 1702 20     Temp 12/20/21 1702 98.2 F (36.8 C)     Temp Source 12/20/21 1702 Oral     SpO2 12/20/21 1702 97 %     Weight 12/20/21 1702 190 lb (86.2 kg)     Height 12/20/21 1702 5' (1.524 m)     Head Circumference --      Peak Flow --      Pain Score 12/20/21 1709 10     Pain Loc --      Pain Edu? --      Excl. in [place]? --     Most recent vital signs: Vitals:   12/20/21 1702  BP: (!) 149/80  Pulse: (!) 58  Resp: 20  Temp: 98.2 F (36.8 C)  SpO2: 97%     Constitutional: Alert  Eyes: Conjunctivae are normal.  Head: Atraumatic. Nose: No congestion/rhinnorhea. Mouth/Throat: Mucous membranes are moist.   Neck: Painless ROM.  Cardiovascular:   Good peripheral circulation. Respiratory: Normal respiratory effort.  No retractions.  Gastrointestinal: Soft and nontender.  Musculoskeletal: Trace palpation of the right ankle no effusion no contusion.  Neurovascular intact distally.  Chest palpation of the right knee.  No effusion or deformity.  Calf and thigh compartments are soft.  Does have pain with  movement of the right hip primarily radiating to the buttock area where she has bilateral hematomas that are soft.  Compartments soft.  No sign of laceration or injury to the coccyx. Neurologic:  MAE spontaneously. No gross focal neurologic deficits are appreciated.  Skin:  Skin is warm, dry and intact. No rash noted. Psychiatric: Mood and affect are normal. Speech and behavior are normal.    ED Results / Procedures / Treatments   Labs (all labs ordered are listed, but only abnormal results are displayed) Labs Reviewed - No data to display   EKG     RADIOLOGY Please see ED Course for my review and interpretation.  I personally reviewed all radiographic images ordered to evaluate for the above acute complaints and reviewed radiology reports and findings.  These findings were personally discussed with the patient.  Please see medical record for radiology report.    PROCEDURES:  Critical Care performed: No  Procedures   MEDICATIONS ORDERED IN ED: Medications  HYDROcodone-acetaminophen (HYCET) 7.5-325 mg/15 ml solution 7.5 mL (7.5 mLs Oral Given 12/20/21 1906)     IMPRESSION / MDM / ASSESSMENT AND PLAN /  ED COURSE  I reviewed the triage vital signs and the nursing notes.                              Differential diagnosis includes, but is not limited to, fracture, contusion, dislocation, hematoma, spinal injury, musculoskeletal strain  Patient presents to the ER for evaluation of pain and injuries as described above.  X-ray of the hip ordered triage on my review and interpretation does not show any evidence of fracture or dislocation but on exam given her age and risk factors she does have quite a bit of pelvic pain no instability but do feel that CT imaging clinically indicated.   Clinical Course as of 12/20/21 1927  Thu Dec 20, 2021  1856 CT imaging my review and interpretation does not show any evidence of pelvic hematoma fracture or dislocation will await formal  radiology report. [PR]  1905 X-ray right knee on my review and interpretation does not show any evidence of fracture or dislocation. [PR]  1925 CT imaging and x-rays without evidence of acute abnormality.  Pain likely secondary to contusion of the buttock.  Patient does appear stable and appropriate for outpatient follow-up.  Patient and family agreeable with plan. [PR]    Clinical Course User Index [PR] Merlyn Lot, MD     FINAL CLINICAL IMPRESSION(S) / ED DIAGNOSES   Final diagnoses:  Fall, initial encounter  Contusion of buttock, initial encounter     Rx / DC Orders   ED Discharge Orders          Ordered    HYDROcodone-acetaminophen (HYCET) 7.5-325 mg/15 ml solution  Every 6 hours PRN        12/20/21 1925    lidocaine (LIDODERM) 5 %  Every 12 hours        12/20/21 1925             Note:  This document was prepared using Dragon voice recognition software and may include unintentional dictation errors.    Merlyn Lot, MD 12/20/21 Sharilyn Sites

## 2021-12-20 NOTE — ED Triage Notes (Signed)
Pt states she fell in the bathroom when she bent over to get something out of her drawer- pt states that she is having pain in her R hip- pt does have a known deformity in the R foot- pt also complaining of rectal pain

## 2021-12-20 NOTE — ED Notes (Signed)
Patient discharged at this time. Wheeled to lobby by family per request. Breathing unlabored speaking in full sentences. Verbalized understanding of all discharge, follow up, and medication teaching. Discharged homed with all belongings.

## 2022-05-09 ENCOUNTER — Emergency Department
Admission: EM | Admit: 2022-05-09 | Discharge: 2022-05-09 | Disposition: A | Discharge status: Home / Self Care | Payer: Medicare HMO | Attending: Emergency Medicine | Admitting: Emergency Medicine

## 2022-05-09 ENCOUNTER — Emergency Department: Payer: Medicare HMO

## 2022-05-09 ENCOUNTER — Encounter: Payer: Self-pay | Admitting: Emergency Medicine

## 2022-05-09 ENCOUNTER — Other Ambulatory Visit: Payer: Self-pay

## 2022-05-09 DIAGNOSIS — I10 Essential (primary) hypertension: Secondary | ICD-10-CM | POA: Diagnosis not present

## 2022-05-09 DIAGNOSIS — Z96653 Presence of artificial knee joint, bilateral: Secondary | ICD-10-CM | POA: Insufficient documentation

## 2022-05-09 DIAGNOSIS — M545 Low back pain, unspecified: Secondary | ICD-10-CM | POA: Insufficient documentation

## 2022-05-09 MED ORDER — FENTANYL CITRATE PF 50 MCG/ML IJ SOSY
50.0000 ug | PREFILLED_SYRINGE | Freq: Once | INTRAMUSCULAR | Status: AC
  Administered 2022-05-09: 50 ug via INTRAMUSCULAR
  Filled 2022-05-09: qty 1

## 2022-05-09 MED ORDER — OXYCODONE-ACETAMINOPHEN 5-325 MG PO TABS: 1.0000 | ORAL_TABLET | Freq: Three times a day (TID) | ORAL | 0 refills | Status: DC | PRN

## 2022-05-09 MED ORDER — OXYCODONE-ACETAMINOPHEN 5-325 MG PO TABS
1.0000 | ORAL_TABLET | Freq: Once | ORAL | Status: AC
  Administered 2022-05-09: 1 via ORAL
  Filled 2022-05-09: qty 1

## 2022-05-09 MED ORDER — LIDOCAINE 5 % EX PTCH
1.0000 | MEDICATED_PATCH | Freq: Once | CUTANEOUS | Status: DC
  Administered 2022-05-09: 1 via TRANSDERMAL
  Filled 2022-05-09: qty 1

## 2022-05-09 MED ORDER — LIDOCAINE 5 % EX PTCH: 1.0000 | MEDICATED_PATCH | Freq: Two times a day (BID) | CUTANEOUS | 0 refills | Status: AC | PRN

## 2022-05-09 NOTE — ED Notes (Signed)
Patient Alert and oriented to baseline. Stable and ambulatory to baseline. Patient verbalized understanding of the discharge instructions.  Patient belongings were taken by the patient.   

## 2022-05-09 NOTE — Discharge Instructions (Addendum)
Your exam and CT scan do not reveal any acute fracture to the lumbar spine.  Evidence of underlying degenerative changes and stable hardware noted.  Your symptoms likely due to an acute strain to the lower back with underlying arthritis.  Take prescription meds as directed.  Follow-up with primary provider or return to the ED if needed.

## 2022-05-09 NOTE — ED Triage Notes (Signed)
Patient to ED for lower back pain. Patient states pain started today while bending over to tie shoe. Patient had to be helped out of car and unable to walk due to pain.

## 2022-05-09 NOTE — ED Provider Notes (Signed)
[facility] Emergency Department Provider Note     Event Date/Time   First MD Initiated Contact with Patient 05/09/22 1151     (approximate)   History   Back Pain   HPI  Dalaina Tates is a 89 y.o. female with a history of HTN, osteoporosis, thyroid disease, Graves' disease, bilateral total knee replacements right total hip replacement and prior lumbar surgery, presents to the ED for evaluation of acute midline low back pain.  Patient was in her closet standing when she bent over at the waist to tie her shoes.  She reports an immediate debilitating pain to the midline lumbar sacral region when she raised up to upright position.  Since that time she has had severe pain to the midline low back patient denies any bladder or bowel incontinence, foot drop, saddle anesthesia.  No other injury reported at this time.     Physical Exam   Triage Vital Signs: ED Triage Vitals  Enc Vitals Group     BP 05/09/22 1139 123/64     Pulse Rate 05/09/22 1139 72     Resp 05/09/22 1139 18     Temp 05/09/22 1139 98.2 F (36.8 C)     Temp Source 05/09/22 1139 Oral     SpO2 05/09/22 1139 92 %     Weight --      Height --      Head Circumference --      Peak Flow --      Pain Score 05/09/22 1138 10     Pain Loc --      Pain Edu? --      Excl. in GC? --     Most recent vital signs: Vitals:   05/09/22 1139  BP: 123/64  Pulse: 72  Resp: 18  Temp: 98.2 F (36.8 C)  SpO2: 92%    General Awake, no distress. uncomfortable HEENT NCAT. PERRL. EOMI. No rhinorrhea. Mucous membranes are moist.  CV:  Good peripheral perfusion.  RESP:  Normal effort.  ABD:  No distention.  MSK:  Normal spinal alignment with mild tenderness to palpation to the lumbosacral junction.  Normal active range of motion of the upper and lower extremities bilaterally. NEURO: Cranial nerves II to XII grossly intact.  Normal LE DTRs bilaterally.  Normal toe dorsiflexion bilaterally.  ED  Results / Procedures / Treatments   Labs (all labs ordered are listed, but only abnormal results are displayed) Labs Reviewed - No data to display  EKG   RADIOLOGY  I personally viewed and evaluated these images as part of my medical decision making, as well as reviewing the written report by the radiologist.  ED Provider Interpretation: No acute findings  CT Lumbar Spine Wo Contrast  Result Date: 05/09/2022 CLINICAL DATA:  Low back pain after bending over to tie shoe. EXAM: CT LUMBAR SPINE WITHOUT CONTRAST TECHNIQUE: Multidetector CT imaging of the lumbar spine was performed without intravenous contrast administration. Multiplanar CT image reconstructions were also generated. RADIATION DOSE REDUCTION: This exam was performed according to the departmental dose-optimization program which includes automated exposure control, adjustment of the mA and/or kV according to patient size and/or use of iterative reconstruction technique. COMPARISON:  Lumbar spine CT 12/20/2021 FINDINGS: Segmentation: Standard; the lowest formed disc space is designated L5-S1. Alignment: Grade 1 retrolisthesis of L2 on L3 and grade 1 anterolisthesis of L4 on L5 are unchanged. There is no evidence of traumatic malalignment. Vertebrae: Vertebral body heights are preserved, without evidence  of acute fracture. There is no suspicious osseous lesion. Postsurgical changes reflecting posterior instrumented fusion and decompression at L3-L4 are again noted with solid fusion across the disc space and posterior elements. There is no evidence of hardware related complication. Paraspinal and other soft tissues: The paraspinal soft tissues are unremarkable. Disc levels: There is multilevel disc degeneration throughout the lumbar spine with relative sparing of the L4-L5 disc space. There is vacuum disc phenomenon at T10-T11 through L1-L2. There is endplate spurring throughout the lumbar spine. There is multilevel facet arthropathy, most  advanced at L4-L5 and L5-S1. There is probable mild spinal canal stenosis at T11-T12 through L1-L2 there is multilevel moderate neural foraminal stenosis. There is no significant spinal canal or neural foraminal stenosis at the surgical level. IMPRESSION: 1. No acute fracture or traumatic malalignment of the lumbar spine. 2. Status post L3-L4 posterior instrumented fusion and decompression without evidence of complication. 3. Multilevel degenerative changes as above. Electronically Signed   By: Lesia Hausen M.D.   On: 05/09/2022 12:38     PROCEDURES:  Critical Care performed: No  Procedures   MEDICATIONS ORDERED IN ED: Medications  lidocaine (LIDODERM) 5 % 1 patch (1 patch Transdermal Patch Applied 05/09/22 1317)  fentaNYL (SUBLIMAZE) injection 50 mcg (50 mcg Intramuscular Given 05/09/22 1226)  oxyCODONE-acetaminophen (PERCOCET/ROXICET) 5-325 MG per tablet 1 tablet (1 tablet Oral Given 05/09/22 1317)     IMPRESSION / MDM / ASSESSMENT AND PLAN / ED COURSE  I reviewed the triage vital signs and the nursing notes.                              Differential diagnosis includes, but is not limited to, lumbar compression fracture, lumbar radiculopathy, lumbar strain  Patient's presentation is most consistent with acute complicated illness / injury requiring diagnostic workup.  Patient's diagnosis is consistent with acute low back pain secondary to lumbar strain.  Patient with reassuring exam overall with no red flags on exam.  Acute pain following mechanical injury.  Patient is evaluated in the ED with CT imaging of the lumbar spine that does not reveal any acute compression fracture or cord compression syndrome.  Patient with improved pain following ED medication administration.  No indication for admission as the patient is stable at this time with no red flags on exam to support admission for pain control and/or surgical intervention.  Patient will be discharged home with prescriptions for Percocet  and Lidoderm patches. Patient is to follow up with her PCP as needed or otherwise directed. Patient is given ED precautions to return to the ED for any worsening or new symptoms.  FINAL CLINICAL IMPRESSION(S) / ED DIAGNOSES   Final diagnoses:  Acute midline low back pain without sciatica     Rx / DC Orders   ED Discharge Orders          Ordered    oxyCODONE-acetaminophen (PERCOCET) 5-325 MG tablet  Every 8 hours PRN        05/09/22 1334    lidocaine (LIDODERM) 5 %  Every 12 hours PRN        05/09/22 1334             Note:  This document was prepared using Dragon voice recognition software and may include unintentional dictation errors.    Lissa Hoard, PA-C 05/09/22 1353    Jene Every, MD 05/09/22 1359

## 2022-07-16 ENCOUNTER — Ambulatory Visit: Payer: Medicare HMO | Admitting: Podiatry

## 2022-07-16 DIAGNOSIS — M216X1 Other acquired deformities of right foot: Secondary | ICD-10-CM

## 2022-07-16 DIAGNOSIS — M216X2 Other acquired deformities of left foot: Secondary | ICD-10-CM | POA: Diagnosis not present

## 2022-07-16 NOTE — Progress Notes (Signed)
Subjective:  Patient ID: Cynthia Raymond, female    DOB: August 14, 1929,  MRN: [id number]  Chief Complaint  Patient presents with   Foot Pain    89 y.o. female presents with the above complaint.  Patient presents with bilateral cavovarus foot structure with both foot turning inward right greater than left side.  She states that she does not have any underlying neurological issues.  She wanted to get it evaluated she has weakening of the peroneal tendons which could likely be leading to this.  She does not wear any bracing for it.  She has not seen anyone else prior to seeing me.   Review of Systems: Negative except as noted in the HPI. Denies N/V/F/Ch.  Past Medical History:  Diagnosis Date   Cancer (HCC)    breast   Graves disease    Hypertension    Osteoporosis    Thyroid disease     Current Outpatient Medications:    acetaminophen (TYLENOL) 500 MG tablet, Take 1,000 mg by mouth every 8 (eight) hours as needed., Disp: , Rfl:    aspirin 81 MG tablet, Take 81 mg by mouth daily., Disp: , Rfl:    atorvastatin (LIPITOR) 80 MG tablet, Take 1 tablet by mouth daily., Disp: , Rfl:    Cholecalciferol (VITAMIN D PO), Take 2 capsules by mouth daily., Disp: , Rfl:    Cyanocobalamin (VITAMIN B 12 PO), Take 1 tablet by mouth daily., Disp: , Rfl:    Cyanocobalamin (VITAMIN B 12 PO), Take 1 tablet by mouth daily., Disp: , Rfl:    levothyroxine (SYNTHROID, LEVOTHROID) 150 MCG tablet, Take 150 mcg by mouth daily before breakfast., Disp: , Rfl:    Melatonin 3 MG CAPS, Take 2 tablets by mouth at bedtime., Disp: , Rfl:    oxyCODONE-acetaminophen (PERCOCET) 5-325 MG tablet, Take 1 tablet by mouth every 8 (eight) hours as needed for severe pain., Disp: 15 tablet, Rfl: 0   pregabalin (LYRICA) 25 MG capsule, Take 1 capsule (25 mg total) by mouth at bedtime for 30 days, THEN 2 capsules (50 mg total) at bedtime as needed., Disp: 90 capsule, Rfl: 0   ramipril (ALTACE) 10 MG capsule, Take 10 mg by mouth  daily., Disp: , Rfl:    traMADol (ULTRAM) 50 MG tablet, Take 1 tablet (50 mg total) by mouth every 6 (six) hours as needed., Disp: 15 tablet, Rfl: 0   venlafaxine XR (EFFEXOR-XR) 150 MG 24 hr capsule, Take 150 mg by mouth daily with breakfast., Disp: , Rfl:    vitamin C (ASCORBIC ACID) 500 MG tablet, Take 500 mg by mouth daily., Disp: , Rfl:  No current facility-administered medications for this visit.  Facility-Administered Medications Ordered in Other Visits:    albuterol (PROVENTIL) (2.5 MG/3ML) 0.083% nebulizer solution 2.5 mg, 2.5 mg, Nebulization, Once, Salena Saner, MD  Social History   Tobacco Use  Smoking Status Former   Current packs/day: 0.00   Types: Cigarettes   Quit date: 01/08/1987   Years since quitting: 35.5  Smokeless Tobacco Never  Tobacco Comments   Quit 1989    Allergies  Allergen Reactions   Penicillins Anaphylaxis and Shortness Of Breath   Amitriptyline Other (See Comments)    Yeast infection    Latex     Pt states it is ok to use briefly    Morphine And Codeine Other (See Comments)    AMS   Sulfa Antibiotics Other (See Comments)    Tongue and lip swelling   Objective:  There were no vitals filed for this visit. There is no height or weight on file to calculate BMI. Constitutional Well developed. Well nourished.  Vascular Dorsalis pedis pulses palpable bilaterally. Posterior tibial pulses palpable bilaterally. Capillary refill normal to all digits.  No cyanosis or clubbing noted. Pedal hair growth normal.  Neurologic Normal speech. Oriented to person, place, and time. Epicritic sensation to light touch grossly present bilaterally.  Dermatologic Nails well groomed and normal in appearance. No open wounds. No skin lesions.  Orthopedic: Cavovarus foot structure noted with weakening of the peroneal tendons.  Peroneal tendon strength 3 out of 5.  Posterior tibial tendon strength 5 out of 5.  No spastic peroneal noted.  Right greater than left  side   Radiographs: None Assessment:   1. Acquired bilateral cavovarus deformity    Plan:  Patient was evaluated and treated and all questions answered.  Bilateral cavovarus deformity with weakening peroneal tendons -All questions and concerns were discussed with the patient in extensive detail.  I discussed with her that due to the weakening of the peroneal tendons the posterior tibial tendon is overpowering the peroneal tendons leading to cavovarus foot structure.  It is semiflexible in nature therefore I believe patient will benefit from bracing -[facility] prescription was given to obtain medial brace/[place] brace for cavovarus deformity.  She states understanding will obtain at  No follow-ups on file.   Bilateral cavovarus deformity with weakening peroneal tendons.  [facility] prescription given for bracing

## 2023-01-09 ENCOUNTER — Emergency Department: Payer: Medicare HMO

## 2023-01-09 ENCOUNTER — Other Ambulatory Visit: Payer: Self-pay

## 2023-01-09 ENCOUNTER — Emergency Department
Admission: EM | Admit: 2023-01-09 | Discharge: 2023-01-09 | Disposition: A | Discharge status: Home / Self Care | Payer: Medicare HMO | Attending: Emergency Medicine | Admitting: Emergency Medicine

## 2023-01-09 ENCOUNTER — Encounter: Payer: Self-pay | Admitting: Emergency Medicine

## 2023-01-09 DIAGNOSIS — M4316 Spondylolisthesis, lumbar region: Secondary | ICD-10-CM | POA: Diagnosis not present

## 2023-01-09 DIAGNOSIS — M48061 Spinal stenosis, lumbar region without neurogenic claudication: Secondary | ICD-10-CM | POA: Diagnosis not present

## 2023-01-09 DIAGNOSIS — M5442 Lumbago with sciatica, left side: Secondary | ICD-10-CM | POA: Diagnosis not present

## 2023-01-09 DIAGNOSIS — M545 Low back pain, unspecified: Secondary | ICD-10-CM | POA: Diagnosis present

## 2023-01-09 MED ORDER — OXYCODONE HCL 5 MG PO TABS: 5.0000 mg | ORAL_TABLET | Freq: Three times a day (TID) | ORAL | 0 refills | Status: AC | PRN

## 2023-01-09 MED ORDER — OXYCODONE-ACETAMINOPHEN 5-325 MG PO TABS
1.0000 | ORAL_TABLET | Freq: Once | ORAL | Status: AC
  Administered 2023-01-09: 1 via ORAL
  Filled 2023-01-09: qty 1

## 2023-01-09 NOTE — ED Triage Notes (Signed)
 Patient to ED via POV for lower back that radiates into left leg. Painful with ambulation. Ongoing since Thanksgiving. Has been using lidocaine patches with no relief.

## 2023-01-09 NOTE — Discharge Instructions (Addendum)
 You were seen in the emergency room today for evaluation of your back pain.  I sent a prescription for oxycodone  to your pharmacy that you can take as needed for breakthrough pain.  This does make you drowsy, do not drive or operate machinery when taking this.  You can continue to take Tylenol  and use lidocaine  patches.  Follow-up with your primary care doctor for further evaluation.  Return to the ER for new or worsening symptoms.

## 2023-01-09 NOTE — ED Provider Notes (Signed)
 [facility] Provider Note    Event Date/Time   First MD Initiated Contact with Patient 01/09/23 1310     (approximate)   History   Back Pain   HPI  Cynthia Raymond is a 89 year old female presenting to the emergency department for evaluation of back pain.  Accompanied by daughter who provides collateral history.  On Thanksgiving, patient was cooking dinner and was on her feet more than usual.  No trauma, but since that time she has had worsening low back pain radiating into her left leg.  Has a history of similar episodes with flares a few times a year.  Has been using Tylenol  and lidocaine  patches with limited benefit.  Patient reports improvement with oxycodone  with similar episodes in the past.  No other numbness, tingling, focal weakness.  No bowel or bladder symptoms.  No reported fevers.     Physical Exam   Triage Vital Signs: ED Triage Vitals  Encounter Vitals Group     BP 01/09/23 1219 (!) 156/77     Systolic BP Percentile --      Diastolic BP Percentile --      Pulse Rate 01/09/23 1219 (!) 59     Resp 01/09/23 1219 17     Temp 01/09/23 1219 97.6 F (36.4 C)     Temp Source 01/09/23 1219 Oral     SpO2 01/09/23 1219 100 %     Weight 01/09/23 1225 189 lb 9.5 oz (86 kg)     Height 01/09/23 1225 5' (1.524 m)     Head Circumference --      Peak Flow --      Pain Score 01/09/23 1224 9     Pain Loc --      Pain Education --      Exclude from Growth Chart --     Most recent vital signs: Vitals:   01/09/23 1219  BP: (!) 156/77  Pulse: (!) 59  Resp: 17  Temp: 97.6 F (36.4 C)  SpO2: 100%     General: Awake, interactive  CV:  Regular rate, good peripheral perfusion.  Resp:  Unlabored respirations.  Abd:  Nondistended.  Neuro:  Symmetric facial movement, fluid speech, sensation intact  MSK:   Midline pain in the lumbar spine into the left paraspinous area without overlying skin changes.  Pain with attempts at hip flexion on the  left side, but is able to ambulate.   ED Results / Procedures / Treatments   Labs (all labs ordered are listed, but only abnormal results are displayed) Labs Reviewed - No data to display   EKG EKG independently reviewed interpreted by myself (ER attending) demonstrates:    RADIOLOGY Imaging independently reviewed and interpreted by myself demonstrates:  CT lumbar spine without obvious fracture on my review though significant degenerative changes noted, formal radiology read pending  PROCEDURES:  Critical Care performed: No  Procedures   MEDICATIONS ORDERED IN ED: Medications  oxyCODONE -acetaminophen  (PERCOCET /ROXICET) 5-325 MG per tablet 1 tablet (1 tablet Oral Given 01/09/23 1353)     IMPRESSION / MDM / ASSESSMENT AND PLAN / ED COURSE  I reviewed the triage vital signs and the nursing notes.  Differential diagnosis includes, but is not limited to, exacerbation of chronic back pain with sciatica, no clinical history suggestive of cauda equina or acute spinal cord pathology, compression fracture  Patient's presentation is most consistent with acute complicated illness / injury requiring diagnostic workup.  89 year old female presenting to the emergency department  for evaluation of back pain.  No clear trauma, but at increased risk for fracture with age.  Fortunately without red flag symptoms suggestive of spinal cord pathology.  Reports improvement with oxycodone  in the past, do think narcotics are reasonable given her age and prior tolerance of this.  Ordered for dose of Percocet .  CT lumbar spine ordered to further evaluate.   Clinical Course as of 01/09/23 1522  Thu Jan 09, 2023  1423 Patient reassessed.  Feeling improved with pain medication.  No obvious fracture on my review of patient's CT, significant degenerative changes.  If radiology agrees that CT is without acute findings, patient would like to be discharged home with pain control. [NR]  1504 Signed out to  oncoming physician at 1500 pending CT, reevaluation, and disposition. [NR]  1505 Presents with lower back pain, no concern for cauda equina. No need for MRI.  CT lumbar and plan for PO pain medications, f/u outpatient.  [SM]    Clinical Course User Index [NR] Levander Slate, MD [SM] Suzanne Kirsch, MD     FINAL CLINICAL IMPRESSION(S) / ED DIAGNOSES   Final diagnoses:  Acute left-sided low back pain with left-sided sciatica     Rx / DC Orders   ED Discharge Orders          Ordered    oxyCODONE  (ROXICODONE ) 5 MG immediate release tablet  Every 8 hours PRN        01/09/23 1521             Note:  This document was prepared using Dragon voice recognition software and may include unintentional dictation errors.   Levander Slate, MD 01/09/23 [phone]

## 2023-01-09 NOTE — ED Provider Notes (Signed)
 Care assumed of patient from outgoing provider.  See their note for initial history, exam and plan.  Clinical Course as of 01/09/23 1610  Thu Jan 09, 2023  1423 Patient reassessed.  Feeling improved with pain medication.  No obvious fracture on my review of patient's CT, significant degenerative changes.  If radiology agrees that CT is without acute findings, patient would like to be discharged home with pain control. [NR]  1504 Signed out to oncoming physician at 1500 pending CT, reevaluation, and disposition. [NR]  1505 Presents with lower back pain, no concern for cauda equina. No need for MRI.  CT lumbar and plan for PO pain medications, f/u outpatient.  [SM]    Clinical Course User Index [NR] Levander Slate, MD [SM] Suzanne Kirsch, MD  CT scan negative for acute fracture.  Discharged home with pain control.   Suzanne Kirsch, MD 01/09/23 [phone]

## 2023-01-13 ENCOUNTER — Ambulatory Visit: Payer: Medicare HMO | Admitting: Psychiatry

## 2023-01-27 ENCOUNTER — Emergency Department: Payer: Medicare HMO

## 2023-01-27 ENCOUNTER — Observation Stay
Admission: EM | Admit: 2023-01-27 | Discharge: 2023-01-28 | Disposition: A | Discharge status: Home / Self Care | Payer: Medicare HMO | Attending: Internal Medicine | Admitting: Internal Medicine

## 2023-01-27 ENCOUNTER — Other Ambulatory Visit: Payer: Self-pay

## 2023-01-27 DIAGNOSIS — R1011 Right upper quadrant pain: Secondary | ICD-10-CM | POA: Diagnosis not present

## 2023-01-27 DIAGNOSIS — Z515 Encounter for palliative care: Secondary | ICD-10-CM | POA: Insufficient documentation

## 2023-01-27 DIAGNOSIS — I2510 Atherosclerotic heart disease of native coronary artery without angina pectoris: Secondary | ICD-10-CM | POA: Diagnosis not present

## 2023-01-27 DIAGNOSIS — Z91040 Latex allergy status: Secondary | ICD-10-CM | POA: Diagnosis not present

## 2023-01-27 DIAGNOSIS — R0789 Other chest pain: Secondary | ICD-10-CM | POA: Diagnosis present

## 2023-01-27 DIAGNOSIS — Z96612 Presence of left artificial shoulder joint: Secondary | ICD-10-CM | POA: Diagnosis not present

## 2023-01-27 DIAGNOSIS — R0602 Shortness of breath: Secondary | ICD-10-CM | POA: Insufficient documentation

## 2023-01-27 DIAGNOSIS — Z853 Personal history of malignant neoplasm of breast: Secondary | ICD-10-CM | POA: Diagnosis not present

## 2023-01-27 DIAGNOSIS — Z87891 Personal history of nicotine dependence: Secondary | ICD-10-CM | POA: Diagnosis not present

## 2023-01-27 DIAGNOSIS — K5730 Diverticulosis of large intestine without perforation or abscess without bleeding: Secondary | ICD-10-CM | POA: Diagnosis not present

## 2023-01-27 DIAGNOSIS — Z7982 Long term (current) use of aspirin: Secondary | ICD-10-CM | POA: Diagnosis not present

## 2023-01-27 DIAGNOSIS — Z79899 Other long term (current) drug therapy: Secondary | ICD-10-CM | POA: Insufficient documentation

## 2023-01-27 DIAGNOSIS — F32A Depression, unspecified: Secondary | ICD-10-CM

## 2023-01-27 DIAGNOSIS — Z96653 Presence of artificial knee joint, bilateral: Secondary | ICD-10-CM | POA: Diagnosis not present

## 2023-01-27 DIAGNOSIS — I4891 Unspecified atrial fibrillation: Secondary | ICD-10-CM | POA: Diagnosis not present

## 2023-01-27 DIAGNOSIS — Z1152 Encounter for screening for COVID-19: Secondary | ICD-10-CM | POA: Insufficient documentation

## 2023-01-27 DIAGNOSIS — E039 Hypothyroidism, unspecified: Secondary | ICD-10-CM | POA: Diagnosis not present

## 2023-01-27 DIAGNOSIS — I2699 Other pulmonary embolism without acute cor pulmonale: Principal | ICD-10-CM | POA: Diagnosis present

## 2023-01-27 DIAGNOSIS — R519 Headache, unspecified: Secondary | ICD-10-CM | POA: Insufficient documentation

## 2023-01-27 DIAGNOSIS — I10 Essential (primary) hypertension: Secondary | ICD-10-CM | POA: Diagnosis not present

## 2023-01-27 DIAGNOSIS — J449 Chronic obstructive pulmonary disease, unspecified: Secondary | ICD-10-CM | POA: Diagnosis not present

## 2023-01-27 LAB — PROTIME-INR
INR: 1 (ref 0.8–1.2)
Prothrombin Time: 13.7 s (ref 11.4–15.2)

## 2023-01-27 LAB — HEPATIC FUNCTION PANEL
ALT: 21 U/L (ref 0–44)
AST: 22 U/L (ref 15–41)
Albumin: 3.5 g/dL (ref 3.5–5.0)
Alkaline Phosphatase: 45 U/L (ref 38–126)
Bilirubin, Direct: 0.1 mg/dL (ref 0.0–0.2)
Indirect Bilirubin: 0.6 mg/dL (ref 0.3–0.9)
Total Bilirubin: 0.7 mg/dL (ref 0.0–1.2)
Total Protein: 6.3 g/dL — ABNORMAL LOW (ref 6.5–8.1)

## 2023-01-27 LAB — LIPASE, BLOOD: Lipase: 40 U/L (ref 11–51)

## 2023-01-27 LAB — CBC WITH DIFFERENTIAL/PLATELET
Abs Immature Granulocytes: 0.02 10*3/uL (ref 0.00–0.07)
Basophils Absolute: 0 10*3/uL (ref 0.0–0.1)
Basophils Relative: 1 %
Eosinophils Absolute: 0.1 10*3/uL (ref 0.0–0.5)
Eosinophils Relative: 2 %
HCT: 39.6 % (ref 36.0–46.0)
Hemoglobin: 13.2 g/dL (ref 12.0–15.0)
Immature Granulocytes: 0 %
Lymphocytes Relative: 21 %
Lymphs Abs: 1.3 10*3/uL (ref 0.7–4.0)
MCH: 32.8 pg (ref 26.0–34.0)
MCHC: 33.3 g/dL (ref 30.0–36.0)
MCV: 98.5 fL (ref 80.0–100.0)
Monocytes Absolute: 0.8 10*3/uL (ref 0.1–1.0)
Monocytes Relative: 12 %
Neutro Abs: 4 10*3/uL (ref 1.7–7.7)
Neutrophils Relative %: 64 %
Platelets: 209 10*3/uL (ref 150–400)
RBC: 4.02 MIL/uL (ref 3.87–5.11)
RDW: 13.4 % (ref 11.5–15.5)
WBC: 6.2 10*3/uL (ref 4.0–10.5)
nRBC: 0 % (ref 0.0–0.2)

## 2023-01-27 LAB — TROPONIN I (HIGH SENSITIVITY)
Troponin I (High Sensitivity): 7 ng/L (ref <18)
Troponin I (High Sensitivity): 7 ng/L (ref <18)

## 2023-01-27 LAB — BASIC METABOLIC PANEL
Anion gap: 9 (ref 5–15)
BUN: 20 mg/dL (ref 8–23)
CO2: 23 mmol/L (ref 22–32)
Calcium: 8.4 mg/dL — ABNORMAL LOW (ref 8.9–10.3)
Chloride: 103 mmol/L (ref 98–111)
Creatinine, Ser: 1.12 mg/dL — ABNORMAL HIGH (ref 0.44–1.00)
GFR, Estimated: 46 mL/min — ABNORMAL LOW (ref >60)
Glucose, Bld: 119 mg/dL — ABNORMAL HIGH (ref 70–99)
Potassium: 4.6 mmol/L (ref 3.5–5.1)
Sodium: 135 mmol/L (ref 135–145)

## 2023-01-27 LAB — RESP PANEL BY RT-PCR (RSV, FLU A&B, COVID)  RVPGX2
Influenza A by PCR: NEGATIVE
Influenza B by PCR: NEGATIVE
Resp Syncytial Virus by PCR: NEGATIVE
SARS Coronavirus 2 by RT PCR: NEGATIVE

## 2023-01-27 LAB — APTT: aPTT: 24 s (ref 24–36)

## 2023-01-27 MED ORDER — OXYCODONE-ACETAMINOPHEN 5-325 MG PO TABS
1.0000 | ORAL_TABLET | Freq: Three times a day (TID) | ORAL | Status: DC | PRN
  Administered 2023-01-28: 1 via ORAL
  Filled 2023-01-27: qty 1

## 2023-01-27 MED ORDER — MELATONIN 5 MG PO TABS
5.0000 mg | ORAL_TABLET | Freq: Every day | ORAL | Status: DC
  Administered 2023-01-27: 5 mg via ORAL
  Filled 2023-01-27: qty 1

## 2023-01-27 MED ORDER — LEVOTHYROXINE SODIUM 112 MCG PO TABS
112.0000 ug | ORAL_TABLET | Freq: Every day | ORAL | Status: DC
  Administered 2023-01-28: 112 ug via ORAL
  Filled 2023-01-27: qty 1

## 2023-01-27 MED ORDER — ACETAMINOPHEN 500 MG PO TABS
1000.0000 mg | ORAL_TABLET | Freq: Three times a day (TID) | ORAL | Status: DC | PRN
  Filled 2023-01-27: qty 2

## 2023-01-27 MED ORDER — ASPIRIN 81 MG PO TBEC
81.0000 mg | DELAYED_RELEASE_TABLET | Freq: Every day | ORAL | Status: DC
  Administered 2023-01-28: 81 mg via ORAL
  Filled 2023-01-27: qty 1

## 2023-01-27 MED ORDER — ENOXAPARIN SODIUM 100 MG/ML IJ SOSY
1.0000 mg/kg | PREFILLED_SYRINGE | Freq: Two times a day (BID) | INTRAMUSCULAR | Status: DC
  Administered 2023-01-27 – 2023-01-28 (×2): 87.5 mg via SUBCUTANEOUS
  Filled 2023-01-27 (×3): qty 1

## 2023-01-27 MED ORDER — IOHEXOL 350 MG/ML SOLN
100.0000 mL | Freq: Once | INTRAVENOUS | Status: AC | PRN
  Administered 2023-01-27: 100 mL via INTRAVENOUS

## 2023-01-27 MED ORDER — VITAMIN C 500 MG PO TABS
500.0000 mg | ORAL_TABLET | Freq: Every day | ORAL | Status: DC
  Administered 2023-01-28: 500 mg via ORAL
  Filled 2023-01-27: qty 1

## 2023-01-27 MED ORDER — LEVOTHYROXINE SODIUM 50 MCG PO TABS: 150.0000 ug | ORAL_TABLET | Freq: Every day | ORAL | Status: DC

## 2023-01-27 MED ORDER — ACETAMINOPHEN 500 MG PO TABS
1000.0000 mg | ORAL_TABLET | Freq: Three times a day (TID) | ORAL | Status: DC | PRN
  Administered 2023-01-27: 1000 mg via ORAL
  Filled 2023-01-27: qty 2

## 2023-01-27 MED ORDER — VENLAFAXINE HCL ER 75 MG PO CP24
225.0000 mg | ORAL_CAPSULE | Freq: Every day | ORAL | Status: DC
  Administered 2023-01-28: 225 mg via ORAL
  Filled 2023-01-27 (×2): qty 3

## 2023-01-27 MED ORDER — RAMIPRIL 5 MG PO CAPS
10.0000 mg | ORAL_CAPSULE | Freq: Every day | ORAL | Status: DC
  Administered 2023-01-28: 10 mg via ORAL
  Filled 2023-01-27 (×2): qty 2
  Filled 2023-01-27 (×2): qty 1

## 2023-01-27 MED ORDER — HEPARIN (PORCINE) 25000 UT/250ML-% IV SOLN: 1000.0000 [IU]/h | INTRAVENOUS | Status: DC

## 2023-01-27 MED ORDER — ATORVASTATIN CALCIUM 20 MG PO TABS
80.0000 mg | ORAL_TABLET | Freq: Every day | ORAL | Status: DC
  Administered 2023-01-27 – 2023-01-28 (×2): 80 mg via ORAL
  Filled 2023-01-27 (×2): qty 4

## 2023-01-27 MED ORDER — HEPARIN BOLUS VIA INFUSION
4000.0000 [IU] | Freq: Once | INTRAVENOUS | Status: DC
  Filled 2023-01-27: qty 4000

## 2023-01-27 NOTE — ED Provider Notes (Signed)
[facility] Provider Note    Event Date/Time   First MD Initiated Contact with Patient 01/27/23 1027     (approximate)   History   Chest Pain   HPI {Remember to add pertinent medical, surgical, social, and/or OB history to HPI:1} Cynthia Raymond is a 89 y.o. female  ***       Physical Exam   Triage Vital Signs: ED Triage Vitals  Encounter Vitals Group     BP 01/27/23 1035 116/66     Systolic BP Percentile --      Diastolic BP Percentile --      Pulse Rate 01/27/23 1035 69     Resp 01/27/23 1035 16     Temp 01/27/23 1035 98.6 F (37 C)     Temp Source 01/27/23 1035 Oral     SpO2 01/27/23 1035 98 %     Weight --      Height --      Head Circumference --      Peak Flow --      Pain Score 01/27/23 1028 0     Pain Loc --      Pain Education --      Exclude from Growth Chart --     Most recent vital signs: Vitals:   01/27/23 1035  BP: 116/66  Pulse: 69  Resp: 16  Temp: 98.6 F (37 C)  SpO2: 98%    {Only need to document appropriate and relevant physical exam:1} General: Awake, no distress. *** CV:  Good peripheral perfusion. *** Resp:  Normal effort. *** Abd:  No distention. *** Other:  ***   ED Results / Procedures / Treatments   Labs (all labs ordered are listed, but only abnormal results are displayed) Labs Reviewed - No data to display   EKG  I, Phineas Semen, attending physician, personally viewed and interpreted this EKG  EKG Time: 1036 Rate: 68 Rhythm: sinus rhythm Axis: left axis deviation Intervals: qtc 467 QRS: LBBB ST changes: no st elevation Impression: abnormal ekg   RADIOLOGY I independently interpreted and visualized the ***. My interpretation: *** Radiology interpretation: ***    PROCEDURES:  Critical Care performed: Yes  CRITICAL CARE Performed by: Phineas Semen   Total critical care time: *** minutes  Critical care time was exclusive of separately billable procedures and  treating other patients.  Critical care was necessary to treat or prevent imminent or life-threatening deterioration.  Critical care was time spent personally by me on the following activities: development of treatment plan with patient and/or surrogate as well as nursing, discussions with consultants, evaluation of patient's response to treatment, examination of patient, obtaining history from patient or surrogate, ordering and performing treatments and interventions, ordering and review of laboratory studies, ordering and review of radiographic studies, pulse oximetry and re-evaluation of patient's condition.   Procedures    MEDICATIONS ORDERED IN ED: Medications - No data to display   IMPRESSION / MDM / ASSESSMENT AND PLAN / ED COURSE  I reviewed the triage vital signs and the nursing notes.                              Differential diagnosis includes, but is not limited to, ***  Patient's presentation is most consistent with {EM COPA:27473}   ***The patient is on the cardiac monitor to evaluate for evidence of arrhythmia and/or significant heart rate changes.  ***  FINAL CLINICAL IMPRESSION(S) / ED DIAGNOSES   Final diagnoses:  None     Rx / DC Orders   ED Discharge Orders     None        Note:  This document was prepared using Dragon voice recognition software and may include unintentional dictation errors.

## 2023-01-27 NOTE — H&P (Signed)
History and Physical    Patient: Cynthia Raymond ZOX:[id number] DOB: 1929/11/14 DOA: 01/27/2023 DOS: the patient was seen and examined on 01/27/2023 PCP: [name], MD  Patient coming from: Home  Chief Complaint:  Chief Complaint  Patient presents with   Chest Pain   HPI: Cynthia Raymond is a 89 y.o. female with medical history significant of HTN, HLD, A. fib, COPD, CAD, breast cancer, hypothyroidism, chronic pain came into the hospital via EMS for chest pain that started this morning.  She was given aspirin and nitroglycerin.  Her chest pain is resolved.  While in the ED she underwent CT scan of her chest which shows nonocclusive PE involving the lobar, segmental and subsegmental branches of the right lower lobe.  No evidence of right heart strain.  When I saw her she is complaining of some headache but otherwise no other issues.  Her daughter is at bedside. Review of Systems: As mentioned in the history of present illness. All other systems reviewed and are negative. Past Medical History:  Diagnosis Date   Cancer (HCC)    breast   Graves disease    Hypertension    Osteoporosis    Thyroid disease    Past Surgical History:  Procedure Laterality Date   BACK SURGERY     X3   BREAST LUMPECTOMY     CATARACT EXTRACTION W/PHACO Left 02/18/2018   Procedure: CATARACT EXTRACTION PHACO AND INTRAOCULAR LENS PLACEMENT (IOC) LEFT;  Surgeon: [name], MD;  Location: [facility];  Service: Ophthalmology;  Laterality: Left;   CATARACT EXTRACTION W/PHACO Right 03/17/2018   Procedure: CATARACT EXTRACTION PHACO AND INTRAOCULAR LENS PLACEMENT (IOC)  RIGHT;  Surgeon: [name], MD;  Location: [facility];  Service: Ophthalmology;  Laterality: Right;   ELBOW SURGERY Right    HAND SURGERY Right    JOINT REPLACEMENT Bilateral    Both knees, left shoulder   ROTATOR CUFF REPAIR Bilateral    Social History:  reports that she quit smoking about 36  years ago. She has never used smokeless tobacco. She reports that she does not drink alcohol and does not use drugs.  Allergies  Allergen Reactions   Penicillins Anaphylaxis and Shortness Of Breath   Amitriptyline Other (See Comments)    Yeast infection    Latex     Pt states it is ok to use briefly    Morphine And Codeine Other (See Comments)    AMS   Sulfa Antibiotics Other (See Comments)    Tongue and lip swelling    Family History  Problem Relation Age of Onset   Cancer Mother    Heart attack Father    Cancer Sister    Heart disease Sister    Hyperlipidemia Sister    Heart attack Sister    Cancer Brother    Cancer Daughter     Prior to Admission medications   Medication Sig Start Date End Date Taking? Authorizing Provider  levothyroxine (SYNTHROID) 112 MCG tablet Take 112 mcg by mouth daily before breakfast. 01/10/23  Yes [provider]  predniSONE (DELTASONE) 10 MG tablet 40mg  x 3 days, 30mg  x 3 days, 20mg  x 3 days, 10mg  x 3 days 01/17/23  Yes [provider]  traZODone (DESYREL) 100 MG tablet Take 150 mg by mouth at bedtime. 11/22/22  Yes [provider]  Vitamin D, Ergocalciferol, (DRISDOL) 1.25 MG (50000 UNIT) CAPS capsule Take 50,000 Units by mouth every 30 (thirty) days. 01/22/23  Yes [provider]  acetaminophen (TYLENOL) 500 MG tablet Take 1,000 mg by mouth every 8 (eight) hours as needed. 11/26/18   [provider]  aspirin 81 MG tablet Take 81 mg by mouth daily.    [provider]  atorvastatin (LIPITOR) 80 MG tablet Take 1 tablet by mouth daily. 04/26/19   [provider]  Cholecalciferol (VITAMIN D PO) Take 2 capsules by mouth daily.    [provider]  Cyanocobalamin (VITAMIN B 12 PO) Take 1 tablet by mouth daily.    [provider]  Cyanocobalamin (VITAMIN B 12 PO) Take 1 tablet by mouth daily.    [provider]  levothyroxine (SYNTHROID, LEVOTHROID) 150 MCG tablet Take  150 mcg by mouth daily before breakfast.    [provider]  Melatonin 3 MG CAPS Take 2 tablets by mouth at bedtime.    [provider]  oxyCODONE-acetaminophen (PERCOCET) 5-325 MG tablet Take 1 tablet by mouth every 8 (eight) hours as needed for severe pain. 05/09/22 05/09/23  Menshew, [name], PA-C  pantoprazole (PROTONIX) 40 MG tablet Take 40 mg by mouth every morning.    [provider]  pregabalin (LYRICA) 25 MG capsule Take 1 capsule (25 mg total) by mouth at bedtime for 30 days, THEN 2 capsules (50 mg total) at bedtime as needed. 05/25/20 07/24/20  [name], MD  ramipril (ALTACE) 10 MG capsule Take 10 mg by mouth daily.    [provider]  traMADol (ULTRAM) 50 MG tablet Take 1 tablet (50 mg total) by mouth every 6 (six) hours as needed. 06/28/19   [name] [name], PA-C  venlafaxine XR (EFFEXOR-XR) 150 MG 24 hr capsule Take 150 mg by mouth daily with breakfast.    [provider]  venlafaxine XR (EFFEXOR-XR) 75 MG 24 hr capsule Take 75 mg by mouth daily.    [provider]  vitamin C (ASCORBIC ACID) 500 MG tablet Take 500 mg by mouth daily.    [provider]    Physical Exam: Vitals:   01/27/23 1300 01/27/23 1330 01/27/23 1500 01/27/23 1600  BP: (!) 120/94 (!) 147/64 (!) 145/92   Pulse: (!) 55 (!) 59 65 (!) 58  Resp: 15 15 (!) 22 20  Temp:      TempSrc:      SpO2:      Weight:    86.5 kg  Height:    5' (1.22 m)   89 year old female lying in the bed comfortably without any acute distress Lungs clear to auscultation bilaterally Heart regular rate and rhythm Abdomen soft, benign Neuro alert and awake, nonfocal Skin no rash or lesion Psych normal mood and affect Data Reviewed:  CT chest confirming acute PE  Assessment and Plan:  89 y.o. female with medical history significant of HTN, HLD, A. fib, COPD, CAD, breast cancer, hypothyroidism, chronic pain admitted for new onset PE  Pulmonary embolism Confirmed  on CT angio chest Start on full dose anticoagulation/Lovenox with transition to Eliquis at discharge hopefully tomorrow No right heart strain, will obtain 2D echo Monitor on and off unit telemetry  Hypothyroidism Continue levothyroxine, check TSH  Essential hypertension Continue ramipril for now  Acute intractable tension-type headache Could be due to nitroglycerin continue Tylenol for now     Advance Care Planning:   Code Status: Full Code consult palliative care  Consults: Palliative care  Family Communication: Daughter updated at bedside  Severity of Illness: The appropriate patient status for this patient is OBSERVATION. Observation status is judged  to be reasonable and necessary in order to provide the required intensity of service to ensure the patient's safety. The patient's presenting symptoms, physical exam findings, and initial radiographic and laboratory data in the context of their medical condition is felt to place them at decreased risk for further clinical deterioration. Furthermore, it is anticipated that the patient will be medically stable for discharge from the hospital within 2 midnights of admission.   Author: Delfino Lovett, MD 01/27/2023 5:01 PM  For on call review [email or web address].

## 2023-01-27 NOTE — ED Notes (Signed)
CCMD called to have patient admitted to monitor.

## 2023-01-27 NOTE — ED Triage Notes (Signed)
Patient comes in from home via [facility] with complaints of chest pain that started about an hour ago. Pt given 324 of aspirin, and 1 spray of nitroglycerin with EMS. Pt is alert and oriented x4, and has no complaints of pain or discomfort. Pt also with no signs of acute distress at this time.

## 2023-01-27 NOTE — Consult Note (Addendum)
PHARMACY - ANTICOAGULATION CONSULT NOTE  Pharmacy Consult for Heparin Indication: pulmonary embolus  Allergies  Allergen Reactions   Penicillins Anaphylaxis and Shortness Of Breath   Amitriptyline Other (See Comments)    Yeast infection    Latex     Pt states it is ok to use briefly    Morphine And Codeine Other (See Comments)    AMS   Sulfa Antibiotics Other (See Comments)    Tongue and lip swelling   Patient Measurements: Heparin Dosing Weight: 65.8 kg  Vital Signs: Temp: 98.6 F (37 C) (01/20 1035) Temp Source: Oral (01/20 1035) BP: 147/64 (01/20 1330) Pulse Rate: 59 (01/20 1330)  Labs: Recent Labs    01/27/23 1049 01/27/23 1232  HGB 13.2  --   HCT 39.6  --   PLT 209  --   CREATININE 1.12*  --   TROPONINIHS 7 7   CrCl cannot be calculated (Unknown ideal weight.).  Medical History: Past Medical History:  Diagnosis Date   Cancer (HCC)    breast   Graves disease    Hypertension    Osteoporosis    Thyroid disease    Medications:  No history of anticoagulation per chart review  Baseline Labs: Hgb 13.2 Plt 209 INR and aPTT - pending   Assessment: Cynthia Raymond is a 89 yo women who presented with chest pain. Imaging was positive for nonocclusive thromboembolic disease. Pharmacy has been consulted to dose and manage this patient's heparin.   Goal of Therapy:  Heparin level 0.3-0.7 units/ml Monitor platelets by anticoagulation protocol: Yes   Plan:  Give 4000 units bolus x 1 Start heparin infusion at 1050 units/hr Check anti-Xa level in 8 hours and daily while on heparin Continue to monitor H&H and platelets Plan to transition to apixaban 1/21 per MD for PE  Effie Shy, PharmD Pharmacy Resident  01/27/2023 4:22 PM

## 2023-01-27 NOTE — Consult Note (Signed)
PHARMACY - ANTICOAGULATION CONSULT NOTE  Pharmacy Consult for Lovenox Indication: pulmonary embolus  Allergies  Allergen Reactions   Penicillins Anaphylaxis and Shortness Of Breath   Amitriptyline Other (See Comments)    Yeast infection    Latex     Pt states it is ok to use briefly    Morphine And Codeine Other (See Comments)    AMS   Sulfa Antibiotics Other (See Comments)    Tongue and lip swelling   Patient Measurements: Heparin Dosing Weight: 65.8 kg  Vital Signs: Temp: 98.6 F (37 C) (01/20 1035) Temp Source: Oral (01/20 1035) BP: 145/92 (01/20 1500) Pulse Rate: 58 (01/20 1600)  Labs: Recent Labs    01/27/23 1049 01/27/23 1232 01/27/23 1559  HGB 13.2  --   --   HCT 39.6  --   --   PLT 209  --   --   APTT  --   --  24  LABPROT  --   --  13.7  INR  --   --  1.0  CREATININE 1.12*  --   --   TROPONINIHS 7 7  --    Estimated Creatinine Clearance: 32 mL/min (A) (by C-G formula based on SCr of 1.12 mg/dL (H)).  Medical History: Past Medical History:  Diagnosis Date   Cancer (HCC)    breast   Graves disease    Hypertension    Osteoporosis    Thyroid disease    Medications:  No history of anticoagulation per chart review  Baseline Labs: Hgb 13.2 Plt 209 INR 1.0 aPTT 24  CrCl 32 mL/min  Assessment: SW is a 89 yo women who presented with chest pain. Imaging was positive for nonocclusive thromboembolic disease. Pharmacy has been consulted to dose and manage this patient's heparin.   Goal of Therapy:  Heparin level 0.3-0.7 units/ml Monitor platelets by anticoagulation protocol: Yes   Plan:  Started 1mg /kg Lovenox with plan for PE treatment per discussion with MD plan to transition to apixaban 1/21  Effie Shy, PharmD Pharmacy Resident  01/27/2023 5:08 PM

## 2023-01-28 ENCOUNTER — Other Ambulatory Visit (HOSPITAL_COMMUNITY): Payer: Self-pay

## 2023-01-28 ENCOUNTER — Observation Stay
Admit: 2023-01-28 | Discharge: 2023-01-28 | Disposition: A | Discharge status: Home / Self Care | Payer: Medicare HMO | Attending: Internal Medicine | Admitting: Internal Medicine

## 2023-01-28 ENCOUNTER — Telehealth (HOSPITAL_COMMUNITY): Payer: Self-pay

## 2023-01-28 ENCOUNTER — Other Ambulatory Visit: Payer: Self-pay

## 2023-01-28 DIAGNOSIS — I10 Essential (primary) hypertension: Secondary | ICD-10-CM | POA: Diagnosis not present

## 2023-01-28 DIAGNOSIS — I2699 Other pulmonary embolism without acute cor pulmonale: Secondary | ICD-10-CM | POA: Diagnosis not present

## 2023-01-28 DIAGNOSIS — E039 Hypothyroidism, unspecified: Secondary | ICD-10-CM

## 2023-01-28 DIAGNOSIS — F32A Depression, unspecified: Secondary | ICD-10-CM

## 2023-01-28 DIAGNOSIS — F3289 Other specified depressive episodes: Secondary | ICD-10-CM

## 2023-01-28 LAB — ECHOCARDIOGRAM COMPLETE
AR max vel: 1.95 cm2
AV Area VTI: 2.2 cm2
AV Area mean vel: 1.87 cm2
AV Mean grad: 5 mm[Hg]
AV Peak grad: 10.4 mm[Hg]
Ao pk vel: 1.61 m/s
Area-P 1/2: 4.01 cm2
Height: 60 in
MV VTI: 2.88 cm2
S' Lateral: 2.4 cm
Weight: 3051.17 [oz_av]

## 2023-01-28 LAB — CBC
HCT: 40.2 % (ref 36.0–46.0)
Hemoglobin: 13.4 g/dL (ref 12.0–15.0)
MCH: 32.3 pg (ref 26.0–34.0)
MCHC: 33.3 g/dL (ref 30.0–36.0)
MCV: 96.9 fL (ref 80.0–100.0)
Platelets: 207 10*3/uL (ref 150–400)
RBC: 4.15 MIL/uL (ref 3.87–5.11)
RDW: 13.2 % (ref 11.5–15.5)
WBC: 5.6 10*3/uL (ref 4.0–10.5)
nRBC: 0 % (ref 0.0–0.2)

## 2023-01-28 MED ORDER — VENLAFAXINE HCL ER 75 MG PO CP24
225.0000 mg | ORAL_CAPSULE | Freq: Every day | ORAL | 0 refills | Status: DC
  Filled 2023-01-28: qty 90, 30d supply, fill #0

## 2023-01-28 MED ORDER — APIXABAN (ELIQUIS) VTE STARTER PACK (10MG AND 5MG)
ORAL_TABLET | ORAL | 0 refills | Status: DC
  Filled 2023-01-28: qty 74, 30d supply, fill #0

## 2023-01-28 MED ORDER — HYDRALAZINE HCL 20 MG/ML IJ SOLN: 10.0000 mg | Freq: Four times a day (QID) | INTRAMUSCULAR | Status: DC | PRN

## 2023-01-28 NOTE — Discharge Instructions (Signed)
Information on my medicine - ELIQUIS (apixaban)  This medication education was reviewed with me or my healthcare representative as part of my discharge preparation.   Why was Eliquis prescribed for you? Eliquis was prescribed to treat blood clots that may have been found in the veins of your legs (deep vein thrombosis) or in your lungs (pulmonary embolism) and to reduce the risk of them occurring again.  What do You need to know about Eliquis ? The starting dose is 10 mg (two 5 mg tablets) taken TWICE daily for the FIRST SEVEN (7) DAYS, then the dose is reduced to ONE 5 mg tablet taken TWICE daily.  Eliquis may be taken with or without food.   Try to take the dose about the same time in the morning and in the evening. If you have difficulty swallowing the tablet whole please discuss with your pharmacist how to take the medication safely.  Take Eliquis exactly as prescribed and DO NOT stop taking Eliquis without talking to the doctor who prescribed the medication.  Stopping may increase your risk of developing a new blood clot.  Refill your prescription before you run out.  After discharge, you should have regular check-up appointments with your healthcare provider that is prescribing your Eliquis.    What do you do if you miss a dose? If a dose of ELIQUIS is not taken at the scheduled time, take it as soon as possible on the same day and twice-daily administration should be resumed. The dose should not be doubled to make up for a missed dose.  Important Safety Information A possible side effect of Eliquis is bleeding. You should call your healthcare provider right away if you experience any of the following: Bleeding from an injury or your nose that does not stop. Unusual colored urine (red or dark brown) or unusual colored stools (red or black). Unusual bruising for unknown reasons. A serious fall or if you hit your head (even if there is no bleeding).  Some medicines may  interact with Eliquis and might increase your risk of bleeding or clotting while on Eliquis. To help avoid this, consult your healthcare provider or pharmacist prior to using any new prescription or non-prescription medications, including herbals, vitamins, non-steroidal anti-inflammatory drugs (NSAIDs) and supplements.  This website has more information on Eliquis (apixaban): http://www.eliquis.com/eliquis/home  

## 2023-01-28 NOTE — TOC CM/SW Note (Signed)
Transition of Care [facility]) - Inpatient Brief Assessment   Patient Details  Name: Cynthia Raymond MRN: [id number] Date of Birth: [birth year]  Transition of [facility]) CM/SW Contact:    Chapman Fitch, RN Phone Number: 01/28/2023, 1:17 PM   Clinical Narrative:    Transition of Care [facility]) Screening Note   Patient Details  Name: Cynthia Raymond Date of Birth: 05/23/1929   Transition of Care [facility]) CM/SW Contact:    Chapman Fitch, RN Phone Number: 01/28/2023, 1:17 PM    Transition of Care Department [facility]) has reviewed patient and no TOC needs have been identified at this time. If new patient transition needs arise, please place a TOC consult.   Transition of Care Asessment: Insurance and Status: Insurance coverage has been reviewed Patient has primary care physician: Yes     Prior/Current Home Services: No current home services Social Drivers of Health Review: SDOH reviewed no interventions necessary Readmission risk has been reviewed: Yes Transition of care needs: no transition of care needs at this time

## 2023-01-28 NOTE — Plan of Care (Signed)

## 2023-01-28 NOTE — Plan of Care (Signed)

## 2023-01-28 NOTE — Telephone Encounter (Signed)
Pharmacy Patient Advocate Encounter  Insurance verification completed.    The patient is insured through [employer]. Patient has Medicare and is not eligible for a copay card, but may be able to apply for patient assistance or Medicare RX Payment Plan (Patient Must reach out to their plan, if eligible for payment plan), if available.    Ran test claim for Eliquis Starter Pack and the current 30 day co-pay is $188.36.   This test claim was processed through [facility]- copay amounts may vary at other pharmacies due to pharmacy/plan contracts, or as the patient moves through the different stages of their insurance plan.

## 2023-01-28 NOTE — Progress Notes (Signed)
1/21 I spoke to the patient to present the information contained in the MOON Letter, the patient began to exhibit some confusion which prompted me to call the daughter Skeet Simmer) @ [phone]. I discussed the content of the MOON Letter with her and received verbal consent and acknowledgement.

## 2023-01-28 NOTE — Care Management Obs Status (Signed)
MEDICARE OBSERVATION STATUS NOTIFICATION   Patient Details  Name: Cynthia Raymond MRN: [id number] Date of Birth: 1929/06/20   Medicare Observation Status Notification Given:  Yes    Sherilyn Banker 01/28/2023, 3:25 PM

## 2023-01-28 NOTE — Progress Notes (Signed)
*  PRELIMINARY RESULTS* Echocardiogram 2D Echocardiogram has been performed.  Carolyne Fiscal 01/28/2023, 1:54 PM

## 2023-01-29 ENCOUNTER — Other Ambulatory Visit: Payer: Self-pay

## 2023-01-29 LAB — THYROID PANEL WITH TSH
Free Thyroxine Index: 2.4 (ref 1.2–4.9)
T3 Uptake Ratio: 33 % (ref 24–39)
T4, Total: 7.3 ug/dL (ref 4.5–12.0)
TSH: 1.78 u[IU]/mL (ref 0.450–4.500)

## 2023-02-07 NOTE — Discharge Summary (Signed)
Physician Discharge Summary   Patient: Cynthia Raymond MRN: [id number] DOB: [birth year]  Admit date:     01/27/2023  Discharge date: 01/28/2023  Discharge Physician: Delfino Lovett   PCP: [name], MD   Recommendations at discharge:    F/up with outpt providers as requested  Discharge Diagnoses: Principal Problem:   PE (pulmonary thromboembolism) (HCC) Active Problems:   Primary hypertension   Acquired hypothyroidism   Depression  Hospital Course: Assessment and Plan:  89 y.o. female with medical history significant of HTN, HLD, A. fib, COPD, CAD, breast cancer, hypothyroidism, chronic pain admitted for new onset PE   Pulmonary embolism Confirmed on CT angio chest on admission Start on full dose anticoagulation/Lovenox and transitioned to Eliquis at discharge  No right heart strain, normal 2D echo   Hypothyroidism Continue levothyroxine   Essential hypertension Continue home meds  Acute intractable tension-type headache Likely due to nitroglycerin given in the ED and resolved            Disposition: Home Diet recommendation:  Discharge Diet Orders (From admission, onward)     Start     Ordered   01/28/23 0000  Diet - low sodium heart healthy        01/28/23 1512           Carb modified diet DISCHARGE MEDICATION: Allergies as of 01/28/2023       Reactions   Penicillins Anaphylaxis, Shortness Of Breath   Gabapentin Other (See Comments), Nausea And Vomiting   Morphine And Codeine Other (See Comments)   AMS   Sulfa Antibiotics Other (See Comments)   Tongue and lip swelling   Amitriptyline Other (See Comments)   Yeast infection   Buspirone Other (See Comments)   Increased anxiety, agitation   Latex Rash   Pt states it is ok to use briefly   Morphine Other (See Comments)   Headache        Medication List     STOP taking these medications    traMADol 50 MG tablet Commonly known as: ULTRAM       TAKE these medications     acetaminophen 500 MG tablet Commonly known as: TYLENOL Take 1,000 mg by mouth every 8 (eight) hours as needed.   ascorbic acid 500 MG tablet Commonly known as: VITAMIN C Take 500 mg by mouth daily.   aspirin 81 MG tablet Take 81 mg by mouth daily as needed for pain.   Eliquis DVT/PE Starter Pack Generic drug: Apixaban Starter Pack (10mg  and 5mg ) Follow instructions on package. Take 2 tablets (10 mg) by mouth 2 (two) times daily for 7 days, THEN 1 tablet (5 mg) 2 (two) times daily. Start taking on: January 28, 2023   levothyroxine 112 MCG tablet Commonly known as: SYNTHROID Take 112 mcg by mouth daily before breakfast.   Melatonin 3 MG Caps Take 2 tablets by mouth daily as needed.   pantoprazole 40 MG tablet Commonly known as: PROTONIX Take 40 mg by mouth every morning.   ramipril 10 MG capsule Commonly known as: ALTACE Take 10 mg by mouth daily.   traZODone 100 MG tablet Commonly known as: DESYREL Take 150 mg by mouth at bedtime.   venlafaxine XR 75 MG 24 hr capsule Commonly known as: EFFEXOR-XR Take 3 capsules (225 mg total) by mouth daily with breakfast. What changed:  medication strength how much to take Another medication with the same name was removed. Continue taking this medication, and follow the directions you see here.  VITAMIN B 12 PO Take 1 tablet by mouth daily.   Vitamin D (Ergocalciferol) 1.25 MG (50000 UNIT) Caps capsule Commonly known as: DRISDOL Take 50,000 Units by mouth every 30 (thirty) days.        Follow-up Information     [name], MD. Schedule an appointment as soon as possible for a visit in 1 week(s).   Specialty: Family Medicine Why: [facility] Discharge F/UP Contact information: [address] [facility] [place] [address] [phone]         Lonell Face, MD. Schedule an appointment as soon as possible for a visit in 2 week(s).   Specialty: Neurology Why: [facility] Discharge  F/UP Contact information: [address] [facility] [place] [address] [phone]                Discharge Exam: Cynthia Raymond Weights   01/27/23 1600  Weight: 76.72 kg   89 year old female lying in the bed comfortably without any acute distress Lungs clear to auscultation bilaterally Heart regular rate and rhythm Abdomen soft, benign Neuro alert and awake, nonfocal Skin no rash or lesion Psych normal mood and affect  Condition at discharge: fair  The results of significant diagnostics from this hospitalization (including imaging, microbiology, ancillary and laboratory) are listed below for reference.   Imaging Studies: ECHOCARDIOGRAM COMPLETE Result Date: 01/28/2023    ECHOCARDIOGRAM REPORT   Patient Name:   [facility] Date of Exam: 01/28/2023 Medical Rec #:  [id number]            Height:       60.0 in Accession #:    [id number]           Weight:       190.7 lb Date of Birth:  09-29-1929            BSA:          1.829 m Patient Age:    89 years             BP:           185/57 mmHg Patient Gender: F                    HR:           72 bpm. Exam Location:  [facility] Procedure: 2D Echo, Cardiac Doppler and Color Doppler Indications:     Dyspnea  History:         Patient has no prior history of Echocardiogram examinations.                  Signs/Symptoms:Dyspnea. CKD.  Sonographer:     Cynthia Raymond Referring Phys:  [id number] Cynthia Raymond [facility] Diagnosing Phys: Cynthia Raymond  Sonographer Comments: Image acquisition challenging due to respiratory motion. IMPRESSIONS  1. Left ventricular ejection fraction, by estimation, is 55 to 60%. The left ventricle has normal function. The left ventricle has no regional wall motion abnormalities. Left ventricular diastolic parameters were normal.  2. Right ventricular systolic function is normal. The right ventricular size is normal.  3. The mitral valve is normal in structure. Trivial mitral valve regurgitation. No evidence of  mitral stenosis.  4. The aortic valve is normal in structure. Aortic valve regurgitation is not visualized. Aortic valve sclerosis/calcification is present, without any evidence of aortic stenosis. FINDINGS  Left Ventricle: Left ventricular ejection fraction, by estimation, is 55 to 60%. The left ventricle has normal function. The left ventricle has no  regional wall motion abnormalities. The left ventricular internal cavity size was normal in size. There is  no left ventricular hypertrophy. Left ventricular diastolic parameters were normal. Right Ventricle: The right ventricular size is normal. No increase in right ventricular wall thickness. Right ventricular systolic function is normal. Left Atrium: Left atrial size was normal in size. Right Atrium: Right atrial size was normal in size. Pericardium: There is no evidence of pericardial effusion. Mitral Valve: The mitral valve is normal in structure. Trivial mitral valve regurgitation. No evidence of mitral valve stenosis. MV peak gradient, 3.7 mmHg. The mean mitral valve gradient is 1.0 mmHg. Tricuspid Valve: The tricuspid valve is normal in structure. Tricuspid valve regurgitation is trivial. Aortic Valve: The aortic valve is normal in structure. Aortic valve regurgitation is not visualized. Aortic valve sclerosis/calcification is present, without any evidence of aortic stenosis. Aortic valve mean gradient measures 5.0 mmHg. Aortic valve peak  gradient measures 10.4 mmHg. Aortic valve area, by VTI measures 2.20 cm. Pulmonic Valve: The pulmonic valve was grossly normal. Pulmonic valve regurgitation is not visualized. Aorta: The aortic root is normal in size and structure. IAS/Shunts: No atrial level shunt detected by color flow Doppler.  LEFT VENTRICLE PLAX 2D LVIDd:         4.40 cm LVIDs:         2.40 cm LV PW:         0.90 cm LV IVS:        1.30 cm LVOT diam:     2.00 cm LV SV:         74 LV SV Index:   41 LVOT Area:     3.14 cm  RIGHT VENTRICLE RV Basal diam:   3.80 cm RV Mid diam:    3.20 cm LEFT ATRIUM             Index        RIGHT ATRIUM           Index LA diam:        4.00 cm 2.19 cm/m   RA Area:     15.30 cm LA Vol (A2C):   41.1 ml 22.47 ml/m  RA Volume:   36.10 ml  19.74 ml/m LA Vol (A4C):   46.8 ml 25.58 ml/m LA Biplane Vol: 45.3 ml 24.76 ml/m  AORTIC VALVE                     PULMONIC VALVE AV Area (Vmax):    1.95 cm      PV Vmax:       1.48 m/s AV Area (Vmean):   1.87 cm      PV Peak grad:  8.8 mmHg AV Area (VTI):     2.20 cm AV Vmax:           161.00 cm/s AV Vmean:          106.000 cm/s AV VTI:            0.339 m AV Peak Grad:      10.4 mmHg AV Mean Grad:      5.0 mmHg LVOT Vmax:         99.80 cm/s LVOT Vmean:        63.000 cm/s LVOT VTI:          0.237 m LVOT/AV VTI ratio: 0.70  AORTA Ao Root diam: 3.30 cm MITRAL VALVE MV Area (PHT): 4.01 cm    SHUNTS MV Area VTI:   2.88 cm  Systemic VTI:  0.24 m MV Peak grad:  3.7 mmHg    Systemic Diam: 2.00 cm MV Mean grad:  1.0 mmHg MV Vmax:       0.96 m/s MV Vmean:      48.0 cm/s MV Decel Time: 189 msec MV E velocity: 60.60 cm/s MV A velocity: 99.90 cm/s MV E/A ratio:  0.61 Cynthia Raymond Electronically signed by Clotilde Dieter Signature Date/Time: 01/28/2023/3:07:48 PM    Final    CT Angio Chest PE W and/or Wo Contrast Result Date: 01/27/2023 CLINICAL DATA:  Chest pain and shortness of breath. Concern for acute pulmonary embolism. Right upper quadrant abdominal pain. EXAM: CT ANGIOGRAPHY CHEST CT ABDOMEN AND PELVIS WITH CONTRAST TECHNIQUE: Multidetector CT imaging of the chest was performed using the standard protocol during bolus administration of intravenous contrast. Multiplanar CT image reconstructions and MIPs were obtained to evaluate the vascular anatomy. Multidetector CT imaging of the abdomen and pelvis was performed using the standard protocol during bolus administration of intravenous contrast. RADIATION DOSE REDUCTION: This exam was performed according to the departmental dose-optimization  program which includes automated exposure control, adjustment of the mA and/or kV according to patient size and/or use of iterative reconstruction technique. CONTRAST:  OMNIPAQUE IOHEXOL 350 MG/ML SOLN COMPARISON:  Chest CTA 06/25/2021.  Abdominopelvic CT 12/20/2021. FINDINGS: CTA CHEST FINDINGS Cardiovascular: The pulmonary arteries are well opacified with contrast to the level of the segmental branches. Study is positive for nonocclusive thromboembolic disease involving the lobar, segmental and subsegmental branches of the right lower lobe. No acute left-sided pulmonary emboli are demonstrated. Atherosclerosis of the aorta, great vessels and coronary arteries without evidence of aneurysm or acute to stomach abnormality. The heart is mildly enlarged without evidence of pericardial effusion or right heart strain. Mediastinum/Nodes: There are no enlarged mediastinal, hilar or axillary lymph nodes. The thyroid gland, trachea and esophagus demonstrate no significant findings. Lungs/Pleura: No pleural effusion or pneumothorax. Mild central airway thickening and mild mosaic attenuation to the lungs. No confluent airspace disease or suspicious pulmonary nodule. Musculoskeletal/Chest wall: No chest wall mass or suspicious osseous findings. Multilevel spondylosis. Previous right shoulder reverse arthroplasty. Moderate glenohumeral degenerative changes on the left. CT ABDOMEN AND PELVIS FINDINGS Hepatobiliary: The liver is normal in density without suspicious focal abnormality. No significant biliary dilatation status post cholecystectomy. Pancreas: Unremarkable. No pancreatic ductal dilatation or surrounding inflammatory changes. Spleen: Small cystic appearing lesions in the spleen are not well seen on previous noncontrast studies. No aggressive splenic lesions or splenomegaly. Adrenals/Urinary Tract: Both adrenal glands appear normal. No evidence of urinary tract calculus, suspicious renal lesion or hydronephrosis.  The bladder is partially obscured by artifact from the right hip arthroplasty. No bladder abnormality identified. Stomach/Bowel: No enteric contrast administered. The stomach appears unremarkable for its degree of distension. No evidence of bowel wall thickening, distention or surrounding inflammatory change. The appendix appears normal. Mildly prominent stool in the proximal colon. Mild descending and sigmoid diverticulosis. Vascular/Lymphatic: There are no enlarged abdominal or pelvic lymph nodes. Aortic and branch vessel atherosclerosis with stable mild dilatation of the infrarenal abdominal aorta, but no focal aneurysm. No evidence of large vessel occlusion. Reproductive: Status post hysterectomy. No evidence of adnexal mass. Other: No evidence of abdominal wall mass or hernia. No ascites or pneumoperitoneum. Musculoskeletal: No acute or significant osseous findings. Multilevel spondylosis status post L3-4 PLIF and right total hip arthroplasty. Review of the MIP images confirms the above findings. IMPRESSION: 1. Study is positive for nonocclusive thromboembolic disease involving the lobar, segmental  and subsegmental branches of the right lower lobe. No evidence of right heart strain. 2. No other acute findings in the chest, abdomen or pelvis. 3. Small cystic appearing lesions in the spleen, not well seen on previous noncontrast studies, likely benign. 4.  Aortic Atherosclerosis (ICD10-I70.0). 5. Critical Value/emergent results were called by telephone at the time of interpretation on 01/27/2023 at 3:10 pm to provider Cynthia Raymond, who verbally acknowledged these results. Electronically Signed   By: Carey Bullocks M.D.   On: 01/27/2023 15:12   CT ABDOMEN PELVIS W CONTRAST Result Date: 01/27/2023 CLINICAL DATA:  Chest pain and shortness of breath. Concern for acute pulmonary embolism. Right upper quadrant abdominal pain. EXAM: CT ANGIOGRAPHY CHEST CT ABDOMEN AND PELVIS WITH CONTRAST TECHNIQUE:  Multidetector CT imaging of the chest was performed using the standard protocol during bolus administration of intravenous contrast. Multiplanar CT image reconstructions and MIPs were obtained to evaluate the vascular anatomy. Multidetector CT imaging of the abdomen and pelvis was performed using the standard protocol during bolus administration of intravenous contrast. RADIATION DOSE REDUCTION: This exam was performed according to the departmental dose-optimization program which includes automated exposure control, adjustment of the mA and/or kV according to patient size and/or use of iterative reconstruction technique. CONTRAST:  OMNIPAQUE IOHEXOL 350 MG/ML SOLN COMPARISON:  Chest CTA 06/25/2021.  Abdominopelvic CT 12/20/2021. FINDINGS: CTA CHEST FINDINGS Cardiovascular: The pulmonary arteries are well opacified with contrast to the level of the segmental branches. Study is positive for nonocclusive thromboembolic disease involving the lobar, segmental and subsegmental branches of the right lower lobe. No acute left-sided pulmonary emboli are demonstrated. Atherosclerosis of the aorta, great vessels and coronary arteries without evidence of aneurysm or acute to stomach abnormality. The heart is mildly enlarged without evidence of pericardial effusion or right heart strain. Mediastinum/Nodes: There are no enlarged mediastinal, hilar or axillary lymph nodes. The thyroid gland, trachea and esophagus demonstrate no significant findings. Lungs/Pleura: No pleural effusion or pneumothorax. Mild central airway thickening and mild mosaic attenuation to the lungs. No confluent airspace disease or suspicious pulmonary nodule. Musculoskeletal/Chest wall: No chest wall mass or suspicious osseous findings. Multilevel spondylosis. Previous right shoulder reverse arthroplasty. Moderate glenohumeral degenerative changes on the left. CT ABDOMEN AND PELVIS FINDINGS Hepatobiliary: The liver is normal in density without  suspicious focal abnormality. No significant biliary dilatation status post cholecystectomy. Pancreas: Unremarkable. No pancreatic ductal dilatation or surrounding inflammatory changes. Spleen: Small cystic appearing lesions in the spleen are not well seen on previous noncontrast studies. No aggressive splenic lesions or splenomegaly. Adrenals/Urinary Tract: Both adrenal glands appear normal. No evidence of urinary tract calculus, suspicious renal lesion or hydronephrosis. The bladder is partially obscured by artifact from the right hip arthroplasty. No bladder abnormality identified. Stomach/Bowel: No enteric contrast administered. The stomach appears unremarkable for its degree of distension. No evidence of bowel wall thickening, distention or surrounding inflammatory change. The appendix appears normal. Mildly prominent stool in the proximal colon. Mild descending and sigmoid diverticulosis. Vascular/Lymphatic: There are no enlarged abdominal or pelvic lymph nodes. Aortic and branch vessel atherosclerosis with stable mild dilatation of the infrarenal abdominal aorta, but no focal aneurysm. No evidence of large vessel occlusion. Reproductive: Status post hysterectomy. No evidence of adnexal mass. Other: No evidence of abdominal wall mass or hernia. No ascites or pneumoperitoneum. Musculoskeletal: No acute or significant osseous findings. Multilevel spondylosis status post L3-4 PLIF and right total hip arthroplasty. Review of the MIP images confirms the above findings. IMPRESSION: 1. Study is positive for nonocclusive  thromboembolic disease involving the lobar, segmental and subsegmental branches of the right lower lobe. No evidence of right heart strain. 2. No other acute findings in the chest, abdomen or pelvis. 3. Small cystic appearing lesions in the spleen, not well seen on previous noncontrast studies, likely benign. 4.  Aortic Atherosclerosis (ICD10-I70.0). 5. Critical Value/emergent results were called by  telephone at the time of interpretation on 01/27/2023 at 3:10 pm to provider Cynthia Raymond, who verbally acknowledged these results. Electronically Signed   By: Carey Bullocks M.D.   On: 01/27/2023 15:12   DG Chest 2 View Result Date: 01/27/2023 CLINICAL DATA:  Acute onset chest pain EXAM: CHEST - 2 VIEW COMPARISON:  Chest radiograph dated 06/25/2021 FINDINGS: Normal lung volumes. No focal consolidations. No pleural effusion or pneumothorax. The heart size and mediastinal contours are within normal limits. Right shoulder arthroplasty and left humeral bone anchors. Surgical clips project over the left axilla and breast. IMPRESSION: No active cardiopulmonary disease. Electronically Signed   By: Agustin Cree M.D.   On: 01/27/2023 11:32   CT Lumbar Spine Wo Contrast Result Date: 01/09/2023 CLINICAL DATA:  Low back pain, increased fracture risk. Lower back pain radiating into the left leg. Worse with ambulation. EXAM: CT LUMBAR SPINE WITHOUT CONTRAST TECHNIQUE: Multidetector CT imaging of the lumbar spine was performed without intravenous contrast administration. Multiplanar CT image reconstructions were also generated. RADIATION DOSE REDUCTION: This exam was performed according to the departmental dose-optimization program which includes automated exposure control, adjustment of the mA and/or kV according to patient size and/or use of iterative reconstruction technique. COMPARISON:  Lumbar spine CT 05/09/2022. FINDINGS: Segmentation: Conventional numbering is assumed with 5 non-rib-bearing, lumbar type vertebral bodies. Alignment: Unchanged grade 1 anterolisthesis of L4 on L5. Vertebrae: Unchanged postoperative appearance from prior L3-4 interbody and posterior spinal fusion. Hardware is intact. No associated lucency. Solid bony fusion across the intervening disc space. Prior L3-S1 laminectomy. No acute fracture or suspicious bone lesion. Multilevel degenerative endplate changes. Paraspinal and other soft tissues:  Atherosclerotic calcifications of the abdominal aorta and its branches. Sigmoid diverticulosis. Disc levels: T12-L1: Disc bulge and facet arthropathy results in mild-to-moderate spinal canal stenosis and moderate bilateral neural foraminal narrowing. L1-L2: Small disc bulge and moderate bilateral facet arthropathy without significant spinal canal stenosis. Severe right neural foraminal narrowing. L2-L3: Left eccentric disc bulge and facet arthropathy results in moderate left and severe right neural foraminal narrowing. L3-L4: Prior laminectomy, interbody and posterior spinal fusion. No spinal canal stenosis or neural foraminal narrowing. L4-L5: Prior laminectomy. Anterolisthesis with uncovered disc and facet arthropathy results in moderate narrowing of the bilateral subarticular zones, severe left and moderate right neural foraminal narrowing. L5-S1: Prior laminectomy. No spinal canal stenosis or neural foraminal narrowing. IMPRESSION: 1. No acute fracture or traumatic malalignment of the lumbar spine. 2. Unchanged postoperative appearance from prior L3-4 interbody and posterior spinal fusion. No evidence of hardware complication. 3. Unchanged grade 1 anterolisthesis of L4 on L5 with uncovered disc and facet arthropathy resulting in moderate narrowing of the bilateral subarticular zones, severe left and moderate right neural foraminal narrowing. 4. Severe right neural foraminal narrowing at L1-2 and L2-3. Aortic Atherosclerosis (ICD10-I70.0). Electronically Signed   By: Orvan Falconer M.D.   On: 01/09/2023 16:03    Microbiology: Results for orders placed or performed during the hospital encounter of 01/27/23  Resp panel by RT-PCR (RSV, Flu A&B, Covid) Anterior Nasal Swab     Status: None   Collection Time: 01/27/23 11:24 AM   Specimen: Anterior  Nasal Swab  Result Value Ref Range Status   SARS Coronavirus 2 by RT PCR NEGATIVE NEGATIVE Final    Comment: (NOTE) SARS-CoV-2 target nucleic acids are NOT  DETECTED.  The SARS-CoV-2 RNA is generally detectable in upper respiratory specimens during the acute phase of infection. The lowest concentration of SARS-CoV-2 viral copies this assay can detect is 138 copies/mL. A negative result does not preclude SARS-Cov-2 infection and should not be used as the sole basis for treatment or other patient management decisions. A negative result may occur with  improper specimen collection/handling, submission of specimen other than nasopharyngeal swab, presence of viral mutation(s) within the areas targeted by this assay, and inadequate number of viral copies(<138 copies/mL). A negative result must be combined with clinical observations, patient history, and epidemiological information. The expected result is Negative.  Fact Sheet for Patients:  BloggerCourse.com  Fact Sheet for Healthcare Providers:  SeriousBroker.it  This test is no t yet approved or cleared by the [place] FDA and  has been authorized for detection and/or diagnosis of SARS-CoV-2 by FDA under an Emergency Use Authorization (EUA). This EUA will remain  in effect (meaning this test can be used) for the duration of the COVID-19 declaration under Section 564(b)(1) of the Act, 21 U.S.C.section 360bbb-3(b)(1), unless the authorization is terminated  or revoked sooner.       Influenza A by PCR NEGATIVE NEGATIVE Final   Influenza B by PCR NEGATIVE NEGATIVE Final    Comment: (NOTE) The Xpert Xpress SARS-CoV-2/FLU/RSV plus assay is intended as an aid in the diagnosis of influenza from Nasopharyngeal swab specimens and should not be used as a sole basis for treatment. Nasal washings and aspirates are unacceptable for Xpert Xpress SARS-CoV-2/FLU/RSV testing.  Fact Sheet for Patients: BloggerCourse.com  Fact Sheet for Healthcare Providers: SeriousBroker.it  This test is not yet  approved or cleared by the [place] FDA and has been authorized for detection and/or diagnosis of SARS-CoV-2 by FDA under an Emergency Use Authorization (EUA). This EUA will remain in effect (meaning this test can be used) for the duration of the COVID-19 declaration under Section 564(b)(1) of the Act, 21 U.S.C. section 360bbb-3(b)(1), unless the authorization is terminated or revoked.     Resp Syncytial Virus by PCR NEGATIVE NEGATIVE Final    Comment: (NOTE) Fact Sheet for Patients: BloggerCourse.com  Fact Sheet for Healthcare Providers: SeriousBroker.it  This test is not yet approved or cleared by the [place] FDA and has been authorized for detection and/or diagnosis of SARS-CoV-2 by FDA under an Emergency Use Authorization (EUA). This EUA will remain in effect (meaning this test can be used) for the duration of the COVID-19 declaration under Section 564(b)(1) of the Act, 21 U.S.C. section 360bbb-3(b)(1), unless the authorization is terminated or revoked.  Performed at [facility], [address] [place], [place] [address]     Labs: CBC: No results for input(s): "WBC", "NEUTROABS", "HGB", "HCT", "MCV", "PLT" in the last 168 hours. Basic Metabolic Panel: No results for input(s): "NA", "K", "CL", "CO2", "GLUCOSE", "BUN", "CREATININE", "CALCIUM", "MG", "PHOS" in the last 168 hours. Liver Function Tests: No results for input(s): "AST", "ALT", "ALKPHOS", "BILITOT", "PROT", "ALBUMIN" in the last 168 hours. CBG: No results for input(s): "GLUCAP" in the last 168 hours.  Discharge time spent: greater than 30 minutes.  Signed: Delfino Lovett, MD [facility] 02/07/2023

## 2023-04-10 ENCOUNTER — Other Ambulatory Visit: Payer: Self-pay | Admitting: Cardiology

## 2023-04-10 DIAGNOSIS — R600 Localized edema: Secondary | ICD-10-CM

## 2023-04-11 ENCOUNTER — Ambulatory Visit
Admission: RE | Admit: 2023-04-11 | Discharge: 2023-04-11 | Disposition: A | Discharge status: Home / Self Care | Source: Ambulatory Visit | Attending: Cardiology | Admitting: Cardiology

## 2023-04-11 DIAGNOSIS — R600 Localized edema: Secondary | ICD-10-CM | POA: Insufficient documentation

## 2023-04-22 ENCOUNTER — Ambulatory Visit: Admitting: Student in an Organized Health Care Education/Training Program

## 2023-07-21 ENCOUNTER — Encounter: Payer: Self-pay | Admitting: Physician Assistant

## 2023-07-21 DIAGNOSIS — N644 Mastodynia: Secondary | ICD-10-CM

## 2023-07-22 ENCOUNTER — Other Ambulatory Visit: Payer: Self-pay | Admitting: Physician Assistant

## 2023-07-22 DIAGNOSIS — N644 Mastodynia: Secondary | ICD-10-CM

## 2023-07-22 DIAGNOSIS — Z1231 Encounter for screening mammogram for malignant neoplasm of breast: Secondary | ICD-10-CM

## 2023-07-23 ENCOUNTER — Other Ambulatory Visit: Payer: Self-pay | Admitting: *Deleted

## 2023-07-23 ENCOUNTER — Inpatient Hospital Stay
Admission: RE | Admit: 2023-07-23 | Discharge: 2023-07-23 | Disposition: A | Discharge status: Home / Self Care | Payer: Self-pay | Source: Ambulatory Visit | Attending: Family Medicine | Admitting: Family Medicine

## 2023-07-23 DIAGNOSIS — Z1231 Encounter for screening mammogram for malignant neoplasm of breast: Secondary | ICD-10-CM

## 2023-07-29 ENCOUNTER — Inpatient Hospital Stay
Admission: RE | Admit: 2023-07-29 | Discharge: 2023-07-29 | Discharge status: Home / Self Care | Source: Ambulatory Visit | Attending: Physician Assistant

## 2023-07-29 ENCOUNTER — Ambulatory Visit
Admission: RE | Admit: 2023-07-29 | Discharge: 2023-07-29 | Disposition: A | Discharge status: Home / Self Care | Source: Ambulatory Visit | Attending: Physician Assistant | Admitting: Physician Assistant

## 2023-07-29 DIAGNOSIS — N644 Mastodynia: Secondary | ICD-10-CM

## 2023-07-29 DIAGNOSIS — Z1231 Encounter for screening mammogram for malignant neoplasm of breast: Secondary | ICD-10-CM | POA: Diagnosis present

## 2023-07-30 ENCOUNTER — Other Ambulatory Visit: Payer: Self-pay | Admitting: Internal Medicine

## 2023-07-30 DIAGNOSIS — R928 Other abnormal and inconclusive findings on diagnostic imaging of breast: Secondary | ICD-10-CM

## 2023-08-05 ENCOUNTER — Ambulatory Visit
Admission: RE | Admit: 2023-08-05 | Discharge: 2023-08-05 | Disposition: A | Discharge status: Home / Self Care | Source: Ambulatory Visit | Attending: Internal Medicine | Admitting: Internal Medicine

## 2023-08-05 DIAGNOSIS — N6325 Unspecified lump in the left breast, overlapping quadrants: Secondary | ICD-10-CM | POA: Diagnosis not present

## 2023-08-05 DIAGNOSIS — R928 Other abnormal and inconclusive findings on diagnostic imaging of breast: Secondary | ICD-10-CM | POA: Insufficient documentation

## 2023-08-05 HISTORY — PX: BREAST BIOPSY: SHX20

## 2023-08-05 MED ORDER — LIDOCAINE 1 % OPTIME INJ - NO CHARGE
2.0000 mL | Freq: Once | INTRAMUSCULAR | Status: AC
  Administered 2023-08-05: 2 mL
  Filled 2023-08-05: qty 2

## 2023-08-05 MED ORDER — LIDOCAINE-EPINEPHRINE 1 %-1:100000 IJ SOLN
8.0000 mL | Freq: Once | INTRAMUSCULAR | Status: AC
  Administered 2023-08-05: 8 mL
  Filled 2023-08-05: qty 8

## 2023-08-06 ENCOUNTER — Encounter: Payer: Self-pay | Admitting: *Deleted

## 2023-08-06 DIAGNOSIS — C50912 Malignant neoplasm of unspecified site of left female breast: Secondary | ICD-10-CM

## 2023-08-06 LAB — SURGICAL PATHOLOGY

## 2023-08-06 NOTE — Progress Notes (Signed)
 Received referral for newly diagnosed breast cancer from [facility].  Navigation initiated.  She will meet with Dr. Cesar tomorrow and Dr. Babara on Wednesday 8/6.

## 2023-08-13 ENCOUNTER — Inpatient Hospital Stay

## 2023-08-13 ENCOUNTER — Encounter: Payer: Self-pay | Admitting: Oncology

## 2023-08-13 ENCOUNTER — Inpatient Hospital Stay: Attending: Oncology | Admitting: Oncology

## 2023-08-13 ENCOUNTER — Encounter: Payer: Self-pay | Admitting: *Deleted

## 2023-08-13 VITALS — BP 142/70 | HR 67 | Temp 97.0°F | Resp 16

## 2023-08-13 DIAGNOSIS — I2699 Other pulmonary embolism without acute cor pulmonale: Secondary | ICD-10-CM | POA: Diagnosis not present

## 2023-08-13 DIAGNOSIS — Z1721 Progesterone receptor positive status: Secondary | ICD-10-CM | POA: Insufficient documentation

## 2023-08-13 DIAGNOSIS — Z885 Allergy status to narcotic agent status: Secondary | ICD-10-CM | POA: Diagnosis not present

## 2023-08-13 DIAGNOSIS — Z882 Allergy status to sulfonamides status: Secondary | ICD-10-CM | POA: Diagnosis not present

## 2023-08-13 DIAGNOSIS — M8580 Other specified disorders of bone density and structure, unspecified site: Secondary | ICD-10-CM | POA: Insufficient documentation

## 2023-08-13 DIAGNOSIS — Z87891 Personal history of nicotine dependence: Secondary | ICD-10-CM | POA: Diagnosis not present

## 2023-08-13 DIAGNOSIS — Z79899 Other long term (current) drug therapy: Secondary | ICD-10-CM | POA: Diagnosis not present

## 2023-08-13 DIAGNOSIS — Z170 Estrogen receptor positive status [ER+]: Secondary | ICD-10-CM | POA: Diagnosis not present

## 2023-08-13 DIAGNOSIS — Z79890 Hormone replacement therapy (postmenopausal): Secondary | ICD-10-CM | POA: Insufficient documentation

## 2023-08-13 DIAGNOSIS — R609 Edema, unspecified: Secondary | ICD-10-CM | POA: Insufficient documentation

## 2023-08-13 DIAGNOSIS — I130 Hypertensive heart and chronic kidney disease with heart failure and stage 1 through stage 4 chronic kidney disease, or unspecified chronic kidney disease: Secondary | ICD-10-CM | POA: Insufficient documentation

## 2023-08-13 DIAGNOSIS — Z7901 Long term (current) use of anticoagulants: Secondary | ICD-10-CM | POA: Insufficient documentation

## 2023-08-13 DIAGNOSIS — N1831 Chronic kidney disease, stage 3a: Secondary | ICD-10-CM | POA: Insufficient documentation

## 2023-08-13 DIAGNOSIS — Z1732 Human epidermal growth factor receptor 2 negative status: Secondary | ICD-10-CM | POA: Diagnosis not present

## 2023-08-13 DIAGNOSIS — I429 Cardiomyopathy, unspecified: Secondary | ICD-10-CM | POA: Diagnosis not present

## 2023-08-13 DIAGNOSIS — Z8249 Family history of ischemic heart disease and other diseases of the circulatory system: Secondary | ICD-10-CM | POA: Insufficient documentation

## 2023-08-13 DIAGNOSIS — Z880 Allergy status to penicillin: Secondary | ICD-10-CM | POA: Insufficient documentation

## 2023-08-13 DIAGNOSIS — Z83438 Family history of other disorder of lipoprotein metabolism and other lipidemia: Secondary | ICD-10-CM | POA: Diagnosis not present

## 2023-08-13 DIAGNOSIS — M810 Age-related osteoporosis without current pathological fracture: Secondary | ICD-10-CM | POA: Insufficient documentation

## 2023-08-13 DIAGNOSIS — I2510 Atherosclerotic heart disease of native coronary artery without angina pectoris: Secondary | ICD-10-CM | POA: Insufficient documentation

## 2023-08-13 DIAGNOSIS — Z888 Allergy status to other drugs, medicaments and biological substances status: Secondary | ICD-10-CM | POA: Diagnosis not present

## 2023-08-13 DIAGNOSIS — C50812 Malignant neoplasm of overlapping sites of left female breast: Secondary | ICD-10-CM | POA: Insufficient documentation

## 2023-08-13 DIAGNOSIS — I509 Heart failure, unspecified: Secondary | ICD-10-CM | POA: Insufficient documentation

## 2023-08-13 DIAGNOSIS — Z803 Family history of malignant neoplasm of breast: Secondary | ICD-10-CM | POA: Insufficient documentation

## 2023-08-13 DIAGNOSIS — N6489 Other specified disorders of breast: Secondary | ICD-10-CM | POA: Diagnosis not present

## 2023-08-13 DIAGNOSIS — C50912 Malignant neoplasm of unspecified site of left female breast: Secondary | ICD-10-CM | POA: Insufficient documentation

## 2023-08-13 DIAGNOSIS — N644 Mastodynia: Secondary | ICD-10-CM | POA: Insufficient documentation

## 2023-08-13 DIAGNOSIS — Z809 Family history of malignant neoplasm, unspecified: Secondary | ICD-10-CM | POA: Insufficient documentation

## 2023-08-13 NOTE — Assessment & Plan Note (Signed)
 Patient is currently on Coumadin dosing with INR  2-3.

## 2023-08-13 NOTE — Assessment & Plan Note (Signed)
 Follow up with cardiology

## 2023-08-13 NOTE — Assessment & Plan Note (Signed)
Avoid nephrotoxins  

## 2023-08-13 NOTE — Assessment & Plan Note (Signed)
Patient declined genetic testing. 

## 2023-08-13 NOTE — Assessment & Plan Note (Signed)
 Patient has not had bone density done.  Recommend to obtain a baseline bone density for further evaluation. For patient's endocrine therapy, consider tamoxifen. It is unclear why it was tamoxifen discontinued in 2001.

## 2023-08-13 NOTE — Progress Notes (Signed)
 Hematology/Oncology Consult Note [phone] [phone]     REFERRING PROVIDER: Alla Amis, MD    CHIEF COMPLAINTS/PURPOSE OF CONSULTATION:  Left breast cancer.  ASSESSMENT & PLAN:   Invasive ductal carcinoma of breast, female, left [facility]) Pathology and radiology counseling: Discussed with the patient, the details of pathology including the type of breast cancer,the clinical staging, the significance of ER, PR and HER-2/neu receptors and the implications for treatment. After reviewing the pathology in detail, we proceeded to discuss the different treatment options between surgery, radiation, antiestrogen therapies.  Given her age and other medical comorbidities, if she is a candidate for proceeding with surgery, recommend to omit sentinel lymph node biopsy as well as adjuvant radiation.  Consider adjuvant endocrine therapy if she can tolerate. Patient has cardiomyopathy/CHF, fluid retention.  She is interested to have a discussion with radiation oncology to see if any additional radiation could be done as a definitive treatment.  Patient has a history of left breast radiation.  Refer to radiation oncology.    Osteoporosis Patient has not had bone density done.  Recommend to obtain a baseline bone density for further evaluation. For patient's endocrine therapy, consider tamoxifen. It is unclear why it was tamoxifen discontinued in 2001.  PE (pulmonary thromboembolism) (HCC) Patient is currently on Coumadin dosing with INR  2-3.  Stage 3a chronic kidney disease (HCC) Avoid nephrotoxins  Cardiomyopathy [facility]) Follow-up with cardiology   Orders Placed This Encounter  Procedures   DG Bone Density    Standing Status:   Future    Expected Date:   08/20/2023    Expiration Date:   08/12/2024    Reason for Exam (SYMPTOM  OR DIAGNOSIS REQUIRED):   breast cancer    Preferred imaging location?:   [facility]   Ambulatory referral to Radiation Oncology     Referral Priority:   Routine    Referral Type:   Consultation    Referral Reason:   Specialty Services Required    Requested Specialty:   Radiation Oncology    Number of Visits Requested:   1   Follow up to be determined. All questions were answered. The patient knows to call the clinic with any problems, questions or concerns.  Zelphia Cap, MD, PhD [facility] Hematology Oncology 08/13/2023    HISTORY OF PRESENTING ILLNESS:  Cynthia Raymond 89 y.o. female presents to establish care for left breast cancer. I have reviewed her chart and materials related to her cancer extensively and collaborated history with the patient. Summary of oncologic history is as follows: Oncology History  Invasive ductal carcinoma of breast, female, left (HCC)  07/29/2023 Imaging   Bilateral diagnostic breast mammogram showed  1. LEFT breast 15 mm irregular mass in the 3 o'clock position is suspicious for malignancy. Recommend further assessment with ultrasound-guided biopsy. 2. No mammographic or sonographic abnormality in the area of focal pain in the inner LEFT breast. 3. No LEFT axillary lymphadenopathy. 4. No mammographic evidence of malignancy in the RIGHT breast.     08/05/2023 Initial Diagnosis   Invasive ductal carcinoma of breast, female, left  Patient underwent left breast biopsy  1. Breast, left, needle core biopsy, 3 o'clock, 1cmfn, retro (ribbon clip) :       - INVASIVE DUCTAL CARCINOMA, SEE NOTE       - TUBULE FORMATION: SCORE 2/3       - NUCLEAR PLEOMORPHISM: SCORE 2/3       - MITOTIC COUNT: SCORE 1/3       -  TOTAL SCORE: 5/9       - OVERALL GRADE: I/III       - LYMPHOVASCULAR INVASION: NOT IDENTIFIED       - CANCER LENGTH: 11 MM IN GREATEST LINEAR DIMENSION       - CALCIFICATIONS: PRESENT .  ER 100% positive, PR 100% positive, HER2 - (0+)   08/13/2023 Cancer Staging   Staging form: Breast, AJCC 8th Edition - Clinical stage from 08/13/2023: Stage IA (cT1c, cN0, cM0, G2, ER+,  PR+, HER2-) - Signed by Babara Call, MD on 08/13/2023 Stage prefix: Initial diagnosis Histologic grading system: 3 grade system    Patient reports a remote history of left Breast cancer which was treated in 2001.  She underwent lumpectomy, radiation and endocrine therapy with tamoxifen was initiated and stopped.  Length of treatment was unclear.  Patient is accompanied by her daughter. Patient has history of CHF, cardiomyopathy and follows up with cardiology.  Patient has met surgery Dr. Cesar yesterday.  Breast mass operable and patient is waiting for cardiology clearance.  Per daughter cardiology has commented that it is high risk for patient to proceed with surgery.  MEDICAL HISTORY:  Past Medical History:  Diagnosis Date   Cancer (HCC)    breast   Graves disease    Hypertension    Osteoporosis    Thyroid  disease     SURGICAL HISTORY: Past Surgical History:  Procedure Laterality Date   BACK SURGERY     X3   BREAST BIOPSY Left 08/05/2023   US  LT BREAST BX W LOC DEV 1ST LESION IMG BX SPEC US  GUIDE 08/05/2023 [facility]   BREAST LUMPECTOMY Left    @ 2000   CATARACT EXTRACTION W/PHACO Left 02/18/2018   Procedure: CATARACT EXTRACTION PHACO AND INTRAOCULAR LENS PLACEMENT (IOC) LEFT;  Surgeon: Mittie Gaskin, MD;  Location: [facility];  Service: Ophthalmology;  Laterality: Left;   CATARACT EXTRACTION W/PHACO Right 03/17/2018   Procedure: CATARACT EXTRACTION PHACO AND INTRAOCULAR LENS PLACEMENT (IOC)  RIGHT;  Surgeon: Mittie Gaskin, MD;  Location: [facility];  Service: Ophthalmology;  Laterality: Right;   ELBOW SURGERY Right    HAND SURGERY Right    JOINT REPLACEMENT Bilateral    Both knees, left shoulder   ROTATOR CUFF REPAIR Bilateral     SOCIAL HISTORY: Social History   Socioeconomic History   Marital status: Widowed    Spouse name: Earle   Number of children: 4   Years of education: 12   Highest education level: Not on file   Occupational History   Not on file  Tobacco Use   Smoking status: Former    Current packs/day: 0.00    Types: Cigarettes    Quit date: 01/08/1987    Years since quitting: 36.6   Smokeless tobacco: Never   Tobacco comments:    Quit 1989  Substance and Sexual Activity   Alcohol use: No   Drug use: No   Sexual activity: Not on file  Other Topics Concern   Not on file  Social History Narrative   Patient is married to [place], has 4 children, 1 deceased   Patient is right handed   Education level is 12   Caffeine consumption is 2 cups daily         Social Drivers of Corporate investment banker Strain: Low Risk  (08/07/2023)   Received from [facility]   Overall Financial Resource Strain (CARDIA)    Difficulty of Paying Living Expenses: Not hard at  all  Food Insecurity: No Food Insecurity (08/07/2023)   Received from [facility]   Hunger Vital Sign    Within the past 12 months, you worried that your food would run out before you got the money to buy more.: Never true    Within the past 12 months, the food you bought just didn't last and you didn't have money to get more.: Never true  Transportation Needs: No Transportation Needs (08/07/2023)   Received from [facility] - Transportation    In the past 12 months, has lack of transportation kept you from medical appointments or from getting medications?: No    Lack of Transportation (Non-Medical): No  Physical Activity: Unknown (04/08/2018)   Received from [facility]   Exercise Vital Sign    On average, how many days per week do you engage in moderate to strenuous exercise (like a brisk walk)?: 0 days    Minutes of Exercise per Session: Not on file  Stress: No Stress Concern Present (04/08/2018)   Received from [facility] - Occupational Stress Questionnaire    Feeling of Stress : Only a little   Social Connections: Socially Isolated (01/27/2023)   Social Connection and Isolation Panel    Frequency of Communication with Friends and Family: More than three times a week    Frequency of Social Gatherings with Friends and Family: Twice a week    Attends Religious Services: Never    Database administrator or Organizations: No    Attends Banker Meetings: Never    Marital Status: Widowed  Intimate Partner Violence: Not At Risk (01/27/2023)   Humiliation, Afraid, Rape, and Kick questionnaire    Fear of Current or Ex-Partner: No    Emotionally Abused: No    Physically Abused: No    Sexually Abused: No    FAMILY HISTORY: Family History  Problem Relation Age of Onset   Breast cancer Mother 58 - 79   Cancer Mother    Heart attack Father    Breast cancer Sister 67 - 25   Cancer Sister    Heart disease Sister    Hyperlipidemia Sister    Heart attack Sister    Breast cancer Sister 52 - 53   Breast cancer Sister 51 - 2   Cancer Daughter    Cancer Brother     ALLERGIES:  is allergic to penicillins, gabapentin, morphine and codeine, sulfa antibiotics, amitriptyline, buspirone, latex, and morphine.  MEDICATIONS:  Current Outpatient Medications  Medication Sig Dispense Refill   acetaminophen  (TYLENOL ) 500 MG tablet Take 1,000 mg by mouth every 8 (eight) hours as needed.     furosemide (LASIX) 40 MG tablet Take 40 mg by mouth.     JARDIANCE 10 MG TABS tablet Take 10 mg by mouth daily.     levothyroxine  (SYNTHROID ) 112 MCG tablet Take 112 mcg by mouth daily before breakfast.     Melatonin 3 MG CAPS Take 2 tablets by mouth daily as needed.     pantoprazole (PROTONIX) 40 MG tablet Take 40 mg by mouth every morning.     ramipril  (ALTACE ) 10 MG capsule Take 10 mg by mouth daily.     traZODone (DESYREL) 100 MG tablet Take 150 mg by mouth at bedtime.     venlafaxine  XR (EFFEXOR -XR) 75 MG 24 hr capsule Take 3 capsules (225 mg total) by mouth daily with breakfast. 90  capsule 0    warfarin (COUMADIN) 5 MG tablet Take one tablet, 5mg , everyday     aspirin  81 MG tablet Take 81 mg by mouth daily as needed for pain. (Patient not taking: Reported on 08/13/2023)     Cyanocobalamin (VITAMIN B 12 PO) Take 1 tablet by mouth daily. (Patient not taking: Reported on 08/13/2023)     vitamin C  (ASCORBIC ACID ) 500 MG tablet Take 500 mg by mouth daily. (Patient not taking: Reported on 08/13/2023)     Vitamin D, Ergocalciferol, (DRISDOL) 1.25 MG (50000 UNIT) CAPS capsule Take 50,000 Units by mouth every 30 (thirty) days. (Patient not taking: Reported on 08/13/2023)     No current facility-administered medications for this visit.   Facility-Administered Medications Ordered in Other Visits  Medication Dose Route Frequency Provider Last Rate Last Admin   albuterol  (PROVENTIL ) (2.5 MG/3ML) 0.083% nebulizer solution 2.5 mg  2.5 mg Nebulization Once Tamea Dedra CROME, MD        Review of Systems  Constitutional:  Negative for appetite change, chills, fatigue and fever.  HENT:   Negative for hearing loss and voice change.   Eyes:  Negative for eye problems.  Respiratory:  Negative for chest tightness and cough.   Cardiovascular:  Negative for chest pain.  Gastrointestinal:  Negative for abdominal distention, abdominal pain and blood in stool.  Endocrine: Negative for hot flashes.  Genitourinary:  Negative for difficulty urinating and frequency.   Musculoskeletal:  Negative for arthralgias.  Skin:  Negative for itching and rash.  Neurological:  Negative for extremity weakness.  Hematological:  Negative for adenopathy.  Psychiatric/Behavioral:  Negative for confusion.    Left breast pain.  PHYSICAL EXAMINATION: ECOG PERFORMANCE STATUS: 2 - Symptomatic, <50% confined to bed  Vitals:   08/13/23 1119  BP: (!) 142/70  Pulse: 67  Resp: 16  Temp: (!) 97 F (36.1 C)  SpO2: 96%   Filed Weights    Physical Exam Constitutional:      General: She is not in acute distress.     Appearance: She is not diaphoretic.     Comments: Frail appearance elderly female, sits in the wheelchair.  HENT:     Head: Normocephalic and atraumatic.  Eyes:     General: No scleral icterus. Cardiovascular:     Rate and Rhythm: Normal rate and regular rhythm.     Heart sounds: No murmur heard. Pulmonary:     Effort: Pulmonary effort is normal. No respiratory distress.     Breath sounds: Normal breath sounds.  Abdominal:     General: Bowel sounds are normal. There is no distension.     Palpations: Abdomen is soft.  Musculoskeletal:        General: Normal range of motion.     Cervical back: Normal range of motion and neck supple.  Skin:    General: Skin is warm and dry.     Findings: No erythema.  Neurological:     Mental Status: She is alert and oriented to person, place, and time. Mental status is at baseline.     Motor: No abnormal muscle tone.  Psychiatric:        Mood and Affect: Affect normal.    Breast exam was performed in seated and lying down position. Patient is status post left breast lumpectomy with tissue thickening at the surgical sites. No palpable mass in the right breast.  No palpable axillary adenopathy bilaterally.   LABORATORY DATA:  I have reviewed the data as listed  Latest Ref Rng & Units 01/28/2023    4:45 AM 01/27/2023   10:49 AM 06/25/2021    1:47 PM  CBC  WBC 4.0 - 10.5 K/uL 5.6  6.2  5.2   Hemoglobin 12.0 - 15.0 g/dL 86.5  86.7  87.0   Hematocrit 36.0 - 46.0 % 40.2  39.6  40.7   Platelets 150 - 400 K/uL 207  209  218       Latest Ref Rng & Units 01/27/2023   10:49 AM 06/25/2021    1:47 PM 05/20/2020   10:06 AM  CMP  Glucose 70 - 99 mg/dL 880  882  893   BUN 8 - 23 mg/dL 20  14  17    Creatinine 0.44 - 1.00 mg/dL 8.87  9.19  8.77   Sodium 135 - 145 mmol/L 135  142  135   Potassium 3.5 - 5.1 mmol/L 4.6  3.8  3.7   Chloride 98 - 111 mmol/L 103  108  98   CO2 22 - 32 mmol/L 23  27  29    Calcium  8.9 - 10.3 mg/dL 8.4  9.2  8.9   Total  Protein 6.5 - 8.1 g/dL 6.3   6.7   Total Bilirubin 0.0 - 1.2 mg/dL 0.7   1.1   Alkaline Phos 38 - 126 U/L 45   56   AST 15 - 41 U/L 22   24   ALT 0 - 44 U/L 21   19      RADIOGRAPHIC STUDIES: I have personally reviewed the radiological images as listed and agreed with the findings in the report. US  LT BREAST BX W LOC DEV 1ST LESION IMG BX SPEC US  GUIDE Addendum Date: 08/06/2023 ADDENDUM REPORT: 08/06/2023 11:32 ADDENDUM: PATHOLOGY revealed: 1. Breast, left, needle core biopsy, 3 o'clock, 1 cmfn, retro (ribbon clip) : - INVASIVE DUCTAL CARCINOMA - OVERALL GRADE: I/III - LYMPHOVASCULAR INVASION: NOT IDENTIFIED - CANCER LENGTH: 11 MM IN GREATEST LINEAR DIMENSION - CALCIFICATIONS: PRESENT. Pathology results are CONCORDANT with imaging findings, per Dirk Arrant M.D. Pathology results and recommendations were discussed with patient's daughter Pamlea Finder) via telephone on 08/06/2023 by Rock Hover RN. Hillary stated patient's biopsy site was doing fine and patient continues to use ice pack intermittently. Post biopsy care instructions were reviewed, questions were answered and my direct phone number was provided. Instructed to call [facility] for any additional questions or concerns related to biopsy site. RECOMMENDATION: 1. Surgical and oncological consultation, if clinically desired by patient given her age and based on goals of care. Request for oncological consultation relayed to Shasta Ada RN at [facility] by Rock Hover RN on 08/06/2023. Pathology results reported by Rock Hover RN on 08/06/2023. Electronically Signed   By: Dirk Arrant M.D.   On: 08/06/2023 11:32   Result Date: 08/06/2023 CLINICAL DATA:  89 year old female presenting for ultrasound-guided biopsy of a suspicious mass in the LEFT breast. EXAM: ULTRASOUND GUIDED LEFT BREAST CORE NEEDLE BIOPSY COMPARISON:  Previous exam(s). PROCEDURE: I met with the patient and we discussed the procedure of  ultrasound-guided biopsy, including benefits and alternatives. We discussed the high likelihood of a successful procedure. We discussed the risks of the procedure, including infection, bleeding, tissue injury, clip migration, and inadequate sampling. Informed written consent was given. The usual time-out protocol was performed immediately prior to the procedure. Lesion quadrant: Upper outer quadrant Using sterile technique and 1% Lidocaine  as local anesthetic, under direct ultrasound visualization, a 14 gauge spring-loaded device  was used to perform biopsy of a mass in the left breast 3 o'clock position 1 cm from the nipple using an inferior approach. At the conclusion of the procedure, a ribbon shaped tissue marker clip was deployed into the biopsy cavity. Follow up 2 view mammogram was performed and dictated separately. IMPRESSION: Ultrasound guided biopsy of a mass in the LEFT breast 3 o'clock position (RIBBON clip). No apparent complications. Electronically Signed: By: Dirk Arrant M.D. On: 08/05/2023 08:31   MM CLIP PLACEMENT LEFT Result Date: 08/05/2023 CLINICAL DATA:  Status post ultrasound-guided biopsy of a suspicious mass in the LEFT breast. EXAM: 3D DIAGNOSTIC LEFT MAMMOGRAM POST ULTRASOUND BIOPSY COMPARISON:  Previous exam(s). ACR Breast Density Category b: There are scattered areas of fibroglandular density. FINDINGS: 3D Mammographic images were obtained following ultrasound guided biopsy of a mass in the LEFT breast 3 o'clock position 1 cm from the nipple. The RIBBON shaped biopsy marking clip is in expected position at the site of biopsy. IMPRESSION: Appropriate positioning of the RIBBON shaped biopsy marking clip at the site of biopsy in the LEFT breast 3 o'clock position. Final Assessment: Post Procedure Mammograms for Marker Placement Electronically Signed   By: Dirk Arrant M.D.   On: 08/05/2023 08:49   MM 3D DIAGNOSTIC MAMMOGRAM BILATERAL BREAST Result Date: 07/29/2023 CLINICAL  DATA:  89 year old female presenting with focal pain in the LEFT breast. History of LEFT breast lumpectomy in 2000. EXAM: DIGITAL DIAGNOSTIC BILATERAL MAMMOGRAM WITH TOMOSYNTHESIS AND CAD; ULTRASOUND LEFT BREAST LIMITED TECHNIQUE: Bilateral digital diagnostic mammography and breast tomosynthesis was performed. The images were evaluated with computer-aided detection. ; Targeted ultrasound examination of the left breast was performed. COMPARISON:  Previous exam(s). ACR Breast Density Category b: There are scattered areas of fibroglandular density. FINDINGS: MAMMOGRAM: Right: A questioned asymmetry in the retroareolar right breast does not persist on additional spot tomosynthesis views, consistent with superimposed fibroglandular tissue. No new suspicious mass, calcification, or other findings are identified in the right breast. Left: Diagnostic mammographic images were obtained over the area of focal pain in the left breast. No suspicious mammographic finding is identified in this area. Re-demonstrated postlumpectomy changes in upper-outer left breast middle to posterior depth. There is a new 18 mm spiculated mass with associated dystrophic calcification located anterior to the lumpectomy bed in the outer central left breast anterior depth (spot CC image 40/55, spot MLO image 65/76). No additional suspicious findings are identified in the left breast. ULTRASOUND: Targeted left breast ultrasound was performed: At 3 o'clock 1 cm from the nipple, there is an irregular hypoechoic mass with moderate internal vascularity. There is an echogenic focus within the mass, corresponding with the dystrophic calcifications seen on mammogram. It measures 15 x 11 x 14 mm. This corresponds with the spiculated mass seen on mammogram. Targeted left breast ultrasound was performed in the area of focal pain from 9-10 o'clock 12 cm from nipple. This demonstrates normal fibroglandular tissue. Targeted left axillary ultrasound demonstrates a  morphologically benign lymph node. IMPRESSION: 1. LEFT breast 15 mm irregular mass in the 3 o'clock position is suspicious for malignancy. Recommend further assessment with ultrasound-guided biopsy. 2. No mammographic or sonographic abnormality in the area of focal pain in the inner LEFT breast. 3. No LEFT axillary lymphadenopathy. 4. No mammographic evidence of malignancy in the RIGHT breast. RECOMMENDATION: LEFT breast ultrasound-guided biopsy (1 site). I have discussed the findings and recommendations with the patient. The biopsy procedure was discussed with the patient and questions were answered. Patient expressed their understanding  of the biopsy recommendation. Patient will be scheduled for biopsy at her earliest convenience by the schedulers. Ordering provider will be notified. If applicable, a reminder letter will be sent to the patient regarding the next appointment. BI-RADS CATEGORY  4: Suspicious. Electronically Signed   By: Dirk Arrant M.D.   On: 07/29/2023 15:51   US  LIMITED ULTRASOUND INCLUDING AXILLA LEFT BREAST  Result Date: 07/29/2023 CLINICAL DATA:  89 year old female presenting with focal pain in the LEFT breast. History of LEFT breast lumpectomy in 2000. EXAM: DIGITAL DIAGNOSTIC BILATERAL MAMMOGRAM WITH TOMOSYNTHESIS AND CAD; ULTRASOUND LEFT BREAST LIMITED TECHNIQUE: Bilateral digital diagnostic mammography and breast tomosynthesis was performed. The images were evaluated with computer-aided detection. ; Targeted ultrasound examination of the left breast was performed. COMPARISON:  Previous exam(s). ACR Breast Density Category b: There are scattered areas of fibroglandular density. FINDINGS: MAMMOGRAM: Right: A questioned asymmetry in the retroareolar right breast does not persist on additional spot tomosynthesis views, consistent with superimposed fibroglandular tissue. No new suspicious mass, calcification, or other findings are identified in the right breast. Left: Diagnostic  mammographic images were obtained over the area of focal pain in the left breast. No suspicious mammographic finding is identified in this area. Re-demonstrated postlumpectomy changes in upper-outer left breast middle to posterior depth. There is a new 18 mm spiculated mass with associated dystrophic calcification located anterior to the lumpectomy bed in the outer central left breast anterior depth (spot CC image 40/55, spot MLO image 65/76). No additional suspicious findings are identified in the left breast. ULTRASOUND: Targeted left breast ultrasound was performed: At 3 o'clock 1 cm from the nipple, there is an irregular hypoechoic mass with moderate internal vascularity. There is an echogenic focus within the mass, corresponding with the dystrophic calcifications seen on mammogram. It measures 15 x 11 x 14 mm. This corresponds with the spiculated mass seen on mammogram. Targeted left breast ultrasound was performed in the area of focal pain from 9-10 o'clock 12 cm from nipple. This demonstrates normal fibroglandular tissue. Targeted left axillary ultrasound demonstrates a morphologically benign lymph node. IMPRESSION: 1. LEFT breast 15 mm irregular mass in the 3 o'clock position is suspicious for malignancy. Recommend further assessment with ultrasound-guided biopsy. 2. No mammographic or sonographic abnormality in the area of focal pain in the inner LEFT breast. 3. No LEFT axillary lymphadenopathy. 4. No mammographic evidence of malignancy in the RIGHT breast. RECOMMENDATION: LEFT breast ultrasound-guided biopsy (1 site). I have discussed the findings and recommendations with the patient. The biopsy procedure was discussed with the patient and questions were answered. Patient expressed their understanding of the biopsy recommendation. Patient will be scheduled for biopsy at her earliest convenience by the schedulers. Ordering provider will be notified. If applicable, a reminder letter will be sent to the  patient regarding the next appointment. BI-RADS CATEGORY  4: Suspicious. Electronically Signed   By: Dirk Arrant M.D.   On: 07/29/2023 15:51   MM Outside Films Mammo Result Date: 07/23/2023 This examination belongs to an outside facility and is stored here for comparison purposes only.  Contact the originating outside institution for any associated report or interpretation.  MM Outside Films Mammo Result Date: 07/23/2023 This examination belongs to an outside facility and is stored here for comparison purposes only.  Contact the originating outside institution for any associated report or interpretation.  MM Outside Films Mammo Result Date: 07/23/2023 This examination belongs to an outside facility and is stored here for comparison purposes only.  Contact the originating outside  institution for any associated report or interpretation.  MM Outside [facility] Mammo Result Date: 07/23/2023 This examination belongs to an outside facility and is stored here for comparison purposes only.  Contact the originating outside institution for any associated report or interpretation.

## 2023-08-13 NOTE — Assessment & Plan Note (Addendum)
 Pathology and radiology counseling: Discussed with the patient, the details of pathology including the type of breast cancer,the clinical staging, the significance of ER, PR and HER-2/neu receptors and the implications for treatment. After reviewing the pathology in detail, we proceeded to discuss the different treatment options between surgery, radiation, antiestrogen therapies.  Given her age and other medical comorbidities, if she is a candidate for proceeding with surgery, recommend to omit sentinel lymph node biopsy as well as adjuvant radiation.  Consider adjuvant endocrine therapy if she can tolerate. Patient has cardiomyopathy/CHF, fluid retention.  She is interested to have a discussion with radiation oncology to see if any additional radiation could be done as a definitive treatment.  Patient has a history of left breast radiation.  Refer to radiation oncology.

## 2023-08-13 NOTE — Progress Notes (Signed)
 Patient is very very anxious and upset about today's visit. She is currently having any symptoms.

## 2023-08-20 ENCOUNTER — Ambulatory Visit
Admission: RE | Admit: 2023-08-20 | Discharge: 2023-08-20 | Disposition: A | Discharge status: Home / Self Care | Source: Ambulatory Visit | Attending: Radiation Oncology | Admitting: Radiation Oncology

## 2023-08-20 ENCOUNTER — Encounter: Payer: Self-pay | Admitting: Radiation Oncology

## 2023-08-20 ENCOUNTER — Encounter: Payer: Self-pay | Admitting: *Deleted

## 2023-08-20 VITALS — BP 138/69 | HR 74 | Temp 98.3°F | Resp 16

## 2023-08-20 DIAGNOSIS — C50412 Malignant neoplasm of upper-outer quadrant of left female breast: Secondary | ICD-10-CM | POA: Insufficient documentation

## 2023-08-20 DIAGNOSIS — C50912 Malignant neoplasm of unspecified site of left female breast: Secondary | ICD-10-CM

## 2023-08-20 DIAGNOSIS — Z803 Family history of malignant neoplasm of breast: Secondary | ICD-10-CM | POA: Diagnosis not present

## 2023-08-20 DIAGNOSIS — Z87891 Personal history of nicotine dependence: Secondary | ICD-10-CM | POA: Diagnosis not present

## 2023-08-20 DIAGNOSIS — M810 Age-related osteoporosis without current pathological fracture: Secondary | ICD-10-CM | POA: Diagnosis not present

## 2023-08-20 DIAGNOSIS — Z79899 Other long term (current) drug therapy: Secondary | ICD-10-CM | POA: Insufficient documentation

## 2023-08-20 DIAGNOSIS — I10 Essential (primary) hypertension: Secondary | ICD-10-CM | POA: Insufficient documentation

## 2023-08-20 DIAGNOSIS — E079 Disorder of thyroid, unspecified: Secondary | ICD-10-CM | POA: Diagnosis not present

## 2023-08-20 DIAGNOSIS — Z1721 Progesterone receptor positive status: Secondary | ICD-10-CM | POA: Insufficient documentation

## 2023-08-20 DIAGNOSIS — Z809 Family history of malignant neoplasm, unspecified: Secondary | ICD-10-CM | POA: Diagnosis not present

## 2023-08-20 NOTE — Progress Notes (Signed)
 NEW PATIENT EVALUATION  Name: Cynthia Raymond  MRN: [id number]  Date:   08/20/2023     DOB: 1929/06/18   This 89 y.o. 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  Remotely  REFERRING PHYSICIAN: Babara Call, MD  CHIEF COMPLAINT:  Chief Complaint  Patient presents with   Breast Cancer    DIAGNOSIS: The encounter diagnosis was Invasive ductal carcinoma of breast, female, left (HCC).   PREVIOUS INVESTIGATIONS:  Mammograms ultrasound reviewed Pathology report reviewed Clinical notes reviewed  HPI: Patient is a 89 year old female who has a history of lumpectomy and whole breast radiation to her left breast back in 2001.  Patient has multiple medical comorbidities including CHF cardiomyopathy.  She was having left breast pain underwent mammography showing a 1.5 cm irregular mass at 3 o'clock position of the left breast suspicious for malignancy.  Left axillary contents were normal on sonographic examination.  She underwent biopsy showing invasive ductal carcinoma overall grade 1.  Tumor was ER/PR positive HER2/neu not overexpressed.  She has been seen by surgeon with recommendation for either lumpectomy or mastectomy based on her previous history of radiation.  She is having difficulty with cardiac clearance from her cardiologist and is seen today for consideration of treatment.  She states she is not having any breast tenderness or pain at this time.  PLANNED TREATMENT REGIMEN: Possible lumpectomy versus accelerated partial breast radiation  PAST MEDICAL HISTORY:  has a past medical history of Cancer (HCC), Graves disease, Hypertension, Osteoporosis, and Thyroid  disease.    PAST SURGICAL HISTORY:  Past Surgical History:  Procedure Laterality Date   BACK SURGERY     X3   BREAST BIOPSY Left 08/05/2023   US  LT BREAST BX W LOC DEV 1ST  LESION IMG BX SPEC US  GUIDE 08/05/2023 [facility]   BREAST LUMPECTOMY Left    @ 2000   CATARACT EXTRACTION W/PHACO Left 02/18/2018   Procedure: CATARACT EXTRACTION PHACO AND INTRAOCULAR LENS PLACEMENT (IOC) LEFT;  Surgeon: Mittie Gaskin, MD;  Location: [facility];  Service: Ophthalmology;  Laterality: Left;   CATARACT EXTRACTION W/PHACO Right 03/17/2018   Procedure: CATARACT EXTRACTION PHACO AND INTRAOCULAR LENS PLACEMENT (IOC)  RIGHT;  Surgeon: Mittie Gaskin, MD;  Location: [facility];  Service: Ophthalmology;  Laterality: Right;   ELBOW SURGERY Right    HAND SURGERY Right    JOINT REPLACEMENT Bilateral    Both knees, left shoulder   ROTATOR CUFF REPAIR Bilateral     FAMILY HISTORY: family history includes Breast cancer (age of onset: 81 - 36) in her mother, sister, sister, and sister; Cancer in her brother, daughter, mother, and sister; Heart attack in her father and sister; Heart disease in her sister; Hyperlipidemia in her sister.  SOCIAL HISTORY:  reports that she quit smoking about 36 years ago. She has never used smokeless tobacco. She reports that she does not drink alcohol and does not use drugs.  ALLERGIES: Penicillins, Gabapentin, Morphine and codeine, Sulfa antibiotics, Amitriptyline, Buspirone, Latex, and Morphine  MEDICATIONS:  Current Outpatient Medications  Medication Sig Dispense Refill   acetaminophen  (TYLENOL ) 500 MG tablet Take 1,000 mg by mouth every 8 (eight) hours as needed.     furosemide (LASIX) 40 MG tablet Take 40 mg by mouth.     JARDIANCE 10 MG TABS tablet Take 10 mg by mouth daily.     levothyroxine  (  SYNTHROID ) 112 MCG tablet Take 112 mcg by mouth daily before breakfast.     Melatonin 3 MG CAPS Take 2 tablets by mouth daily as needed.     pantoprazole (PROTONIX) 40 MG tablet Take 40 mg by mouth every morning.     ramipril  (ALTACE ) 10 MG capsule Take 10 mg by mouth daily.     traZODone (DESYREL) 100 MG tablet Take 150 mg  by mouth at bedtime.     venlafaxine  XR (EFFEXOR -XR) 75 MG 24 hr capsule Take 3 capsules (225 mg total) by mouth daily with breakfast. 90 capsule 0   warfarin (COUMADIN) 5 MG tablet Take one tablet, 5mg , everyday     aspirin  81 MG tablet Take 81 mg by mouth daily as needed for pain. (Patient not taking: Reported on 08/20/2023)     Cyanocobalamin (VITAMIN B 12 PO) Take 1 tablet by mouth daily. (Patient not taking: Reported on 08/20/2023)     vitamin C  (ASCORBIC ACID ) 500 MG tablet Take 500 mg by mouth daily. (Patient not taking: Reported on 08/20/2023)     Vitamin D, Ergocalciferol, (DRISDOL) 1.25 MG (50000 UNIT) CAPS capsule Take 50,000 Units by mouth every 30 (thirty) days. (Patient not taking: Reported on 08/20/2023)     No current facility-administered medications for this encounter.   Facility-Administered Medications Ordered in Other Encounters  Medication Dose Route Frequency Provider Last Rate Last Admin   albuterol  (PROVENTIL ) (2.5 MG/3ML) 0.083% nebulizer solution 2.5 mg  2.5 mg Nebulization Once Tamea Dedra CROME, MD        ECOG PERFORMANCE STATUS:  0 - Asymptomatic  REVIEW OF SYSTEMS: Patient has past medical history significant for Graves' disease hypertension osteoporosis cardiomyopathy congestive heart failure. Patient denies any weight loss, fatigue, weakness, fever, chills or night sweats. Patient denies any loss of vision, blurred vision. Patient denies any ringing  of the ears or hearing loss. No irregular heartbeat. Patient denies heart murmur or history of fainting. Patient denies any chest pain or pain radiating to her upper extremities. Patient denies any shortness of breath, difficulty breathing at night, cough or hemoptysis. Patient denies any swelling in the lower legs. Patient denies any nausea vomiting, vomiting of blood, or coffee ground material in the vomitus. Patient denies any stomach pain. Patient states has had normal bowel movements no significant constipation or  diarrhea. Patient denies any dysuria, hematuria or significant nocturia. Patient denies any problems walking, swelling in the joints or loss of balance. Patient denies any skin changes, loss of hair or loss of weight. Patient denies any excessive worrying or anxiety or significant depression. Patient denies any problems with insomnia. Patient denies excessive thirst, polyuria, polydipsia. Patient denies any swollen glands, patient denies easy bruising or easy bleeding. Patient denies any recent infections, allergies or URI. Patient s visual fields have not changed significantly in recent time.   PHYSICAL EXAM: BP 138/69   Pulse 74   Temp 98.3 F (36.8 C) (Tympanic)   Resp 16  Frail-appearing elderly female wheelchair-bound in NAD.  I cannot detect any palpable mass in her left breast.  Right breast is clear.  No axillary or supraclavicular adenopathy is identified.  Well-developed well-nourished patient in NAD. HEENT reveals PERLA, EOMI, discs not visualized.  Oral cavity is clear. No oral mucosal lesions are identified. Neck is clear without evidence of cervical or supraclavicular adenopathy. Lungs are clear to A&P. Cardiac examination is essentially unremarkable with regular rate and rhythm without murmur rub or thrill. Abdomen is benign with no organomegaly  or masses noted. Motor sensory and DTR levels are equal and symmetric in the upper and lower extremities. Cranial nerves II through XII are grossly intact. Proprioception is intact. No peripheral adenopathy or edema is identified. No motor or sensory levels are noted. Crude visual fields are within normal range.  LABORATORY DATA: Pathology reports reviewed    RADIOLOGY RESULTS: Mammogram and ultrasound reviewed compatible with above-stated findings   IMPRESSION: Stage Ia ER positive invasive mammary carcinoma of the left breast and patient previously treated for breast cancer over 20 years prior in 89 year old female  PLAN: At this time I  have recommended possible lumpectomy under IV sedation if possible.  She will be assigned a another surgeon through the help of her nurse navigator.  Should they decline any further surgery I would offer accelerated partial breast irradiation 35 Gray in 10 fractions to her left breast in hopes this could arrest development and progression of further breast cancer.  Risks and benefits of accelerated partial breast radiation including extremely low side effect profile with possibility of further fat necrosis based on her prior radiation therapy all were discussed with the patient and her daughter.  Once she has further surgical opinion we will make further determination about proceeding with adjuvant radiation therapy.  I would like to take this opportunity to thank you for allowing me to participate in the care of your patient.SABRA Marcey Penton, MD

## 2023-08-20 NOTE — Progress Notes (Signed)
 Met with Cynthia Raymond and her daughter during Dr. Lenn appt.   Referral placed to [facility] for 2nd opinion regarding breast surgery.

## 2023-08-25 NOTE — Progress Notes (Unsigned)
 Patient ID: Cynthia Raymond, female   DOB: [birth year], 89 y.o.   MRN: [id number]  Chief Complaint:  Recurrent left breast cancer.   History of Present Illness Cynthia Raymond is a 89 y.o. female with prior h/o BCT for left breast Ca, 2+ decades ago.   She has been experiencing pain in her left breast for about 6 months.  The pain is so severe during activities like walking and sitting, where her only relief is to hold her breast. This pain syndrome led to a diagnostic mammogram and breast ultrasound on July 29, 2023. The imaging revealed a 15 mm irregular mass at the three o'clock position in the left breast. No abnormal lymph nodes were detected on the ultrasound.A biopsy of the mass confirmed invasive ductal carcinoma, grade 1 with 100% ER receptor positivity, and HER2 negativity of 0. She has a history of breast cancer over twenty years ago, for which she underwent a lumpectomy followed by radiation therapy.  She never took any hormone therapy post-treatment.  She is currently on warfarin, for her history of pulmonary embolism, and Lasix, for her congestive heart failure.  Per daughter cardiology has commented that it is high risk for patient to proceed with surgery.  She is a family history of breast cancer involving both her mother and sisters, she began menstruating at age 13.  She is gravida 4 para 4.  She took both oral birth control and estrogen replacement therapy when she was younger.  Clearly postmenopausal with a history of hysterectomy.   Past Medical History Past Medical History:  Diagnosis Date   Cancer (HCC)    breast   Graves disease    Hypertension    Osteoporosis    Thyroid  disease       Past Surgical History:  Procedure Laterality Date   BACK SURGERY     X3   BREAST BIOPSY Left 08/05/2023   US  LT BREAST BX W LOC DEV 1ST LESION IMG BX SPEC US  GUIDE 08/05/2023 [facility]   BREAST LUMPECTOMY Left    @ 2000   CATARACT EXTRACTION W/PHACO Left 02/18/2018    Procedure: CATARACT EXTRACTION PHACO AND INTRAOCULAR LENS PLACEMENT (IOC) LEFT;  Surgeon: Mittie Gaskin, MD;  Location: [facility];  Service: Ophthalmology;  Laterality: Left;   CATARACT EXTRACTION W/PHACO Right 03/17/2018   Procedure: CATARACT EXTRACTION PHACO AND INTRAOCULAR LENS PLACEMENT (IOC)  RIGHT;  Surgeon: Mittie Gaskin, MD;  Location: [facility];  Service: Ophthalmology;  Laterality: Right;   ELBOW SURGERY Right    HAND SURGERY Right    JOINT REPLACEMENT Bilateral    Both knees, left shoulder   ROTATOR CUFF REPAIR Bilateral     Allergies  Allergen Reactions   Penicillins Anaphylaxis and Shortness Of Breath   Gabapentin Other (See Comments) and Nausea And Vomiting   Morphine And Codeine Other (See Comments)    AMS   Sulfa Antibiotics Other (See Comments)    Tongue and lip swelling   Amitriptyline Other (See Comments)    Yeast infection   Buspirone Other (See Comments)    Increased anxiety, agitation   Latex Rash    Pt states it is ok to use briefly   Morphine Other (See Comments)    Headache    Current Outpatient Medications  Medication Sig Dispense Refill   acetaminophen  (TYLENOL ) 500 MG tablet Take 1,000 mg by mouth every 8 (eight) hours as needed.     Cyanocobalamin (VITAMIN B 12 PO) Take 1 tablet by mouth daily.  furosemide (LASIX) 40 MG tablet Take 40 mg by mouth.     JARDIANCE 10 MG TABS tablet Take 10 mg by mouth daily.     levothyroxine  (SYNTHROID ) 112 MCG tablet Take 112 mcg by mouth daily before breakfast.     Melatonin 3 MG CAPS Take 2 tablets by mouth daily as needed.     pantoprazole (PROTONIX) 40 MG tablet Take 40 mg by mouth every morning.     ramipril  (ALTACE ) 10 MG capsule Take 10 mg by mouth daily.     traZODone (DESYREL) 100 MG tablet Take 150 mg by mouth at bedtime.     venlafaxine  XR (EFFEXOR -XR) 75 MG 24 hr capsule Take 3 capsules (225 mg total) by mouth daily with breakfast. 90 capsule 0   vitamin C   (ASCORBIC ACID ) 500 MG tablet Take 500 mg by mouth daily.     Vitamin D, Ergocalciferol, (DRISDOL) 1.25 MG (50000 UNIT) CAPS capsule Take 50,000 Units by mouth every 30 (thirty) days.     warfarin (COUMADIN) 5 MG tablet Take one tablet, 5mg , everyday     No current facility-administered medications for this visit.   Facility-Administered Medications Ordered in Other Visits  Medication Dose Route Frequency Provider Last Rate Last Admin   albuterol  (PROVENTIL ) (2.5 MG/3ML) 0.083% nebulizer solution 2.5 mg  2.5 mg Nebulization Once Tamea Dedra CROME, MD        Family History Family History  Problem Relation Age of Onset   Breast cancer Mother 55 - 66   Cancer Mother    Heart attack Father    Breast cancer Sister 7 - 50   Cancer Sister    Heart disease Sister    Hyperlipidemia Sister    Heart attack Sister    Breast cancer Sister 75 - 61   Breast cancer Sister 75 - 44   Cancer Daughter    Cancer Brother       Social History Social History   Tobacco Use   Smoking status: Former    Current packs/day: 0.00    Types: Cigarettes    Quit date: 01/08/1987    Years since quitting: 36.6   Smokeless tobacco: Never   Tobacco comments:    Quit 1989  Substance Use Topics   Alcohol use: No   Drug use: No        Review of Systems  Constitutional: Negative.   HENT:  Positive for hearing loss.   Eyes: Negative.   Respiratory:  Positive for cough, sputum production, shortness of breath and stridor.   Cardiovascular:  Positive for chest pain, palpitations, orthopnea, claudication and leg swelling.  Gastrointestinal:  Positive for constipation, heartburn and nausea.  Genitourinary:  Positive for frequency.  Skin: Negative.   Neurological:  Positive for dizziness, tingling and headaches.  Psychiatric/Behavioral: Negative.       Physical Exam Blood pressure 127/74, pulse 68, temperature 97.8 F (36.6 C), temperature source Oral, height 4' 11 (1.499 m), weight 190 lb (86.2 kg),  SpO2 94%. Last Weight  Most recent update: 08/26/2023 11:03 AM    Weight  86.2 kg (190 lb)             I have reviewed previous examinations by other physicians, and as this patient is in a wheelchair I did not trouble her to transfer to exam table and repeat a thorough physical examination in light of spending more time in discussion of the pros and cons of considering surgery at this time.    CONSTITUTIONAL: Well developed,  and nourished, appropriately responsive and aware without distress.   EYES: Sclera non-icteric.   EARS, NOSE, MOUTH AND THROAT:   Hearing is intact to voice.  NECK: Trachea is midline, and there are no cervical or supraclavicular masses LYMPH NODES:  Lymph nodes in the neck are not appreciated. RESPIRATORY:   Normal respiratory effort without pathologic use of accessory muscles. CARDIOVASCULAR: Well perfused.  MUSCULOSKELETAL: Sitting upright in wheelchair. NEUROLOGIC:  Cranial nerves are grossly without defect. PSYCH:  Alert and oriented to person, place and time. Affect is appropriate for situation.  Data Reviewed I have personally reviewed what is currently available of the patient's imaging, recent labs and medical records.   Labs:     Latest Ref Rng & Units 01/28/2023    4:45 AM 01/27/2023   10:49 AM 06/25/2021    1:47 PM  CBC  WBC 4.0 - 10.5 K/uL 5.6  6.2  5.2   Hemoglobin 12.0 - 15.0 g/dL 86.5  86.7  87.0   Hematocrit 36.0 - 46.0 % 40.2  39.6  40.7   Platelets 150 - 400 K/uL 207  209  218       Latest Ref Rng & Units 01/27/2023   10:49 AM 06/25/2021    1:47 PM 05/20/2020   10:06 AM  CMP  Glucose 70 - 99 mg/dL 880  882  893   BUN 8 - 23 mg/dL 20  14  17    Creatinine 0.44 - 1.00 mg/dL 8.87  9.19  8.77   Sodium 135 - 145 mmol/L 135  142  135   Potassium 3.5 - 5.1 mmol/L 4.6  3.8  3.7   Chloride 98 - 111 mmol/L 103  108  98   CO2 22 - 32 mmol/L 23  27  29    Calcium  8.9 - 10.3 mg/dL 8.4  9.2  8.9   Total Protein 6.5 - 8.1 g/dL 6.3   6.7   Total  Bilirubin 0.0 - 1.2 mg/dL 0.7   1.1   Alkaline Phos 38 - 126 U/L 45   56   AST 15 - 41 U/L 22   24   ALT 0 - 44 U/L 21   19    FINAL DIAGNOSIS       1. Breast, left, needle core biopsy, 3 o'clock, 1cmfn, retro (ribbon clip) :      - INVASIVE DUCTAL CARCINOMA, SEE NOTE      - TUBULE FORMATION: SCORE 2/3      - NUCLEAR PLEOMORPHISM: SCORE 2/3      - MITOTIC COUNT: SCORE 1/3      - TOTAL SCORE: 5/9      - OVERALL GRADE: I/III      - LYMPHOVASCULAR INVASION: NOT IDENTIFIED      - CANCER LENGTH: 11 MM IN GREATEST LINEAR DIMENSION      - CALCIFICATIONS: PRESENT      - OTHER FINDINGS: N/A      NOTE:      DR. REBBECCA REVIEWED THE CASE AND CONCURS WITH THE INTERPRETATION.  A BREAST      PROGNOSTIC PROFILE (ER, PR, KI-67 AND HER2) IS PENDING AND WILL BE REPORTED IN      AN ADDENDUM.  LINDA NASH WAS NOTIFIED ON 08/06/2023. MICROSCOPIC DESCRIPTION ADDENDUM Breast, left, needle core biopsy, 3 o'clock, 1 cmfn, retro (ribbon clip) PROGNOSTIC INDICATORS  Results: IMMUNOHISTOCHEMICAL AND MORPHOMETRIC ANALYSIS PERFORMED MANUALLY The tumor cells are NEGATIVE for Her2 (0+). Estrogen Receptor:  100%, POSITIVE, STRONG STAINING INTENSITY Progesterone Receptor:  100%, POSITIVE, STRONG STAINING INTENSITY Proliferation Marker Ki67:  3% REFERENCE RANGE ESTROGEN RECEPTOR NEGATIVE     0% POSITIVE       =>1% REFERENCE RANGE PROGESTERONE RECEPTOR NEGATIVE     0% POSITIVE        =>1% All controls stained appropriately [place] Md, Fred , Sports administrator, International aid/development worker (Results Signed [phone])  Imaging: Radiological images personally reviewed:  CLINICAL DATA:  89 year old female presenting with focal pain in the LEFT breast. History of LEFT breast lumpectomy in 2000.   EXAM: DIGITAL DIAGNOSTIC BILATERAL MAMMOGRAM WITH TOMOSYNTHESIS AND CAD; ULTRASOUND LEFT BREAST LIMITED   TECHNIQUE: Bilateral digital diagnostic mammography and breast tomosynthesis was performed. The images were  evaluated with computer-aided detection. ; Targeted ultrasound examination of the left breast was performed.   COMPARISON:  Previous exam(s).   ACR Breast Density Category b: There are scattered areas of fibroglandular density.   FINDINGS: MAMMOGRAM:   Right:   A questioned asymmetry in the retroareolar right breast does not persist on additional spot tomosynthesis views, consistent with superimposed fibroglandular tissue.   No new suspicious mass, calcification, or other findings are identified in the right breast.   Left:   Diagnostic mammographic images were obtained over the area of focal pain in the left breast. No suspicious mammographic finding is identified in this area.   Re-demonstrated postlumpectomy changes in upper-outer left breast middle to posterior depth. There is a new 18 mm spiculated mass with associated dystrophic calcification located anterior to the lumpectomy bed in the outer central left breast anterior depth (spot CC image 40/55, spot MLO image 65/76).   No additional suspicious findings are identified in the left breast.   ULTRASOUND:   Targeted left breast ultrasound was performed:   At 3 o'clock 1 cm from the nipple, there is an irregular hypoechoic mass with moderate internal vascularity. There is an echogenic focus within the mass, corresponding with the dystrophic calcifications seen on mammogram. It measures 15 x 11 x 14 mm. This corresponds with the spiculated mass seen on mammogram.   Targeted left breast ultrasound was performed in the area of focal pain from 9-10 o'clock 12 cm from nipple. This demonstrates normal fibroglandular tissue.   Targeted left axillary ultrasound demonstrates a morphologically benign lymph node.   IMPRESSION: 1. LEFT breast 15 mm irregular mass in the 3 o'clock position is suspicious for malignancy. Recommend further assessment with ultrasound-guided biopsy. 2. No mammographic or sonographic  abnormality in the area of focal pain in the inner LEFT breast. 3. No LEFT axillary lymphadenopathy. 4. No mammographic evidence of malignancy in the RIGHT breast.   RECOMMENDATION: LEFT breast ultrasound-guided biopsy (1 site).   I have discussed the findings and recommendations with the patient. The biopsy procedure was discussed with the patient and questions were answered. Patient expressed their understanding of the biopsy recommendation. Patient will be scheduled for biopsy at her earliest convenience by the schedulers. Ordering provider will be notified. If applicable, a reminder letter will be sent to the patient regarding the next appointment.   BI-RADS CATEGORY  4: Suspicious.     Electronically Signed   By: Dirk Arrant M.D.   On: 07/29/2023 15:51 Within last 24 hrs: No results found.  Assessment    Low-grade recurrent left breast cancer, strongly ER positive HER2 negative, without evidence of axillary adenopathy. Patient Active Problem List   Diagnosis Date Noted   Invasive ductal carcinoma of breast, female, left (HCC) 08/13/2023   Osteoporosis 08/13/2023  Cardiomyopathy (HCC) 08/13/2023   Family history of cancer 08/13/2023   Primary hypertension 01/28/2023   Acquired hypothyroidism 01/28/2023   Depression 01/28/2023   PE (pulmonary thromboembolism) (HCC) 01/27/2023   Chronic pain syndrome 05/25/2020   Primary osteoarthritis of left shoulder 05/25/2020   Lumbar degenerative disc disease 05/25/2020   Morbid obesity with BMI of 40.0-44.9, adult (HCC) 11/20/2017   S/P carpal tunnel release 05/28/2017   Radiculopathy of cervical region 03/05/2017   Status post right hip replacement 05/23/2016   Stage 3a chronic kidney disease (HCC) 05/09/2016   Carpal tunnel syndrome of left wrist 06/03/2014   Chronic right shoulder pain 06/18/2013   Aortic atherosclerosis (HCC) 02/12/2013   Abdominal pain, chronic, right lower quadrant 02/12/2013   Weakness of limb  02/12/2013   Spinal stenosis, lumbar region, with neurogenic claudication 09/07/2010    Plan    I expressed to her the risks of surgery far outweigh any again that she would have by having an operation.  I believe especially in light of the fact that she never had tamoxifen in the past, that this would be the least risky option, and of sufficient benefit considering her associated comorbidities, and anticipated lifespan.  I would not offer her an operation.  I am in the understanding that this is concurs with the patient's daughter's opinion, and I believe she is now willing to accept this advice.  I appreciate the opportunity to meet this pleasant lady.  And I wish her all the best in the days to come, and if I could be of any further help, I remain readily available.   I personally spent a total of 50 minutes in the care of the patient today including preparing to see the patient, getting/reviewing separately obtained history, counseling and educating, documenting clinical information in the EHR, independently interpreting results, and communicating results.   These notes generated with voice recognition software. I apologize for typographical errors.  Honor Leghorn M.D., FACS 08/26/2023, 11:38 AM

## 2023-08-26 ENCOUNTER — Ambulatory Visit (INDEPENDENT_AMBULATORY_CARE_PROVIDER_SITE_OTHER): Payer: No Typology Code available for payment source | Admitting: Surgery

## 2023-08-26 ENCOUNTER — Encounter: Payer: Self-pay | Admitting: Surgery

## 2023-08-26 VITALS — BP 127/74 | HR 68 | Temp 97.8°F | Ht 59.0 in | Wt 190.0 lb

## 2023-08-26 DIAGNOSIS — C50912 Malignant neoplasm of unspecified site of left female breast: Secondary | ICD-10-CM

## 2023-08-26 DIAGNOSIS — I429 Cardiomyopathy, unspecified: Secondary | ICD-10-CM

## 2023-08-26 DIAGNOSIS — Z170 Estrogen receptor positive status [ER+]: Secondary | ICD-10-CM | POA: Diagnosis not present

## 2023-08-26 DIAGNOSIS — I2699 Other pulmonary embolism without acute cor pulmonale: Secondary | ICD-10-CM

## 2023-08-26 DIAGNOSIS — C50812 Malignant neoplasm of overlapping sites of left female breast: Secondary | ICD-10-CM | POA: Diagnosis not present

## 2023-08-27 ENCOUNTER — Encounter: Payer: Self-pay | Admitting: *Deleted

## 2023-08-27 ENCOUNTER — Inpatient Hospital Stay

## 2023-08-27 NOTE — Progress Notes (Signed)
 Cynthia Raymond daughter Cynthia Raymond called and they would like to proceed with endocrine therapy alone.  They do not want to pursue radiation either.  Dr. Babara will see Cynthia Raymond back after her bone density scan to discuss endocrine therapy further.

## 2023-09-10 ENCOUNTER — Inpatient Hospital Stay: Attending: Oncology | Admitting: Hospice and Palliative Medicine

## 2023-09-10 DIAGNOSIS — Z87891 Personal history of nicotine dependence: Secondary | ICD-10-CM | POA: Insufficient documentation

## 2023-09-10 DIAGNOSIS — Z803 Family history of malignant neoplasm of breast: Secondary | ICD-10-CM | POA: Insufficient documentation

## 2023-09-10 DIAGNOSIS — Z1732 Human epidermal growth factor receptor 2 negative status: Secondary | ICD-10-CM | POA: Insufficient documentation

## 2023-09-10 DIAGNOSIS — Z79811 Long term (current) use of aromatase inhibitors: Secondary | ICD-10-CM | POA: Insufficient documentation

## 2023-09-10 DIAGNOSIS — Z79899 Other long term (current) drug therapy: Secondary | ICD-10-CM | POA: Insufficient documentation

## 2023-09-10 DIAGNOSIS — C50912 Malignant neoplasm of unspecified site of left female breast: Secondary | ICD-10-CM

## 2023-09-10 DIAGNOSIS — C50812 Malignant neoplasm of overlapping sites of left female breast: Secondary | ICD-10-CM | POA: Insufficient documentation

## 2023-09-10 DIAGNOSIS — M810 Age-related osteoporosis without current pathological fracture: Secondary | ICD-10-CM | POA: Insufficient documentation

## 2023-09-10 DIAGNOSIS — Z1721 Progesterone receptor positive status: Secondary | ICD-10-CM | POA: Insufficient documentation

## 2023-09-10 DIAGNOSIS — Z170 Estrogen receptor positive status [ER+]: Secondary | ICD-10-CM | POA: Insufficient documentation

## 2023-09-10 NOTE — Progress Notes (Signed)
 Multidisciplinary Oncology Council Documentation  Bettye Sitton was presented by our [facility] on 09/10/2023, which included representatives from:  Palliative Care Dietitian  Physical/Occupational Therapist Nurse Navigator Genetics Social work Survivorship RN Financial Navigator Research RN   Kymorah currently presents with history of breast cancer  We reviewed previous medical and familial history, history of present illness, and recent lab results along with all available histopathologic and imaging studies. The MOC considered available treatment options and made the following recommendations/referrals:  None currently  The MOC is a meeting of clinicians from various specialty areas who evaluate and discuss patients for whom a multidisciplinary approach is being considered. Final determinations in the plan of care are those of the provider(s).   Today's extended care, comprehensive team conference, Kirsten was not present for the discussion and was not examined.

## 2023-09-16 ENCOUNTER — Ambulatory Visit
Admission: RE | Admit: 2023-09-16 | Discharge: 2023-09-16 | Disposition: A | Discharge status: Home / Self Care | Source: Ambulatory Visit | Attending: Oncology | Admitting: Oncology

## 2023-09-16 DIAGNOSIS — C50912 Malignant neoplasm of unspecified site of left female breast: Secondary | ICD-10-CM | POA: Insufficient documentation

## 2023-09-16 DIAGNOSIS — M8589 Other specified disorders of bone density and structure, multiple sites: Secondary | ICD-10-CM | POA: Diagnosis not present

## 2023-09-16 DIAGNOSIS — Z13820 Encounter for screening for osteoporosis: Secondary | ICD-10-CM | POA: Diagnosis not present

## 2023-09-23 ENCOUNTER — Encounter: Payer: Self-pay | Admitting: Oncology

## 2023-09-23 ENCOUNTER — Inpatient Hospital Stay (HOSPITAL_BASED_OUTPATIENT_CLINIC_OR_DEPARTMENT_OTHER): Admitting: Oncology

## 2023-09-23 ENCOUNTER — Encounter: Payer: Self-pay | Admitting: *Deleted

## 2023-09-23 VITALS — BP 140/69 | HR 68 | Temp 98.4°F | Resp 18 | Wt 191.5 lb

## 2023-09-23 DIAGNOSIS — Z809 Family history of malignant neoplasm, unspecified: Secondary | ICD-10-CM | POA: Diagnosis not present

## 2023-09-23 DIAGNOSIS — Z170 Estrogen receptor positive status [ER+]: Secondary | ICD-10-CM | POA: Diagnosis not present

## 2023-09-23 DIAGNOSIS — M8580 Other specified disorders of bone density and structure, unspecified site: Secondary | ICD-10-CM

## 2023-09-23 DIAGNOSIS — Z87891 Personal history of nicotine dependence: Secondary | ICD-10-CM | POA: Diagnosis not present

## 2023-09-23 DIAGNOSIS — Z79811 Long term (current) use of aromatase inhibitors: Secondary | ICD-10-CM | POA: Diagnosis not present

## 2023-09-23 DIAGNOSIS — C50912 Malignant neoplasm of unspecified site of left female breast: Secondary | ICD-10-CM

## 2023-09-23 DIAGNOSIS — Z79899 Other long term (current) drug therapy: Secondary | ICD-10-CM | POA: Diagnosis not present

## 2023-09-23 DIAGNOSIS — Z1732 Human epidermal growth factor receptor 2 negative status: Secondary | ICD-10-CM | POA: Diagnosis not present

## 2023-09-23 DIAGNOSIS — M810 Age-related osteoporosis without current pathological fracture: Secondary | ICD-10-CM | POA: Diagnosis not present

## 2023-09-23 DIAGNOSIS — Z1721 Progesterone receptor positive status: Secondary | ICD-10-CM | POA: Diagnosis not present

## 2023-09-23 DIAGNOSIS — Z803 Family history of malignant neoplasm of breast: Secondary | ICD-10-CM | POA: Diagnosis not present

## 2023-09-23 DIAGNOSIS — C50812 Malignant neoplasm of overlapping sites of left female breast: Secondary | ICD-10-CM | POA: Diagnosis present

## 2023-09-23 MED ORDER — ANASTROZOLE 1 MG PO TABS: 1.0000 mg | ORAL_TABLET | Freq: Every day | ORAL | 3 refills | Status: DC

## 2023-09-23 NOTE — Assessment & Plan Note (Addendum)
 Left breast invasive ductal carcinoma, G2, ER100%+, PR 100%+, HER2 0+, Ki67 3% cT1c  Patient and daughter have elected not to proceed with surgery and radiation. They are in endocrine therapy only.  They understand the endocrine therapy is a palliative treatment, rather than a definitive treatment  Rationale of using aromatase inhibitor -Arimidex   discussed with patient.  Side effects of Arimidex  including but not limited to hot flash, muscle and joint pain, fatigue, mood swing, vaginal dryness/inching, loss of bone mineral density,hair thinning, heart problem, etc were discussed with patient.  Patient voices understanding and willing to proceed.  Rx was sent to pharmacy

## 2023-09-23 NOTE — Assessment & Plan Note (Signed)
 DEXA showed osteopenia, T score -1.4.  Recommend calcium  and vitamin D supplementation.

## 2023-09-23 NOTE — Assessment & Plan Note (Signed)
Patient declined genetic testing. 

## 2023-09-23 NOTE — Progress Notes (Signed)
 Hematology/Oncology Consult Note [phone] [phone]     REFERRING PROVIDER: Alla Amis, MD    CHIEF COMPLAINTS/PURPOSE OF CONSULTATION:  Left breast cancer.  ASSESSMENT & PLAN:   Invasive ductal carcinoma of breast, female, left (HCC) Left breast invasive ductal carcinoma, G2, ER100%+, PR 100%+, HER2 0+, Ki67 3% cT1c  Patient and daughter have elected not to proceed with surgery and radiation. They are in endocrine therapy only.  They understand the endocrine therapy is a palliative treatment, rather than a definitive treatment  Rationale of using aromatase inhibitor -Arimidex   discussed with patient.  Side effects of Arimidex  including but not limited to hot flash, muscle and joint pain, fatigue, mood swing, vaginal dryness/inching, loss of bone mineral density,hair thinning, heart problem, etc were discussed with patient.  Patient voices understanding and willing to proceed.  Rx was sent to pharmacy   Family history of cancer Patient declined genetic testing.  Osteopenia DEXA showed osteopenia, T score -1.4.  Recommend calcium  and vitamin D supplementation.     Orders Placed This Encounter  Procedures   CBC with Differential (Cancer Center Only)    Standing Status:   Future    Expected Date:   11/23/2023    Expiration Date:   02/21/2024   CMP (Cancer Center only)    Standing Status:   Future    Expected Date:   11/23/2023    Expiration Date:   02/21/2024   Follow up to be determined. All questions were answered. The patient knows to call the clinic with any problems, questions or concerns.  Zelphia Cap, MD, PhD [facility] Hematology Oncology 09/23/2023    HISTORY OF PRESENTING ILLNESS:  Cynthia Raymond 89 y.o. female presents to establish care for left breast cancer. I have reviewed her chart and materials related to her cancer extensively and collaborated history with the patient. Summary of oncologic history is as  follows: Oncology History  Invasive ductal carcinoma of breast, female, left (HCC)  07/29/2023 Imaging   Bilateral diagnostic breast mammogram showed  1. LEFT breast 15 mm irregular mass in the 3 o'clock position is suspicious for malignancy. Recommend further assessment with ultrasound-guided biopsy. 2. No mammographic or sonographic abnormality in the area of focal pain in the inner LEFT breast. 3. No LEFT axillary lymphadenopathy. 4. No mammographic evidence of malignancy in the RIGHT breast.     08/05/2023 Initial Diagnosis   invasive ductal carcinoma of breast, female, left Patient has felt left breast achiness.  Patient underwent left breast biopsy  1. Breast, left, needle core biopsy, 3 o'clock, 1cmfn, retro (ribbon clip) :       - INVASIVE DUCTAL CARCINOMA, SEE NOTE       - TUBULE FORMATION: SCORE 2/3       - NUCLEAR PLEOMORPHISM: SCORE 2/3       - MITOTIC COUNT: SCORE 1/3       - TOTAL SCORE: 5/9       - OVERALL GRADE: I/III       - LYMPHOVASCULAR INVASION: NOT IDENTIFIED       - CANCER LENGTH: 11 MM IN GREATEST LINEAR DIMENSION       - CALCIFICATIONS: PRESENT .  ER 100% positive, PR 100% positive, HER2 - (0+)   08/13/2023 Cancer Staging   Staging form: Breast, AJCC 8th Edition - Clinical stage from 08/13/2023: Stage IA (cT1c, cN0, cM0, G2, ER+, PR+, HER2-) - Signed by Cap Zelphia, MD on 08/13/2023 Stage prefix: Initial diagnosis Histologic grading system: 3 grade  system    Patient reports a remote history of left Breast cancer which was treated in 2001.  She underwent lumpectomy, radiation and endocrine therapy with tamoxifen was initiated and stopped.  Length of treatment was unclear.  Patient is accompanied by her daughter. Patient has history of CHF, cardiomyopathy and follows up with cardiology. Patient and daughter have seeked 2nd opinion with Dr. Lane. They have met with Radonc Dr. Lenn as well. They have elected not to get surgery or radiation.   Patient  complains about intermittent left breast pain. SABRA    MEDICAL HISTORY:  Past Medical History:  Diagnosis Date   Cancer (HCC)    breast   Graves disease    Hypertension    Osteoporosis    Thyroid  disease     SURGICAL HISTORY: Past Surgical History:  Procedure Laterality Date   BACK SURGERY     X3   BREAST BIOPSY Left 08/05/2023   US  LT BREAST BX W LOC DEV 1ST LESION IMG BX SPEC US  GUIDE 08/05/2023 [facility]   BREAST LUMPECTOMY Left    @ 2000   CATARACT EXTRACTION W/PHACO Left 02/18/2018   Procedure: CATARACT EXTRACTION PHACO AND INTRAOCULAR LENS PLACEMENT (IOC) LEFT;  Surgeon: Mittie Gaskin, MD;  Location: [facility];  Service: Ophthalmology;  Laterality: Left;   CATARACT EXTRACTION W/PHACO Right 03/17/2018   Procedure: CATARACT EXTRACTION PHACO AND INTRAOCULAR LENS PLACEMENT (IOC)  RIGHT;  Surgeon: Mittie Gaskin, MD;  Location: [facility];  Service: Ophthalmology;  Laterality: Right;   ELBOW SURGERY Right    HAND SURGERY Right    JOINT REPLACEMENT Bilateral    Both knees, left shoulder   ROTATOR CUFF REPAIR Bilateral     SOCIAL HISTORY: Social History   Socioeconomic History   Marital status: Widowed    Spouse name: Earle   Number of children: 4   Years of education: 12   Highest education level: Not on file  Occupational History   Not on file  Tobacco Use   Smoking status: Former    Current packs/day: 0.00    Types: Cigarettes    Quit date: 01/08/1987    Years since quitting: 36.7   Smokeless tobacco: Never   Tobacco comments:    Quit 1989  Substance and Sexual Activity   Alcohol use: No   Drug use: No   Sexual activity: Not on file  Other Topics Concern   Not on file  Social History Narrative   Patient is married to [place], has 4 children, 1 deceased   Patient is right handed   Education level is 12   Caffeine consumption is 2 cups daily         Social Drivers of Corporate investment banker Strain: Low Risk   (09/10/2023)   Received from [facility]   Overall Financial Resource Strain (CARDIA)    Difficulty of Paying Living Expenses: Not hard at all  Food Insecurity: No Food Insecurity (09/10/2023)   Received from [facility]   Hunger Vital Sign    Within the past 12 months, you worried that your food would run out before you got the money to buy more.: Never true    Within the past 12 months, the food you bought just didn't last and you didn't have money to get more.: Never true  Transportation Needs: No Transportation Needs (09/10/2023)   Received from [facility] - Transportation    In the past 12  months, has lack of transportation kept you from medical appointments or from getting medications?: No    Lack of Transportation (Non-Medical): No  Physical Activity: Unknown (04/08/2018)   Received from [facility]   Exercise Vital Sign    On average, how many days per week do you engage in moderate to strenuous exercise (like a brisk walk)?: 0 days    Minutes of Exercise per Session: Not on file  Stress: No Stress Concern Present (04/08/2018)   Received from [facility] - Occupational Stress Questionnaire    Feeling of Stress : Only a little  Social Connections: Socially Isolated (01/27/2023)   Social Connection and Isolation Panel    Frequency of Communication with Friends and Family: More than three times a week    Frequency of Social Gatherings with Friends and Family: Twice a week    Attends Religious Services: Never    Database administrator or Organizations: No    Attends Banker Meetings: Never    Marital Status: Widowed  Intimate Partner Violence: Not At Risk (01/27/2023)   Humiliation, Afraid, Rape, and Kick questionnaire    Fear of Current or Ex-Partner: No    Emotionally Abused: No    Physically Abused: No    Sexually Abused: No     FAMILY HISTORY: Family History  Problem Relation Age of Onset   Breast cancer Mother 64 - 17   Cancer Mother    Heart attack Father    Breast cancer Sister 71 - 21   Cancer Sister    Heart disease Sister    Hyperlipidemia Sister    Heart attack Sister    Breast cancer Sister 40 - 10   Breast cancer Sister 18 - 15   Cancer Daughter    Cancer Brother     ALLERGIES:  is allergic to penicillins, gabapentin, morphine and codeine, sulfa antibiotics, amitriptyline, buspirone, latex, and morphine.  MEDICATIONS:  Current Outpatient Medications  Medication Sig Dispense Refill   acetaminophen  (TYLENOL ) 500 MG tablet Take 1,000 mg by mouth every 8 (eight) hours as needed.     anastrozole  (ARIMIDEX ) 1 MG tablet Take 1 tablet (1 mg total) by mouth daily. 30 tablet 3   Cyanocobalamin (VITAMIN B 12 PO) Take 1 tablet by mouth daily.     furosemide (LASIX) 40 MG tablet Take 40 mg by mouth.     JARDIANCE 10 MG TABS tablet Take 10 mg by mouth daily.     levothyroxine  (SYNTHROID ) 112 MCG tablet Take 112 mcg by mouth daily before breakfast.     Melatonin 3 MG CAPS Take 2 tablets by mouth daily as needed.     pantoprazole (PROTONIX) 40 MG tablet Take 40 mg by mouth every morning.     ramipril  (ALTACE ) 10 MG capsule Take 10 mg by mouth daily.     traZODone (DESYREL) 100 MG tablet Take 150 mg by mouth at bedtime.     venlafaxine  XR (EFFEXOR -XR) 75 MG 24 hr capsule Take 3 capsules (225 mg total) by mouth daily with breakfast. 90 capsule 0   vitamin C  (ASCORBIC ACID ) 500 MG tablet Take 500 mg by mouth daily.     Vitamin D, Ergocalciferol, (DRISDOL) 1.25 MG (50000 UNIT) CAPS capsule Take 50,000 Units by mouth every 30 (thirty) days.     warfarin (COUMADIN) 5 MG tablet Take one tablet, 5mg , everyday     No current facility-administered medications for this visit.  Facility-Administered Medications Ordered in Other Visits  Medication Dose Route Frequency Provider Last Rate Last Admin   albuterol   (PROVENTIL ) (2.5 MG/3ML) 0.083% nebulizer solution 2.5 mg  2.5 mg Nebulization Once Tamea Dedra CROME, MD        Review of Systems  Constitutional:  Negative for appetite change, chills, fatigue and fever.  HENT:   Negative for hearing loss and voice change.   Eyes:  Negative for eye problems.  Respiratory:  Negative for chest tightness and cough.   Cardiovascular:  Negative for chest pain.  Gastrointestinal:  Negative for abdominal distention, abdominal pain and blood in stool.  Endocrine: Negative for hot flashes.  Genitourinary:  Negative for difficulty urinating and frequency.   Musculoskeletal:  Negative for arthralgias.  Skin:  Negative for itching and rash.  Neurological:  Negative for extremity weakness.  Hematological:  Negative for adenopathy.  Psychiatric/Behavioral:  Negative for confusion.    Left breast pain.  PHYSICAL EXAMINATION: ECOG PERFORMANCE STATUS: 2 - Symptomatic, <50% confined to bed  Vitals:   09/23/23 1308  BP: (!) 140/69  Pulse: 68  Resp: 18  Temp: 98.4 F (36.9 C)  SpO2: 98%   Filed Weights   09/23/23 1308  Weight: 191 lb 8 oz (86.9 kg)    Physical Exam Constitutional:      General: She is not in acute distress.    Appearance: She is not diaphoretic.     Comments: Frail appearance elderly female, sits in the wheelchair.  HENT:     Head: Normocephalic and atraumatic.  Eyes:     General: No scleral icterus. Cardiovascular:     Rate and Rhythm: Normal rate and regular rhythm.     Heart sounds: No murmur heard. Pulmonary:     Effort: Pulmonary effort is normal. No respiratory distress.     Breath sounds: Normal breath sounds.  Abdominal:     General: Bowel sounds are normal. There is no distension.     Palpations: Abdomen is soft.  Musculoskeletal:        General: Normal range of motion.     Cervical back: Normal range of motion and neck supple.  Skin:    General: Skin is warm and dry.     Findings: No erythema.  Neurological:      Mental Status: She is alert and oriented to person, place, and time. Mental status is at baseline.     Motor: No abnormal muscle tone.  Psychiatric:        Mood and Affect: Affect normal.    Breast exam was performed in seated and lying down position. Patient is status post left breast lumpectomy with tissue thickening at the surgical sites. No palpable mass in the right breast.  No palpable axillary adenopathy bilaterally.   LABORATORY DATA:  I have reviewed the data as listed    Latest Ref Rng & Units 01/28/2023    4:45 AM 01/27/2023   10:49 AM 06/25/2021    1:47 PM  CBC  WBC 4.0 - 10.5 K/uL 5.6  6.2  5.2   Hemoglobin 12.0 - 15.0 g/dL 86.5  86.7  87.0   Hematocrit 36.0 - 46.0 % 40.2  39.6  40.7   Platelets 150 - 400 K/uL 207  209  218       Latest Ref Rng & Units 01/27/2023   10:49 AM 06/25/2021    1:47 PM 05/20/2020   10:06 AM  CMP  Glucose 70 - 99 mg/dL 880  882  893  BUN 8 - 23 mg/dL 20  14  17    Creatinine 0.44 - 1.00 mg/dL 8.87  9.19  8.77   Sodium 135 - 145 mmol/L 135  142  135   Potassium 3.5 - 5.1 mmol/L 4.6  3.8  3.7   Chloride 98 - 111 mmol/L 103  108  98   CO2 22 - 32 mmol/L 23  27  29    Calcium  8.9 - 10.3 mg/dL 8.4  9.2  8.9   Total Protein 6.5 - 8.1 g/dL 6.3   6.7   Total Bilirubin 0.0 - 1.2 mg/dL 0.7   1.1   Alkaline Phos 38 - 126 U/L 45   56   AST 15 - 41 U/L 22   24   ALT 0 - 44 U/L 21   19      RADIOGRAPHIC STUDIES: I have personally reviewed the radiological images as listed and agreed with the findings in the report. DG Bone Density Result Date: 09/17/2023 EXAM: DUAL X-RAY ABSORPTIOMETRY (DXA) FOR BONE MINERAL DENSITY 09/16/2023 1:21 pm CLINICAL DATA:  89 year old Female Postmenopausal. Breast cancer TECHNIQUE: An axial (e.g., hips, spine) and/or appendicular (e.g., radius) exam was performed, as appropriate, using GE Secretary/administrator at [facility]. Images are obtained for bone mineral density measurement and are not obtained  for diagnostic purposes. [id number] Exclusions: Lumbar spine and right hip due to surgery. COMPARISON:  None. New baseline. FINDINGS: Scan quality: Good. LEFT FEMORAL NECK: BMD (in g/cm2): 0.851 T-score: -1.3 Z-score: 1.4 LEFT TOTAL HIP: BMD (in g/cm2): 0.927 T-score: -0.6 Z-score: 2.0 LEFT FOREARM (RADIUS 33%): BMD (in g/cm2): 0.859 T-score: -0.2 Z-score: 3.6 FRAX 10-YEAR PROBABILITY OF FRACTURE: FRAX not reported as the patient's age is outside the parameters of the FRAX risk tool used by this practice. IMPRESSION: Osteopenia based on BMD. Fracture risk is increased. Increased risk is based on low BMD. RECOMMENDATIONS: 1. All patients should optimize calcium  and vitamin D intake. 2. Consider FDA-approved medical therapies in postmenopausal women and men aged 17 years and older, based on the following: - A hip or vertebral (clinical or morphometric) fracture - T-score less than or equal to -2.5 and secondary causes have been excluded. - Low bone mass (T-score between -1.0 and -2.5) and a 10-year probability of a hip fracture greater than or equal to 3% or a 10-year probability of a major osteoporosis-related fracture greater than or equal to 20% based on the [place] -adapted WHO algorithm. - Clinician judgment and/or patient preferences may indicate treatment for people with 10-year fracture probabilities above or below these levels 3. Patients with diagnosis of osteoporosis or at high risk for fracture should have regular bone mineral density tests. For patients eligible for Medicare, routine testing is allowed once every 2 years. The testing frequency can be increased to one year for patients who have rapidly progressing disease, those who are receiving or discontinuing medical therapy to restore bone mass, or have additional risk factors. Electronically Signed   By: Harrietta Sherry M.D.   On: 09/17/2023 13:32

## 2023-10-20 ENCOUNTER — Other Ambulatory Visit: Payer: Self-pay

## 2023-10-20 ENCOUNTER — Emergency Department (HOSPITAL_COMMUNITY)

## 2023-10-20 ENCOUNTER — Observation Stay (HOSPITAL_COMMUNITY)
Admission: EM | Admit: 2023-10-20 | Discharge: 2023-10-21 | Disposition: A | Discharge status: Home / Self Care | Attending: Surgery | Admitting: Surgery

## 2023-10-20 DIAGNOSIS — I509 Heart failure, unspecified: Secondary | ICD-10-CM | POA: Insufficient documentation

## 2023-10-20 DIAGNOSIS — Z91040 Latex allergy status: Secondary | ICD-10-CM | POA: Insufficient documentation

## 2023-10-20 DIAGNOSIS — Z803 Family history of malignant neoplasm of breast: Secondary | ICD-10-CM | POA: Insufficient documentation

## 2023-10-20 DIAGNOSIS — R4182 Altered mental status, unspecified: Secondary | ICD-10-CM | POA: Diagnosis present

## 2023-10-20 DIAGNOSIS — Z79899 Other long term (current) drug therapy: Secondary | ICD-10-CM | POA: Diagnosis not present

## 2023-10-20 DIAGNOSIS — I110 Hypertensive heart disease with heart failure: Secondary | ICD-10-CM | POA: Insufficient documentation

## 2023-10-20 DIAGNOSIS — I2510 Atherosclerotic heart disease of native coronary artery without angina pectoris: Secondary | ICD-10-CM | POA: Insufficient documentation

## 2023-10-20 DIAGNOSIS — S065XAA Traumatic subdural hemorrhage with loss of consciousness status unknown, initial encounter: Principal | ICD-10-CM | POA: Insufficient documentation

## 2023-10-20 DIAGNOSIS — K5730 Diverticulosis of large intestine without perforation or abscess without bleeding: Secondary | ICD-10-CM | POA: Diagnosis not present

## 2023-10-20 DIAGNOSIS — S0990XA Unspecified injury of head, initial encounter: Secondary | ICD-10-CM | POA: Diagnosis present

## 2023-10-20 DIAGNOSIS — Z87891 Personal history of nicotine dependence: Secondary | ICD-10-CM | POA: Diagnosis not present

## 2023-10-20 DIAGNOSIS — Z79811 Long term (current) use of aromatase inhibitors: Secondary | ICD-10-CM | POA: Insufficient documentation

## 2023-10-20 DIAGNOSIS — I700 Atherosclerosis of aorta: Secondary | ICD-10-CM | POA: Insufficient documentation

## 2023-10-20 DIAGNOSIS — S069XAA Unspecified intracranial injury with loss of consciousness status unknown, initial encounter: Secondary | ICD-10-CM | POA: Diagnosis present

## 2023-10-20 DIAGNOSIS — Z86711 Personal history of pulmonary embolism: Secondary | ICD-10-CM | POA: Insufficient documentation

## 2023-10-20 DIAGNOSIS — C50919 Malignant neoplasm of unspecified site of unspecified female breast: Secondary | ICD-10-CM | POA: Diagnosis not present

## 2023-10-20 DIAGNOSIS — M810 Age-related osteoporosis without current pathological fracture: Secondary | ICD-10-CM | POA: Diagnosis not present

## 2023-10-20 DIAGNOSIS — W1830XA Fall on same level, unspecified, initial encounter: Secondary | ICD-10-CM | POA: Diagnosis not present

## 2023-10-20 DIAGNOSIS — Z515 Encounter for palliative care: Secondary | ICD-10-CM | POA: Insufficient documentation

## 2023-10-20 LAB — CBC WITH DIFFERENTIAL/PLATELET
Abs Immature Granulocytes: 0.03 K/uL (ref 0.00–0.07)
Basophils Absolute: 0 K/uL (ref 0.0–0.1)
Basophils Relative: 1 %
Eosinophils Absolute: 0 K/uL (ref 0.0–0.5)
Eosinophils Relative: 0 %
HCT: 36.5 % (ref 36.0–46.0)
Hemoglobin: 12.3 g/dL (ref 12.0–15.0)
Immature Granulocytes: 1 %
Lymphocytes Relative: 30 %
Lymphs Abs: 1.7 K/uL (ref 0.7–4.0)
MCH: 32.7 pg (ref 26.0–34.0)
MCHC: 33.7 g/dL (ref 30.0–36.0)
MCV: 97.1 fL (ref 80.0–100.0)
Monocytes Absolute: 0.6 K/uL (ref 0.1–1.0)
Monocytes Relative: 11 %
Neutro Abs: 3.2 K/uL (ref 1.7–7.7)
Neutrophils Relative %: 57 %
Platelets: 223 K/uL (ref 150–400)
RBC: 3.76 MIL/uL — ABNORMAL LOW (ref 3.87–5.11)
RDW: 13 % (ref 11.5–15.5)
WBC: 5.6 K/uL (ref 4.0–10.5)
nRBC: 0 % (ref 0.0–0.2)

## 2023-10-20 LAB — COMPREHENSIVE METABOLIC PANEL WITH GFR
ALT: 18 U/L (ref 0–44)
AST: 25 U/L (ref 15–41)
Albumin: 3.4 g/dL — ABNORMAL LOW (ref 3.5–5.0)
Alkaline Phosphatase: 48 U/L (ref 38–126)
Anion gap: 11 (ref 5–15)
BUN: 16 mg/dL (ref 8–23)
CO2: 24 mmol/L (ref 22–32)
Calcium: 8.6 mg/dL — ABNORMAL LOW (ref 8.9–10.3)
Chloride: 103 mmol/L (ref 98–111)
Creatinine, Ser: 1.29 mg/dL — ABNORMAL HIGH (ref 0.44–1.00)
GFR, Estimated: 39 mL/min — ABNORMAL LOW (ref >60)
Glucose, Bld: 114 mg/dL — ABNORMAL HIGH (ref 70–99)
Potassium: 3.8 mmol/L (ref 3.5–5.1)
Sodium: 138 mmol/L (ref 135–145)
Total Bilirubin: 0.6 mg/dL (ref 0.0–1.2)
Total Protein: 6.1 g/dL — ABNORMAL LOW (ref 6.5–8.1)

## 2023-10-20 LAB — PROTIME-INR
INR: 1.9 — ABNORMAL HIGH (ref 0.8–1.2)
Prothrombin Time: 22.7 s — ABNORMAL HIGH (ref 11.4–15.2)

## 2023-10-20 MED ORDER — VITAMIN K1 10 MG/ML IJ SOLN
10.0000 mg | INTRAVENOUS | Status: AC
  Administered 2023-10-21: 10 mg via INTRAVENOUS
  Filled 2023-10-20: qty 1

## 2023-10-20 MED ORDER — ONDANSETRON HCL 4 MG/2ML IJ SOLN
4.0000 mg | Freq: Once | INTRAMUSCULAR | Status: AC
  Administered 2023-10-20: 4 mg via INTRAVENOUS
  Filled 2023-10-20: qty 2

## 2023-10-20 MED ORDER — IOHEXOL 350 MG/ML SOLN
75.0000 mL | Freq: Once | INTRAVENOUS | Status: AC | PRN
  Administered 2023-10-20: 75 mL via INTRAVENOUS

## 2023-10-20 MED ORDER — PROTHROMBIN COMPLEX CONC HUMAN 500 UNITS IV KIT
2134.0000 [IU] | PACK | INTRAVENOUS | Status: AC
  Administered 2023-10-21: 2134 [IU] via INTRAVENOUS
  Filled 2023-10-20: qty 2134

## 2023-10-20 MED ORDER — FENTANYL CITRATE (PF) 50 MCG/ML IJ SOSY
50.0000 ug | PREFILLED_SYRINGE | INTRAMUSCULAR | Status: DC | PRN
  Administered 2023-10-21: 50 ug via INTRAVENOUS
  Filled 2023-10-20: qty 1

## 2023-10-20 NOTE — ED Triage Notes (Addendum)
 Pt BIB EMS from home due to mechanical fall, no LOC. Pt on Warfarin. GCS 15. Pt w/ hematoma to the rt eye/forehead. Pt reports 10/10 rt hip and elbow pain. C-collar in place.

## 2023-10-20 NOTE — Progress Notes (Signed)
   10/20/23 2315  Spiritual Encounters  Type of Visit Initial  Care provided to: Pt not available  Referral source Trauma page  Reason for visit Trauma  OnCall Visit No   Chaplain responded to a level two trauma. No family present.  If a chaplain is requested someone will respond.   Carley [facility]  [phone]

## 2023-10-20 NOTE — ED Provider Notes (Signed)
  [place] EMERGENCY DEPARTMENT AT [facility] Provider Note   CSN: [id number] Arrival date & time: 10/20/23  2306     History No chief complaint on file.   HPI Melis Trochez is a 89 y.o. female presenting for chief complaint of fall.   Patient's recorded medical, surgical, social, medication list and allergies were reviewed in the Snapshot window as part of the initial history.   Review of Systems   Review of Systems  Physical Exam Updated Vital Signs There were no vitals taken for this visit. Physical Exam   ED Course/ Medical Decision Making/ A&P    Procedures Procedures   Medications Ordered in ED Medications - No data to display  Medical Decision Making:   Khanh Cordner is a 89 y.o. female who presented to the ED today with *** detailed above.    {crccomplexity:27900} Complete initial physical exam performed, notably the patient  was ***.    Reviewed and confirmed nursing documentation for past medical history, family history, social history.    Initial Assessment:   With the patient's presentation of ***, most likely diagnosis is ***. Other diagnoses were considered including (but not limited to) ***. These are considered less likely due to history of present illness and physical exam findings.   {crccopa:27899}  Initial Plan:  ***  ***Screening labs including CBC and Metabolic panel to evaluate for infectious or metabolic etiology of disease.  ***Urinalysis with reflex culture ordered to evaluate for UTI or relevant urologic/nephrologic pathology.  ***CXR to evaluate for structural/infectious intrathoracic pathology.  {crccardiactesting:32591::EKG to evaluate for cardiac pathology} Objective evaluation as below reviewed   Initial Study Results:   Laboratory  All laboratory results reviewed without evidence of clinically relevant pathology.   ***Exceptions include: ***   ***EKG EKG was reviewed independently. Rate, rhythm,  axis, intervals all examined and without medically relevant abnormality. ST segments without concerns for elevations.    Radiology:  All images reviewed independently. ***Agree with radiology report at this time.   No results found.    Consults: Case discussed with ***.   Reassessment and Plan:   ***    ***  Clinical Impression: No diagnosis found.   Data Unavailable   Final Clinical Impression(s) / ED Diagnoses Final diagnoses:  None    Rx / DC Orders ED Discharge Orders     None

## 2023-10-20 NOTE — ED Notes (Signed)
 Pt arrived to CT

## 2023-10-21 ENCOUNTER — Emergency Department (HOSPITAL_COMMUNITY)

## 2023-10-21 ENCOUNTER — Observation Stay (HOSPITAL_COMMUNITY)

## 2023-10-21 DIAGNOSIS — Z515 Encounter for palliative care: Secondary | ICD-10-CM | POA: Diagnosis not present

## 2023-10-21 DIAGNOSIS — F419 Anxiety disorder, unspecified: Secondary | ICD-10-CM | POA: Diagnosis not present

## 2023-10-21 DIAGNOSIS — S069XAA Unspecified intracranial injury with loss of consciousness status unknown, initial encounter: Secondary | ICD-10-CM | POA: Diagnosis present

## 2023-10-21 DIAGNOSIS — S069XAD Unspecified intracranial injury with loss of consciousness status unknown, subsequent encounter: Secondary | ICD-10-CM | POA: Diagnosis not present

## 2023-10-21 DIAGNOSIS — Z7189 Other specified counseling: Secondary | ICD-10-CM | POA: Diagnosis not present

## 2023-10-21 LAB — BASIC METABOLIC PANEL WITH GFR
Anion gap: 13 (ref 5–15)
BUN: 15 mg/dL (ref 8–23)
CO2: 26 mmol/L (ref 22–32)
Calcium: 8.6 mg/dL — ABNORMAL LOW (ref 8.9–10.3)
Chloride: 100 mmol/L (ref 98–111)
Creatinine, Ser: 1.19 mg/dL — ABNORMAL HIGH (ref 0.44–1.00)
GFR, Estimated: 43 mL/min — ABNORMAL LOW (ref >60)
Glucose, Bld: 141 mg/dL — ABNORMAL HIGH (ref 70–99)
Potassium: 3.8 mmol/L (ref 3.5–5.1)
Sodium: 139 mmol/L (ref 135–145)

## 2023-10-21 LAB — LIPASE, BLOOD: Lipase: 33 U/L (ref 11–51)

## 2023-10-21 LAB — CBC
HCT: 37 % (ref 36.0–46.0)
Hemoglobin: 12.5 g/dL (ref 12.0–15.0)
MCH: 32.6 pg (ref 26.0–34.0)
MCHC: 33.8 g/dL (ref 30.0–36.0)
MCV: 96.6 fL (ref 80.0–100.0)
Platelets: 204 K/uL (ref 150–400)
RBC: 3.83 MIL/uL — ABNORMAL LOW (ref 3.87–5.11)
RDW: 12.8 % (ref 11.5–15.5)
WBC: 9.3 K/uL (ref 4.0–10.5)
nRBC: 0 % (ref 0.0–0.2)

## 2023-10-21 LAB — PROTIME-INR
INR: 1.1 (ref 0.8–1.2)
Prothrombin Time: 14.4 s (ref 11.4–15.2)

## 2023-10-21 LAB — MRSA NEXT GEN BY PCR, NASAL: MRSA by PCR Next Gen: NOT DETECTED

## 2023-10-21 MED ORDER — FENTANYL CITRATE (PF) 50 MCG/ML IJ SOSY
25.0000 ug | PREFILLED_SYRINGE | INTRAMUSCULAR | Status: DC | PRN
  Administered 2023-10-21: 25 ug via INTRAVENOUS
  Filled 2023-10-21: qty 1

## 2023-10-21 MED ORDER — GLYCOPYRROLATE 0.2 MG/ML IJ SOLN
0.2000 mg | INTRAMUSCULAR | Status: DC | PRN
  Administered 2023-10-21 (×2): 0.2 mg via INTRAVENOUS
  Filled 2023-10-21 (×2): qty 1

## 2023-10-21 MED ORDER — ONDANSETRON HCL 4 MG/2ML IJ SOLN: 4.0000 mg | Freq: Four times a day (QID) | INTRAMUSCULAR | Status: DC | PRN

## 2023-10-21 MED ORDER — NICARDIPINE HCL IN NACL 20-0.86 MG/200ML-% IV SOLN
3.0000 mg/h | INTRAVENOUS | Status: DC
  Administered 2023-10-21: 5 mg/h via INTRAVENOUS
  Filled 2023-10-21: qty 200

## 2023-10-21 MED ORDER — DIAZEPAM 5 MG/ML IJ SOLN: INTRAMUSCULAR | Status: DC

## 2023-10-21 MED ORDER — PANTOPRAZOLE SODIUM 40 MG IV SOLR: 40.0000 mg | Freq: Every day | INTRAVENOUS | Status: DC

## 2023-10-21 MED ORDER — FENTANYL CITRATE (PF) 50 MCG/ML IJ SOSY: 50.0000 ug | PREFILLED_SYRINGE | INTRAMUSCULAR | Status: DC | PRN

## 2023-10-21 MED ORDER — HYDRALAZINE HCL 20 MG/ML IJ SOLN: 10.0000 mg | INTRAMUSCULAR | Status: DC | PRN

## 2023-10-21 MED ORDER — METHOCARBAMOL 1000 MG/10ML IJ SOLN: 500.0000 mg | Freq: Three times a day (TID) | INTRAMUSCULAR | Status: DC

## 2023-10-21 MED ORDER — GLYCOPYRROLATE 1 MG PO TABS: 1.0000 mg | ORAL_TABLET | ORAL | Status: DC | PRN

## 2023-10-21 MED ORDER — ORAL CARE MOUTH RINSE
15.0000 mL | OROMUCOSAL | Status: DC
  Administered 2023-10-21 (×3): 15 mL via OROMUCOSAL

## 2023-10-21 MED ORDER — ACETAMINOPHEN 650 MG RE SUPP: 650.0000 mg | Freq: Four times a day (QID) | RECTAL | Status: DC | PRN

## 2023-10-21 MED ORDER — DIAZEPAM 5 MG/ML IJ SOLN
5.0000 mg | Freq: Three times a day (TID) | INTRAMUSCULAR | Status: DC
  Administered 2023-10-21: 5 mg via INTRAVENOUS
  Filled 2023-10-21: qty 2

## 2023-10-21 MED ORDER — MORPHINE SULFATE (PF) 2 MG/ML IV SOLN
2.0000 mg | INTRAVENOUS | Status: DC | PRN
  Administered 2023-10-21 (×2): 2 mg via INTRAVENOUS
  Administered 2023-10-21 (×2): 4 mg via INTRAVENOUS
  Administered 2023-10-21: 2 mg via INTRAVENOUS
  Filled 2023-10-21 (×2): qty 1
  Filled 2023-10-21: qty 2
  Filled 2023-10-21: qty 1
  Filled 2023-10-21: qty 2

## 2023-10-21 MED ORDER — GLYCOPYRROLATE 0.2 MG/ML IJ SOLN: 0.2000 mg | INTRAMUSCULAR | Status: DC | PRN

## 2023-10-21 MED ORDER — ACETAMINOPHEN 500 MG PO TABS: 1000.0000 mg | ORAL_TABLET | Freq: Four times a day (QID) | ORAL | Status: DC

## 2023-10-21 MED ORDER — FENTANYL CITRATE (PF) 50 MCG/ML IJ SOSY
50.0000 ug | PREFILLED_SYRINGE | INTRAMUSCULAR | Status: AC | PRN
  Administered 2023-10-21: 50 ug via INTRAVENOUS
  Filled 2023-10-21: qty 1

## 2023-10-21 MED ORDER — LEVETIRACETAM (KEPPRA) 500 MG/5 ML ADULT IV PUSH
500.0000 mg | Freq: Two times a day (BID) | INTRAVENOUS | Status: DC
  Administered 2023-10-21: 500 mg via INTRAVENOUS
  Filled 2023-10-21: qty 5

## 2023-10-21 MED ORDER — POLYVINYL ALCOHOL 1.4 % OP SOLN: 1.0000 [drp] | Freq: Four times a day (QID) | OPHTHALMIC | Status: DC | PRN

## 2023-10-21 MED ORDER — ONDANSETRON 4 MG PO TBDP: 4.0000 mg | ORAL_TABLET | Freq: Four times a day (QID) | ORAL | Status: DC | PRN

## 2023-10-21 MED ORDER — ONDANSETRON HCL 4 MG/2ML IJ SOLN
4.0000 mg | Freq: Once | INTRAMUSCULAR | Status: AC
  Administered 2023-10-21: 4 mg via INTRAVENOUS
  Filled 2023-10-21: qty 2

## 2023-10-21 MED ORDER — MORPHINE SULFATE (PF) 2 MG/ML IV SOLN: 2.0000 mg | INTRAVENOUS | Status: DC | PRN

## 2023-10-21 MED ORDER — ACETAMINOPHEN 325 MG PO TABS: 650.0000 mg | ORAL_TABLET | Freq: Four times a day (QID) | ORAL | Status: DC | PRN

## 2023-10-21 MED ORDER — DIAZEPAM 5 MG/ML IJ SOLN
5.0000 mg | INTRAMUSCULAR | Status: DC | PRN
  Administered 2023-10-21 (×2): 5 mg via INTRAVENOUS
  Filled 2023-10-21 (×2): qty 2

## 2023-10-21 MED ORDER — PANTOPRAZOLE SODIUM 40 MG PO TBEC: 40.0000 mg | DELAYED_RELEASE_TABLET | Freq: Every day | ORAL | Status: DC

## 2023-10-21 MED ORDER — METHOCARBAMOL 1000 MG/10ML IJ SOLN
500.0000 mg | Freq: Three times a day (TID) | INTRAMUSCULAR | Status: DC
  Administered 2023-10-21 (×3): 500 mg via INTRAVENOUS
  Filled 2023-10-21 (×3): qty 10

## 2023-10-21 MED ORDER — METHOCARBAMOL 500 MG PO TABS: 500.0000 mg | ORAL_TABLET | Freq: Three times a day (TID) | ORAL | Status: DC

## 2023-10-21 MED ORDER — CHLORHEXIDINE GLUCONATE CLOTH 2 % EX PADS: 6.0000 | MEDICATED_PAD | Freq: Every day | CUTANEOUS | Status: DC

## 2023-10-21 MED ORDER — NICARDIPINE HCL IN NACL 20-0.86 MG/200ML-% IV SOLN: 3.0000 mg/h | INTRAVENOUS | Status: DC

## 2023-10-21 MED ORDER — ORAL CARE MOUTH RINSE: 15.0000 mL | OROMUCOSAL | Status: DC | PRN

## 2023-10-21 NOTE — Consult Note (Signed)
 Reason for Consult: Subdural hematoma Referring Physician: Dr. Bari Arrant Cynthia Raymond is an 89 y.o. female.  HPI: The patient is a 89 year old individual with multiple medical problems on Coumadin anticoagulation who sustained a ground-level fall.  CT scan of the brain demonstrated presence of a right sided subdural hematoma with modest mass effect and slight shift.  Patient was neurologically normal she was given Kcentra in the emergency department however after several hours there was some neurologic deterioration and the patient had a repeat CT which now shows massive shift and enlarging subdural hematoma.  Given the patient's multiple medical problems and advanced age I advised against surgical intervention for this process as it is likely not to yield a healthy outcome.  I came by the patient's room this morning noting that the patient is deeply lethargic but does moan spontaneously.  I spoke with the patient's daughter to explain the situation and she is concurring with the thought that no surgical intervention needs to be undertaken.  Past Medical History:  Diagnosis Date   Cancer (HCC)    breast   Graves disease    Hypertension    Osteoporosis    Thyroid  disease     Past Surgical History:  Procedure Laterality Date   BACK SURGERY     X3   BREAST BIOPSY Left 08/05/2023   US  LT BREAST BX W LOC DEV 1ST LESION IMG BX SPEC US  GUIDE 08/05/2023 [facility]   BREAST LUMPECTOMY Left    @ 2000   CATARACT EXTRACTION W/PHACO Left 02/18/2018   Procedure: CATARACT EXTRACTION PHACO AND INTRAOCULAR LENS PLACEMENT (IOC) LEFT;  Surgeon: Mittie Gaskin, MD;  Location: [facility];  Service: Ophthalmology;  Laterality: Left;   CATARACT EXTRACTION W/PHACO Right 03/17/2018   Procedure: CATARACT EXTRACTION PHACO AND INTRAOCULAR LENS PLACEMENT (IOC)  RIGHT;  Surgeon: Mittie Gaskin, MD;  Location: [facility];  Service: Ophthalmology;  Laterality: Right;   ELBOW  SURGERY Right    HAND SURGERY Right    JOINT REPLACEMENT Bilateral    Both knees, left shoulder   ROTATOR CUFF REPAIR Bilateral     Family History  Problem Relation Age of Onset   Breast cancer Mother 74 - 69   Cancer Mother    Heart attack Father    Breast cancer Sister 19 - 23   Cancer Sister    Heart disease Sister    Hyperlipidemia Sister    Heart attack Sister    Breast cancer Sister 47 - 12   Breast cancer Sister 37 - 60   Cancer Daughter    Cancer Brother     Social History:  reports that she quit smoking about 36 years ago. Her smoking use included cigarettes. She has never used smokeless tobacco. She reports that she does not drink alcohol and does not use drugs.  Allergies:  Allergies  Allergen Reactions   Penicillins Anaphylaxis and Shortness Of Breath   Gabapentin Other (See Comments) and Nausea And Vomiting   Morphine And Codeine Other (See Comments)    AMS   Sulfa Antibiotics Other (See Comments)    Tongue and lip swelling   Amitriptyline Other (See Comments)    Yeast infection   Buspirone Other (See Comments)    Increased anxiety, agitation   Latex Rash    Pt states it is ok to use briefly   Morphine Other (See Comments)    Headache    Medications: I have reviewed the patient's current medications.  Results for orders placed  or performed during the hospital encounter of 10/20/23 (from the past 48 hours)  CBC with Differential     Status: Abnormal   Collection Time: 10/20/23 11:12 PM  Result Value Ref Range   WBC 5.6 4.0 - 10.5 K/uL   RBC 3.76 (L) 3.87 - 5.11 MIL/uL   Hemoglobin 12.3 12.0 - 15.0 g/dL   HCT 63.4 63.9 - 53.9 %   MCV 97.1 80.0 - 100.0 fL   MCH 32.7 26.0 - 34.0 pg   MCHC 33.7 30.0 - 36.0 g/dL   RDW 86.9 88.4 - 84.4 %   Platelets 223 150 - 400 K/uL   nRBC 0.0 0.0 - 0.2 %   Neutrophils Relative % 57 %   Neutro Abs 3.2 1.7 - 7.7 K/uL   Lymphocytes Relative 30 %   Lymphs Abs 1.7 0.7 - 4.0 K/uL   Monocytes Relative 11 %    Monocytes Absolute 0.6 0.1 - 1.0 K/uL   Eosinophils Relative 0 %   Eosinophils Absolute 0.0 0.0 - 0.5 K/uL   Basophils Relative 1 %   Basophils Absolute 0.0 0.0 - 0.1 K/uL   Immature Granulocytes 1 %   Abs Immature Granulocytes 0.03 0.00 - 0.07 K/uL    Comment: Performed at [facility], [address] [place], [place] [address]  Comprehensive metabolic panel     Status: Abnormal   Collection Time: 10/20/23 11:12 PM  Result Value Ref Range   Sodium 138 135 - 145 mmol/L   Potassium 3.8 3.5 - 5.1 mmol/L   Chloride 103 98 - 111 mmol/L   CO2 24 22 - 32 mmol/L   Glucose, Bld 114 (H) 70 - 99 mg/dL    Comment: Glucose reference range applies only to samples taken after fasting for at least 8 hours.   BUN 16 8 - 23 mg/dL   Creatinine, Ser 8.70 (H) 0.44 - 1.00 mg/dL   Calcium  8.6 (L) 8.9 - 10.3 mg/dL   Total Protein 6.1 (L) 6.5 - 8.1 g/dL   Albumin 3.4 (L) 3.5 - 5.0 g/dL   AST 25 15 - 41 U/L   ALT 18 0 - 44 U/L   Alkaline Phosphatase 48 38 - 126 U/L   Total Bilirubin 0.6 0.0 - 1.2 mg/dL   GFR, Estimated 39 (L) >60 mL/min    Comment: (NOTE) Calculated using the CKD-EPI Creatinine Equation (2021)    Anion gap 11 5 - 15    Comment: Performed at [facility], [address], [place], [place] [address]  Lipase, blood     Status: None   Collection Time: 10/20/23 11:12 PM  Result Value Ref Range   Lipase 33 11 - 51 U/L    Comment: Performed at [facility], [address], [place], [place] [address]  Protime-INR     Status: Abnormal   Collection Time: 10/20/23 11:12 PM  Result Value Ref Range   Prothrombin Time 22.7 (H) 11.4 - 15.2 seconds   INR 1.9 (H) 0.8 - 1.2    Comment: (NOTE) INR goal varies based on device and disease states. Performed at [facility], [address] [place], [place] [address]   Protime-INR     Status: None   Collection Time: 10/21/23  1:59 AM  Result Value Ref Range   Prothrombin Time 14.4 11.4 - 15.2 seconds   INR 1.1 0.8 - 1.2     Comment: (NOTE) INR goal varies based on device and disease states. Performed  at [facility], [address] [place], [place] [address]   MRSA Next Gen by PCR, Nasal     Status: None   Collection Time: 10/21/23  3:31 AM   Specimen: Nasal Mucosa; Nasal Swab  Result Value Ref Range   MRSA by PCR Next Gen NOT DETECTED NOT DETECTED    Comment: (NOTE) The GeneXpert MRSA Assay (FDA approved for NASAL specimens only), is one component of a comprehensive MRSA colonization surveillance program. It is not intended to diagnose MRSA infection nor to guide or monitor treatment for MRSA infections. Test performance is not FDA approved in patients less than 77 years old. Performed at [facility], [address] [place], [place] [address]   CBC     Status: Abnormal   Collection Time: 10/21/23  5:21 AM  Result Value Ref Range   WBC 9.3 4.0 - 10.5 K/uL   RBC 3.83 (L) 3.87 - 5.11 MIL/uL   Hemoglobin 12.5 12.0 - 15.0 g/dL   HCT 62.9 63.9 - 53.9 %   MCV 96.6 80.0 - 100.0 fL   MCH 32.6 26.0 - 34.0 pg   MCHC 33.8 30.0 - 36.0 g/dL   RDW 87.1 88.4 - 84.4 %   Platelets 204 150 - 400 K/uL   nRBC 0.0 0.0 - 0.2 %    Comment: Performed at [facility], [address], [place], [place] [address]  Basic metabolic panel     Status: Abnormal   Collection Time: 10/21/23  5:21 AM  Result Value Ref Range   Sodium 139 135 - 145 mmol/L   Potassium 3.8 3.5 - 5.1 mmol/L   Chloride 100 98 - 111 mmol/L   CO2 26 22 - 32 mmol/L   Glucose, Bld 141 (H) 70 - 99 mg/dL    Comment: Glucose reference range applies only to samples taken after fasting for at least 8 hours.   BUN 15 8 - 23 mg/dL   Creatinine, Ser 8.80 (H) 0.44 - 1.00 mg/dL   Calcium  8.6 (L) 8.9 - 10.3 mg/dL   GFR, Estimated 43 (L) >60 mL/min    Comment: (NOTE) Calculated using the CKD-EPI Creatinine Equation (2021)    Anion gap 13 5 - 15    Comment: Performed at [facility], [address] [place], [place] [address]     CT HEAD WO CONTRAST ( ) Result Date: 10/21/2023 CLINICAL DATA:  Head trauma, minor (Age >= 65y).  Fall. EXAM: CT HEAD WITHOUT CONTRAST TECHNIQUE: Contiguous axial images were obtained from the base of the skull through the vertex without intravenous contrast. RADIATION DOSE REDUCTION: This exam was performed according to the departmental dose-optimization program which includes automated exposure control, adjustment of the mA and/or kV according to patient size and/or use of iterative reconstruction technique. COMPARISON:  10/20/2023 FINDINGS: Brain: Marked interval worsening of the right subdural hematoma now overlying much of the entire right cerebral hemisphere now measuring up to 2 cm in thickness. Blood also noted along the posterior falx measuring 4 mm in thickness. Increasing right to left midline shift now measuring 1.5 cm compared to 7 mm previously. No hydrocephalus. Vascular: No hyperdense vessel or unexpected calcification. Skull: No acute calvarial abnormality. Sinuses/Orbits: No acute findings Other: None IMPRESSION: Significant interval worsening in the right subdural hematoma now overlying much of the right cerebral hemisphere and noted along the falx. Increasing right to left midline shift now 1.5 cm. These results were called by telephone at the time of  interpretation on 10/21/2023 at 1:36 am to provider Dr. Ann, who verbally acknowledged these results. Electronically Signed   By: Franky Crease M.D.   On: 10/21/2023 01:38   CT HEAD WO CONTRAST ( ) Result Date: 10/20/2023 EXAM: CT HEAD WITHOUT CONTRAST 10/20/2023 11:48:41 PM TECHNIQUE: CT of the head was performed without the administration of intravenous contrast. Automated exposure control, iterative reconstruction, and/or weight based adjustment of the mA/kV was utilized to reduce the radiation dose to as low as reasonably achievable. COMPARISON: None available. CLINICAL HISTORY: Head trauma, minor (Age >= 65y). Fall,  polytrauma, blunt. FINDINGS: BRAIN AND VENTRICLES: Acute 20mm on chronic 12mm extraxial hematoma - likely subdural - along the right frontotemporal calvarial convexity. Associated 8 mm right to left midline shift. 3mm right parafalcine acute subdural hematoma. Effacement of some of the cerebral sulci along the frontal and temporal lobes. Possible associated trace right subarachnoid hemorrhage. No evidence of acute infarct. No hydrocephalus. ORBITS: No acute abnormality. SINUSES: No acute abnormality. SOFT TISSUES AND SKULL: No acute soft tissue abnormality. No skull fracture. IMPRESSION: 1. Acute on chronic 20 mm right frontotemporal extra-axial hematoma with 8 mm right-to-left midline shift. Hematoma is likely subdural in etiology; although, possible superimposed acute epidural component is difficult to exclude and a short-term interval follow-up to ensure it is not expanding is suggested. 2. Associated 3mm right parafalcine acute subdural hematoma. 3. Possible trace right subarachnoid hemorrhage. Findings discussed over the phone with doctor Countryman by doctor Naveau on 10/20/2023 at 11:49 pm. Electronically signed by: Morgane Naveau MD 10/20/2023 11:59 PM EDT RP Workstation: [id number]   CT CHEST ABDOMEN PELVIS W CONTRAST Result Date: 10/20/2023 EXAM: CT CHEST, ABDOMEN AND PELVIS WITH CONTRAST 10/20/2023 11:48:41 PM TECHNIQUE: CT of the chest, abdomen and pelvis was performed with the administration of 75 mL of iohexol  (OMNIPAQUE ) 350 MG/ML injection. Multiplanar reformatted images are provided for review. Automated exposure control, iterative reconstruction, and/or weight based adjustment of the mA/kV was utilized to reduce the radiation dose to as low as reasonably achievable. COMPARISON: None available. CLINICAL HISTORY: Polytrauma, blunt. FINDINGS: CHEST: MEDIASTINUM AND LYMPH NODES: The heart is enlarged. There are atherosclerotic calcifications of the aorta and coronary arteries. The central airways  are clear. No mediastinal hematoma or pneumomediastinum. No mediastinal, hilar or axillary lymphadenopathy. LUNGS AND PLEURA: No focal consolidation or pulmonary edema. No pleural effusion or pneumothorax. ABDOMEN AND PELVIS: LIVER: The liver is unremarkable. GALLBLADDER AND BILE DUCTS: The gallbladder is surgically absent. No biliary ductal dilatation. SPLEEN: Rounded subcentimeter hypodensities in the spleen appear unchanged. Spleen is normal in size. PANCREAS: No acute abnormality. ADRENAL GLANDS: No acute abnormality. KIDNEYS, URETERS AND BLADDER: No stones in the kidneys or ureters. No hydronephrosis. No perinephric or periureteral stranding. Urinary bladder is unremarkable. GI AND BOWEL: Stomach demonstrates no acute abnormality. There is no bowel obstruction. There is sigmoid colon diverticulosis. The appendix is normal. REPRODUCTIVE ORGANS: No acute abnormality. PERITONEUM AND RETROPERITONEUM: No ascites. No free air. VASCULATURE: Aorta is normal in caliber. There are atherosclerotic calcifications of the aorta. ABDOMINAL AND PELVIS LYMPH NODES: No lymphadenopathy. BONES AND SOFT TISSUES: The bones are osteopenic. No acute fractures are seen. Degenerative changes are seen throughout the spine. Lumbar fusion hardware is noted at L3-L4. There are postsurgical changes in both shoulders and the right hip. No focal soft tissue abnormality. IMPRESSION: 1. No acute traumatic injury in the chest, abdomen, or pelvis. 2. Cardiomegaly. 3. Aortic and coronary artery atherosclerosis. 4. Colonic diverticulosis. Electronically signed by: Greig Pique MD 10/20/2023 11:58 PM  EDT RP Workstation: [id number]   CT CERVICAL SPINE WO CONTRAST Result Date: 10/20/2023 EXAM: CT CERVICAL SPINE WITH CONTRAST 10/20/2023 11:48:41 PM TECHNIQUE: CT of the cervical spine was performed with the administration of intravenous contrast (iohexol  (OMNIPAQUE ) 350 MG/ML injection 75 mL). Multiplanar reformatted images are provided for review.  Automated exposure control, iterative reconstruction, and/or weight based adjustment of the mA/kV was utilized to reduce the radiation dose to as low as reasonably achievable. COMPARISON: None available. CLINICAL HISTORY: Polytrauma, blunt. Fall, polytrauma, blunt. FINDINGS: CERVICAL SPINE: BONES AND ALIGNMENT: No acute fracture or traumatic malalignment. DEGENERATIVE CHANGES: Mild degenerative changes with bulky anterior osteophytosis of the mid cervical spine. SOFT TISSUES: No prevertebral soft tissue swelling. IMPRESSION: 1. No acute abnormality of the cervical spine. Electronically signed by: Pinkie Pebbles MD 10/20/2023 11:52 PM EDT RP Workstation: [id number]   DG Pelvis Portable Result Date: 10/20/2023 EXAM: 1 or 2 VIEW(S) XRAY OF THE PELVIS 10/20/2023 11:35:00 PM COMPARISON: CT abdomen and pelvis 01/27/23. CLINICAL HISTORY: fall. Table formatting from the original note was not included.; Images from the original note were not included.; Pt BIB EMS from home due to mechanical fall, no LOC. Pt on Warfarin. GCS 15. Pt w/ hematoma to the rt eye/forehead. Pt reports 10/10 rt hip and elbow pain. C-collar in place. FINDINGS: BONES AND JOINTS: Partially visualized total right hip arthroplasty. No acute displaced fracture or dislocation of the hips. No acute displaced fracture or diastasis of the bones of the pelvis. The sacrum is grossly unremarkable - limited evaluation due to overlying osseous structures and soft tissues. Visualized lower lumbar spine demonstrates degenerative changes as well as L3-L4 surgical hardware. SOFT TISSUES: The soft tissues are unremarkable. IMPRESSION: 1. No acute traumatic injury. Limited evaluation due to overlying osseous structures and soft tissues. Electronically signed by: Morgane Naveau MD 10/20/2023 11:44 PM EDT RP Workstation: [id number]   DG Chest Portable 1 View Result Date: 10/20/2023 EXAM: 1 VIEW(S) XRAY OF THE CHEST 10/20/2023 11:35:00 PM COMPARISON: Chest x-ray  01/27/2023. CT chest 01/27/23 CLINICAL HISTORY: Fall. Pt on Warfarin. GCS 15. Pt w/ hematoma to the rt eye/forehead. Pt reports 10/10 rt hip and elbow pain. FINDINGS: LUNGS AND PLEURA: Chronic coarsened interstitial markings with no overt pulmonary edema. No focal pulmonary opacity. No pleural effusion. No pneumothorax. HEART AND MEDIASTINUM: Atherosclerotic plaque of the aorta. Tortuous aorta. Coronary artery calcification. BONES AND SOFT TISSUES: Anchor sutures overlie the left shoulder. Reverse total right shoulder arthroplasty partially visualized. IMPRESSION: 1. No acute cardiopulmonary abnormality related to the fall. Electronically signed by: Morgane Naveau MD 10/20/2023 11:40 PM EDT RP Workstation: [id number]    Review of Systems  Unable to perform ROS: Acuity of condition   Blood pressure (!) 155/59, pulse 61, temperature 97.6 F (36.4 C), temperature source Axillary, resp. rate (!) 21, height 4' 11 (1.499 m), weight 87 kg, SpO2 91%. Physical Exam Constitutional:      Comments: The patient is arousable to pain moving the right side spontaneously no movement of the left side.  Pupils are 5 mm on the right and 3 mm on the left.  Sluggishly reactive.  The patient does moan spontaneously at this time.     Assessment/Plan: Acute on chronic subdural hematoma with significant shift and mass effect in a patient who was anticoagulated on Coumadin with multiple medical issues.  A conservative course of action has been taken in regards to this patient and comfort measures will be applied.  I will sign off.  Victory PARAS Shwanda Soltis 10/21/2023, 10:00 AM

## 2023-10-21 NOTE — Progress Notes (Signed)
 Palliative:  Update: I returned to bedside after being called by daughter, Kreg. Kreg shares that her mother was beginning to awaken and she was trying to see if I could help explain our plan with her mother. Unfortunately Ms. Hewins is sleeping and difficult to arouse after receiving Valium . Ms. Belfield did begin to arouse a little but only to complain of more pain. Requested pain medication from RN. I discussed with Kreg that there may not be a good window of opportunity where her mother will be in a place to understand even if we did explain to her what is happening. I also explained that her mother would not likely remember this conversation either do to injury and medications required to keep her comfortable. Kreg will continue to assess for window of opportunity for discussion and educated that hospice staff can help her with this conversation if possible. I also encouraged Kreg to have acceptance that this may not be a possibility and she tells me that this will be okay too.   I helped Kreg to sign onto her email account to access hospice consent forms to sign. They have not been sent yet. RN to assist with computer access to sign consents when family notified they have been emailed. Updated RN on plans. Requested hospice liaison to notify Kreg when consent has been emailed so she knows to check.   30 additional min  Bernarda Kitty, NP Palliative Medicine Team Pager [phone] (Please see [email or web address] for schedule) Team Phone [phone]

## 2023-10-21 NOTE — Progress Notes (Signed)
 CLincal update provided to patient's daughter at bedside. Discussed the patient's imaging findings, level of function pre-injury, and desired outcomes. I shared that I do not believe that with this injury the patient will be able to return to independent level of function, though with maximal medical intervention, SNF placement MAY be possible depending on her clinical progression. Daughter is firm that anything other than independent function would not be in line with the patient's wishes. Given this, she elects to decline to proceed with the scheduled CT scan and wishes to transition to comfort care. Code status and orders updated. We discussed the options of comfort care in the hospital vs hospice facility. Given the patient's current stability and the daughter's geographic distance from home, I believe transition to a hospice facility would be in the best interest of all parties. She is open to exploring this. WIll engage TOC/Palliative for assistance.   Critical care time:  Dreama GEANNIE Hanger, MD General and Trauma Surgery [facility]

## 2023-10-21 NOTE — Consult Note (Signed)
 Consultation Note Date: 10/21/2023   Patient Name: Cynthia Raymond  DOB: [birth year]  MRN: [id number]  Age / Sex: 89 y.o., female  PCP: Alla Amis, MD Referring Physician: Md, Trauma, MD  Reason for Consultation: Hospice Evaluation  HPI/Patient Profile: 89 y.o. female  with past medical history of breast cancer, currently on palliative aromatase inhibitor therapy, history of PE on warfarin, CHF hypertension, osteoporosis  admitted on 10/20/2023 with fall with right subdural hematoma with worsening overnight now with midline shift.   Clinical Assessment and Goals of Care: Consult received and chart review completed. I discussed with RN prior to seeing. I met today with Cynthia Raymond (she is receiving pain medication at this time). I spoke with daughter, Cynthia Raymond, privately per request. Cynthia Raymond and I reviewed Cynthia Raymond chronic health issues and current health status. Cynthia Raymond shares that Cynthia Raymond has been having some decline at home but has still been able to live alone although with some concern and discussions around adding caregiver support at home. Although Cynthia Raymond has not been very receptive to having additional help at home.   Cynthia Raymond and I reviewed Cynthia Raymond's wishes. Cynthia Raymond does not believe Cynthia Raymond would be happy with QOL is unable to live at home and be mostly independent. Cynthia Raymond shares about Cynthia father's end of life after a fall and brain injury. Cynthia Raymond had to make decisions for Cynthia father because Cynthia Raymond was so distraught. Cynthia Raymond shares that Cynthia Raymond has anxiety at baseline and being ill and in the hospital only adds to this anxiety. She is moaning frequently at bedside. She did awaken this morning but awakened able to communicate needs but also with significant discomfort with pain and anxiety.   I discussed with Cynthia Raymond plans moving forward. We discussed comfort care and liberal as well  as scheduled medication for pain and anxiety to try and provide more relief for Cynthia Raymond. Cynthia Raymond agrees. Cynthia Raymond understands overall prognosis is poor. She would like to pursue hospice facility placement in [place]. She would like for Cynthia Raymond family (she has siblings) to be able to visit with Cynthia Raymond during this time and she is hopeful to get Cynthia back into [place] area sooner than later. We did talk about how to share with [name] Cynthia poor prognosis if she awakens enough to do so. Given the extent of Cynthia injury it is difficult to say if she will understand and if understanding she will likely not recall. Cynthia Raymond would like for me to try and discuss with Cynthia Raymond prior to transition to hospice and wants to see how hospice evaluation goes first.   All questions/concerns addressed. Emotional support provided. Discussed with RN and discussed later with Dr. Paola.   Primary Decision Maker NEXT OF KIN daughter Cynthia Raymond is reasonably available adult child    SUMMARY OF RECOMMENDATIONS   - DNR/DNI, no surgery, no feeding tube, no work up - Full comfort care - Liberalized medication for symptom control (currently struggling with pain and anxiety) - Hopeful for hospice facility  placement in [place] - await hospice evaluation  Code Status/Advance Care Planning: DNR   Symptom Management:  Pain: Poor relief from morphine 2 mg IV and fentanyl  25 mcg IV. She did rest well last night per report after a couple doses of fentanyl  - will increase fentanyl  dosage.  Anxiety: Valium  5 mg q8h and q4h PRN. Comfort feeds from floor stock if alert enough.   Prognosis:  Very poor with fall with brain injury and midline shift.  Discharge Planning: Hospice facility      Primary Diagnoses: Present on Admission:  TBI (traumatic brain injury) (HCC)   I have reviewed the medical record, interviewed the patient and family, and examined the patient. The following aspects are  pertinent.  Past Medical History:  Diagnosis Date   Cancer (HCC)    breast   Graves disease    Hypertension    Osteoporosis    Thyroid  disease    Social History   Socioeconomic History   Marital status: Widowed    Spouse name: Earle   Number of children: 4   Years of education: 12   Highest education level: Not on file  Occupational History   Not on file  Tobacco Use   Smoking status: Former    Current packs/day: 0.00    Types: Cigarettes    Quit date: 01/08/1987    Years since quitting: 36.8   Smokeless tobacco: Never   Tobacco comments:    Quit 1989  Substance and Sexual Activity   Alcohol use: No   Drug use: No   Sexual activity: Not on file  Other Topics Concern   Not on file  Social History Narrative   Patient is married to [place], has 4 children, 1 deceased   Patient is right handed   Education level is 12   Caffeine consumption is 2 cups daily         Social Drivers of Corporate investment banker Strain: Low Risk  (09/10/2023)   Received from [facility]   Overall Financial Resource Strain (CARDIA)    Difficulty of Paying Living Expenses: Not hard at all  Food Insecurity: No Food Insecurity (09/10/2023)   Received from [facility]   Hunger Vital Sign    Within the past 12 months, you worried that your food would run out before you got the money to buy more.: Never true    Within the past 12 months, the food you bought just didn't last and you didn't have money to get more.: Never true  Transportation Needs: No Transportation Needs (09/10/2023)   Received from [facility] - Transportation    In the past 12 months, has lack of transportation kept you from medical appointments or from getting medications?: No    Lack of Transportation (Non-Medical): No  Physical Activity: Unknown (04/08/2018)   Received from [facility]   Exercise Vital Sign    On average, how many days per week  do you engage in moderate to strenuous exercise (like a brisk walk)?: 0 days    Minutes of Exercise per Session: Not on file  Stress: No Stress Concern Present (04/08/2018)   Received from [facility] - Occupational Stress Questionnaire    Feeling of Stress : Only a little  Social Connections: Unknown (10/21/2023)   Social Connection and Isolation Panel    Frequency of Communication with Friends and Family: Not  on file    Frequency of Social Gatherings with Friends and Family: Not on file    Attends Religious Services: Not on file    Active Member of Clubs or Organizations: Not on file    Attends Club or Organization Meetings: Not on file    Marital Status: Widowed   Family History  Problem Relation Age of Onset   Breast cancer Raymond 55 - 37   Cancer Raymond    Heart attack Father    Breast cancer Sister 38 - 28   Cancer Sister    Heart disease Sister    Hyperlipidemia Sister    Heart attack Sister    Breast cancer Sister 72 - 61   Breast cancer Sister 75 - 65   Cancer Daughter    Cancer Brother    Scheduled Meds:  acetaminophen   1,000 mg Oral Q6H   Chlorhexidine Gluconate Cloth  6 each Topical Daily   methocarbamol (ROBAXIN) injection  500 mg Intravenous Q8H   mouth rinse  15 mL Mouth Rinse 4 times per day   Continuous Infusions: PRN Meds:.acetaminophen  **OR** acetaminophen , artificial tears, fentaNYL  (SUBLIMAZE ) injection, glycopyrrolate **OR** glycopyrrolate **OR** glycopyrrolate, morphine injection, ondansetron  **OR** ondansetron  (ZOFRAN ) IV, mouth rinse Allergies  Allergen Reactions   Penicillins Anaphylaxis and Shortness Of Breath   Gabapentin Other (See Comments) and Nausea And Vomiting   Morphine And Codeine Other (See Comments)    AMS   Sulfa Antibiotics Other (See Comments)    Tongue and lip swelling   Amitriptyline Other (See Comments)    Yeast infection   Buspirone Other (See Comments)    Increased  anxiety, agitation   Latex Rash    Pt states it is ok to use briefly   Morphine Other (See Comments)    Headache   Review of Systems  Unable to perform ROS: Acuity of condition    Physical Exam Vitals and nursing note reviewed.  Constitutional:      Appearance: She is ill-appearing.     Comments: Awakens to noise.   Cardiovascular:     Rate and Rhythm: Normal rate.  Pulmonary:     Effort: No tachypnea, accessory muscle usage or respiratory distress.  Abdominal:     Palpations: Abdomen is soft.  Neurological:     Mental Status: She is confused.     Vital Signs: BP (!) 155/59 (BP Location: Right Arm)   Pulse 61   Temp 97.6 F (36.4 C) (Axillary)   Resp (!) 21   Ht 4' 11 (1.499 m)   Wt 87 kg   SpO2 91%   BMI 38.74 kg/m  Pain Scale: CPOT   Pain Score: 10-Worst pain ever   SpO2: SpO2: 91 % O2 Device:SpO2: 91 % O2 Flow Rate: .O2 Flow Rate (L/min): 2 L/min  IO: Intake/output summary:  Intake/Output Summary (Last 24 hours) at 10/21/2023 1003 Last data filed at 10/21/2023 0600 Gross per 24 hour  Intake 135.08 ml  Output 0 ml  Net 135.08 ml    LBM: Last BM Date :  (PTA) Baseline Weight: Weight: 86.9 kg Most recent weight: Weight: 87 kg     Palliative Assessment/Data:    Time Total: 75 min  Greater than 50%  of this time was spent counseling and coordinating care related to the above assessment and plan.  Signed by: Bernarda Kitty, NP Palliative Medicine Team Pager # [phone] (M-F 8a-5p) Team Phone # [phone] (Nights/Weekends)

## 2023-10-21 NOTE — H&P (Addendum)
 HPI  Cynthia Raymond is an 89 y.o. female with extensive medical history including breast cancer, currently on palliative aromatase inhibitor therapy, history of PE on warfarin, CHF hypertension, osteoporosis who presents as a level 2 trauma following a fall on thinners.  Per daughter, who is at bedside, patient fell around 930 this evening.  Patient called her daughter immediately who subsequently called EMS.  Patient was then transported to our ED.  Per daughter, patient was initially oriented and alert and complaining and shouting about pain.  Per ED provider on arrival to the emergency department, she was agitated and shouting about her pain.  Workup was notable for significant TBI.  Neurosurgery was consulted who recommended repeat CT in 6 hours and stated this was a devastating injury with poor prognosis with or without surgery.  After I was consulted and came to evaluate patient, patient was very somnolent, difficult to arouse.  Would withdraw to pain occasionally and grimace to sternal rub but was not interactive.  On exam, her right pupil was larger than left, and after discussion with the ED provider, this was a new finding.  Given this, stat repeat head CT was obtained which showed worsening intra cranial bleed with worsening shift.  Patient's daughter states that she is DNR/DNI.  Complex family dynamics.  Daughter is the only active caregiver, with another daughter completely estranged and a third daughter with intermittent contact only.  Of note, patient's husband died several years ago from a TBI after a fall.  Patient was placed on cardene gtt for SBP goals < 200 by EDP, was quickly weaned off during my exam. Also had episode of emesis x1 small volume during my exam but appeared to have protected airway.  10 point review of systems is negative except as listed above in HPI.  Objective  Past Medical History: Past Medical History:  Diagnosis Date   Cancer (HCC)    breast    Graves disease    Hypertension    Osteoporosis    Thyroid  disease     Past Surgical History: Past Surgical History:  Procedure Laterality Date   BACK SURGERY     X3   BREAST BIOPSY Left 08/05/2023   US  LT BREAST BX W LOC DEV 1ST LESION IMG BX SPEC US  GUIDE 08/05/2023 [facility]   BREAST LUMPECTOMY Left    @ 2000   CATARACT EXTRACTION W/PHACO Left 02/18/2018   Procedure: CATARACT EXTRACTION PHACO AND INTRAOCULAR LENS PLACEMENT (IOC) LEFT;  Surgeon: Mittie Gaskin, MD;  Location: [facility];  Service: Ophthalmology;  Laterality: Left;   CATARACT EXTRACTION W/PHACO Right 03/17/2018   Procedure: CATARACT EXTRACTION PHACO AND INTRAOCULAR LENS PLACEMENT (IOC)  RIGHT;  Surgeon: Mittie Gaskin, MD;  Location: [facility];  Service: Ophthalmology;  Laterality: Right;   ELBOW SURGERY Right    HAND SURGERY Right    JOINT REPLACEMENT Bilateral    Both knees, left shoulder   ROTATOR CUFF REPAIR Bilateral     Family History:  Family History  Problem Relation Age of Onset   Breast cancer Mother 21 - 62   Cancer Mother    Heart attack Father    Breast cancer Sister 90 - 1   Cancer Sister    Heart disease Sister    Hyperlipidemia Sister    Heart attack Sister    Breast cancer Sister 26 - 65   Breast cancer Sister 12 - 34   Cancer Daughter    Cancer Brother  Social History:  reports that she quit smoking about 36 years ago. Her smoking use included cigarettes. She has never used smokeless tobacco. She reports that she does not drink alcohol and does not use drugs.  Allergies:  Allergies  Allergen Reactions   Penicillins Anaphylaxis and Shortness Of Breath   Gabapentin Other (See Comments) and Nausea And Vomiting   Morphine And Codeine Other (See Comments)    AMS   Sulfa Antibiotics Other (See Comments)    Tongue and lip swelling   Amitriptyline Other (See Comments)    Yeast infection   Buspirone Other (See Comments)    Increased anxiety,  agitation   Latex Rash    Pt states it is ok to use briefly   Morphine Other (See Comments)    Headache    Medications: I have reviewed the patient's current medications.  Labs: I have personally reviewed all labs for the past 24h  Imaging: I have personally reviewed and interpreted all imaging for the past 24h and agree with the radiologist's impression.  CT HEAD WO CONTRAST ( ) Result Date: 10/20/2023 EXAM: CT HEAD WITHOUT CONTRAST 10/20/2023 11:48:41 PM TECHNIQUE: CT of the head was performed without the administration of intravenous contrast. Automated exposure control, iterative reconstruction, and/or weight based adjustment of the mA/kV was utilized to reduce the radiation dose to as low as reasonably achievable. COMPARISON: None available. CLINICAL HISTORY: Head trauma, minor (Age >= 65y). Fall, polytrauma, blunt. FINDINGS: BRAIN AND VENTRICLES: Acute 20mm on chronic 12mm extraxial hematoma - likely subdural - along the right frontotemporal calvarial convexity. Associated 8 mm right to left midline shift. 3mm right parafalcine acute subdural hematoma. Effacement of some of the cerebral sulci along the frontal and temporal lobes. Possible associated trace right subarachnoid hemorrhage. No evidence of acute infarct. No hydrocephalus. ORBITS: No acute abnormality. SINUSES: No acute abnormality. SOFT TISSUES AND SKULL: No acute soft tissue abnormality. No skull fracture. IMPRESSION: 1. Acute on chronic 20 mm right frontotemporal extra-axial hematoma with 8 mm right-to-left midline shift. Hematoma is likely subdural in etiology; although, possible superimposed acute epidural component is difficult to exclude and a short-term interval follow-up to ensure it is not expanding is suggested. 2. Associated 3mm right parafalcine acute subdural hematoma. 3. Possible trace right subarachnoid hemorrhage. Findings discussed over the phone with doctor Countryman by doctor Naveau on 10/20/2023 at 11:49 pm.  Electronically signed by: Morgane Naveau MD 10/20/2023 11:59 PM EDT RP Workstation: [id number]   CT CHEST ABDOMEN PELVIS W CONTRAST Result Date: 10/20/2023 EXAM: CT CHEST, ABDOMEN AND PELVIS WITH CONTRAST 10/20/2023 11:48:41 PM TECHNIQUE: CT of the chest, abdomen and pelvis was performed with the administration of 75 mL of iohexol  (OMNIPAQUE ) 350 MG/ML injection. Multiplanar reformatted images are provided for review. Automated exposure control, iterative reconstruction, and/or weight based adjustment of the mA/kV was utilized to reduce the radiation dose to as low as reasonably achievable. COMPARISON: None available. CLINICAL HISTORY: Polytrauma, blunt. FINDINGS: CHEST: MEDIASTINUM AND LYMPH NODES: The heart is enlarged. There are atherosclerotic calcifications of the aorta and coronary arteries. The central airways are clear. No mediastinal hematoma or pneumomediastinum. No mediastinal, hilar or axillary lymphadenopathy. LUNGS AND PLEURA: No focal consolidation or pulmonary edema. No pleural effusion or pneumothorax. ABDOMEN AND PELVIS: LIVER: The liver is unremarkable. GALLBLADDER AND BILE DUCTS: The gallbladder is surgically absent. No biliary ductal dilatation. SPLEEN: Rounded subcentimeter hypodensities in the spleen appear unchanged. Spleen is normal in size. PANCREAS: No acute abnormality. ADRENAL GLANDS: No acute abnormality. KIDNEYS, URETERS AND BLADDER:  No stones in the kidneys or ureters. No hydronephrosis. No perinephric or periureteral stranding. Urinary bladder is unremarkable. GI AND BOWEL: Stomach demonstrates no acute abnormality. There is no bowel obstruction. There is sigmoid colon diverticulosis. The appendix is normal. REPRODUCTIVE ORGANS: No acute abnormality. PERITONEUM AND RETROPERITONEUM: No ascites. No free air. VASCULATURE: Aorta is normal in caliber. There are atherosclerotic calcifications of the aorta. ABDOMINAL AND PELVIS LYMPH NODES: No lymphadenopathy. BONES AND SOFT TISSUES:  The bones are osteopenic. No acute fractures are seen. Degenerative changes are seen throughout the spine. Lumbar fusion hardware is noted at L3-L4. There are postsurgical changes in both shoulders and the right hip. No focal soft tissue abnormality. IMPRESSION: 1. No acute traumatic injury in the chest, abdomen, or pelvis. 2. Cardiomegaly. 3. Aortic and coronary artery atherosclerosis. 4. Colonic diverticulosis. Electronically signed by: Greig Pique MD 10/20/2023 11:58 PM EDT RP Workstation: [id number]   CT CERVICAL SPINE WO CONTRAST Result Date: 10/20/2023 EXAM: CT CERVICAL SPINE WITH CONTRAST 10/20/2023 11:48:41 PM TECHNIQUE: CT of the cervical spine was performed with the administration of intravenous contrast (iohexol  (OMNIPAQUE ) 350 MG/ML injection 75 mL). Multiplanar reformatted images are provided for review. Automated exposure control, iterative reconstruction, and/or weight based adjustment of the mA/kV was utilized to reduce the radiation dose to as low as reasonably achievable. COMPARISON: None available. CLINICAL HISTORY: Polytrauma, blunt. Fall, polytrauma, blunt. FINDINGS: CERVICAL SPINE: BONES AND ALIGNMENT: No acute fracture or traumatic malalignment. DEGENERATIVE CHANGES: Mild degenerative changes with bulky anterior osteophytosis of the mid cervical spine. SOFT TISSUES: No prevertebral soft tissue swelling. IMPRESSION: 1. No acute abnormality of the cervical spine. Electronically signed by: Pinkie Pebbles MD 10/20/2023 11:52 PM EDT RP Workstation: [id number]   DG Pelvis Portable Result Date: 10/20/2023 EXAM: 1 or 2 VIEW(S) XRAY OF THE PELVIS 10/20/2023 11:35:00 PM COMPARISON: CT abdomen and pelvis 01/27/23. CLINICAL HISTORY: fall. Table formatting from the original note was not included.; Images from the original note were not included.; Pt BIB EMS from home due to mechanical fall, no LOC. Pt on Warfarin. GCS 15. Pt w/ hematoma to the rt eye/forehead. Pt reports 10/10 rt hip and elbow  pain. C-collar in place. FINDINGS: BONES AND JOINTS: Partially visualized total right hip arthroplasty. No acute displaced fracture or dislocation of the hips. No acute displaced fracture or diastasis of the bones of the pelvis. The sacrum is grossly unremarkable - limited evaluation due to overlying osseous structures and soft tissues. Visualized lower lumbar spine demonstrates degenerative changes as well as L3-L4 surgical hardware. SOFT TISSUES: The soft tissues are unremarkable. IMPRESSION: 1. No acute traumatic injury. Limited evaluation due to overlying osseous structures and soft tissues. Electronically signed by: Morgane Naveau MD 10/20/2023 11:44 PM EDT RP Workstation: [id number]   DG Chest Portable 1 View Result Date: 10/20/2023 EXAM: 1 VIEW(S) XRAY OF THE CHEST 10/20/2023 11:35:00 PM COMPARISON: Chest x-ray 01/27/2023. CT chest 01/27/23 CLINICAL HISTORY: Fall. Pt on Warfarin. GCS 15. Pt w/ hematoma to the rt eye/forehead. Pt reports 10/10 rt hip and elbow pain. FINDINGS: LUNGS AND PLEURA: Chronic coarsened interstitial markings with no overt pulmonary edema. No focal pulmonary opacity. No pleural effusion. No pneumothorax. HEART AND MEDIASTINUM: Atherosclerotic plaque of the aorta. Tortuous aorta. Coronary artery calcification. BONES AND SOFT TISSUES: Anchor sutures overlie the left shoulder. Reverse total right shoulder arthroplasty partially visualized. IMPRESSION: 1. No acute cardiopulmonary abnormality related to the fall. Electronically signed by: Morgane Naveau MD 10/20/2023 11:40 PM EDT RP Workstation: [id number]     Physical Exam Blood pressure ROLLEN)  210/87, pulse 62, temperature (!) 97.3 F (36.3 C), temperature source Oral, resp. rate 14, height 4' 11 (1.499 m), weight 86.9 kg, SpO2 96%. General: somnolent, no acute distress HEENT: small superficial laceration to right lateral eyelid, steri strips placed, periorbital ecchymosis and swelling on right. R pupil > L, both  reactive. Oropharynx: mucous membranes dry CV: Sinus bradycardia, hypertensive Chest: equal chest rise bilaterally normal respiratory effort on room air Abdomen: soft and nontender Back: no step offs or deformities Extremities: will move all extremities to pain, BLE edema Skin: warm, dry, no rashes Psych: unable to assess Neuro: GCS 7 E1V2M4, minimal mumbling with sternal rub, withdraws to pain    Assessment   Tamiko Leopard is an 89 y.o. female with right subdural hematoma with right to left midline shift of 1.5 cm.  Plan  - Will admit to trauma service, ICU - I discussed extensively with daughter goals of care.  We discussed comfort measures only, daughter is considering this but would like the night to process before making any transition to comfort care.  Reiterated in our discussion that if patient were to have cardiac arrest, no CPR or ACLS medications or defibrillation are to be performed.  If patient were to have respiratory failure, she is not to be intubated and will not intubate for depressed GCS. I discussed with daughter that I am not sure how she will do the remainder of the night given rapid deterioration in short interval of CT scans and difficult to predict if her TBI will continue to rapidly worsen, versus stabilize and what that means in terms of sequela over the next few hours. - NPO - Will hold off on IV fluids at this time given history of congestive heart failure and patient is on Lasix at home - Appreciate neurosurgery recommendations. No surgical intervention as discussed with Dr. Colon - Keppra for seizure prophylaxis while continuing full medical support - Will tentatively order repeat head CT for 8am - DVT - SCDs, hold chemical ppx due to bleeding concerns - Dispo - ICU   I reviewed ED provider notes, last 24 h vitals and pain scores, last 48 h intake and output, last 24 h labs and trends, and last 24 h imaging results.  This required 120 minutes of  critical care time in both face-to-face and non-face-to-face activities, excluding procedures performed, for this visit on the date of this encounter. I personally reviewed all labs and imaging. I discussed plan of care with Dr. Jerral, EDP and Dr. Colon with neurosurgery.SABRA Orie Silversmith, MD [facility]

## 2023-10-21 NOTE — TOC CAGE-AID Note (Signed)
 Transition of Care [facility]) - CAGE-AID Screening   Patient Details  Name: Cynthia Raymond MRN: [id number] Date of Birth: 05-Sep-1929  Transition of [facility]) CM/SW Contact:    LEBRON ROCKIE ORN, RN Phone Number: [phone] 10/21/2023, 1:06 PM   Clinical Narrative: Pt currently in hospital after sustaining a devastating head bleed due to a fall last night. Pt unable to participate in screening due to decreased mental status.  Pt is now a DNR/comfort care.     CAGE-AID Screening: Substance Abuse Screening unable to be completed due to: : Patient unable to participate

## 2023-10-21 NOTE — TOC CAGE-AID Note (Signed)
 Transition of Care [facility]) - CAGE-AID Screening   Patient Details  Name: Cynthia Raymond MRN: [id number] Date of Birth: 1929-10-20  Transition of Care [facility]) CM/SW Contact:    Camran Keady E Kalley Nicholl, LCSW Phone Number: 10/21/2023, 10:49 AM   Clinical Narrative:    CAGE-AID Screening: Substance Abuse Screening unable to be completed due to: :  (N/A)

## 2023-10-21 NOTE — Discharge Summary (Signed)
 Physician Discharge Summary  Patient ID: Cynthia Raymond MRN: [id number] DOB/AGE: 1929-03-11 89 y.o.  Admit date: 10/20/2023 Discharge date: 10/21/2023  Discharge Diagnoses Ground level fall  Breast cancer on palliative chemotherapy  Hx of PE on warfarin  CHF HTN Osteoporosis  SDH with midline shift  Consultants Neurosurgery  Palliative care  Procedures None   HPI: Patient is an 89 y.o. female with extensive medical history including breast cancer, currently on palliative aromatase inhibitor therapy, history of PE on warfarin, CHF hypertension, osteoporosis who presented as a level 2 trauma following a fall on thinners.   Per daughter, patient fell around 9:30 PM on evening of presentation.  Patient called her daughter immediately who subsequently called EMS.  Patient was then transported to our ED.  Per daughter, patient was initially oriented and alert and complaining and shouting about pain.  Per ED provider on arrival to the emergency department, she was agitated and shouting about her pain.  Workup was notable for significant TBI.  Neurosurgery was consulted who recommended repeat CT in 6 hours and stated this was a devastating injury with poor prognosis with or without surgery.   After trauma was consulted and came to evaluate patient, patient was very somnolent, difficult to arouse. Would withdraw to pain occasionally and grimace to sternal rub but was not interactive. On exam, her right pupil was larger than left, and after discussion with the ED provider, this was a new finding. Given this, STAT repeat head CT was obtained which showed worsening intra cranial bleed with worsening shift.   Patient's daughter stated that she is DNR/DNI. Complex family dynamics.  Daughter is the only active caregiver, with another daughter completely estranged and a third daughter with intermittent contact only.  Of note, patient's husband died several years ago from a TBI after a  fall.  Hospital Course: Patient admitted to trauma ICU. Discussion regarding GOC had early 10/14 and patient's daughter reported that patient would not want to be dependent on anyone for care. Elected to make patient comfort care and agreeable to discuss possible residential hospice with palliative care. Palliative consulted. Decision made to proceed to residential hospice. Daughter elected to pursue Hospice Home in [place] and patient accepted on 10/21/23. Discharged to hospice.     Allergies as of 10/21/2023       Reactions   Penicillins Anaphylaxis, Shortness Of Breath   Gabapentin Other (See Comments), Nausea And Vomiting   Morphine And Codeine Other (See Comments)   AMS   Sulfa Antibiotics Other (See Comments)   Tongue and lip swelling   Amitriptyline Other (See Comments)   Yeast infection   Buspirone Other (See Comments)   Increased anxiety, agitation   Latex Rash   Pt states it is ok to use briefly   Morphine Other (See Comments)   Headache        Medication List     STOP taking these medications    anastrozole  1 MG tablet Commonly known as: ARIMIDEX    furosemide 40 MG tablet Commonly known as: LASIX   Jardiance 10 MG Tabs tablet Generic drug: empagliflozin   levothyroxine  112 MCG tablet Commonly known as: SYNTHROID    Melatonin 3 MG Caps   pantoprazole 40 MG tablet Commonly known as: PROTONIX   ramipril  10 MG capsule Commonly known as: ALTACE    traZODone 100 MG tablet Commonly known as: DESYREL   venlafaxine  XR 75 MG 24 hr capsule Commonly known as: EFFEXOR -XR   Vitamin D (Ergocalciferol) 1.25 MG (50000 UNIT)  Caps capsule Commonly known as: DRISDOL   warfarin 5 MG tablet Commonly known as: COUMADIN       TAKE these medications    acetaminophen  325 MG tablet Commonly known as: TYLENOL  Take 2 tablets (650 mg total) by mouth every 6 (six) hours as needed for fever (Fever >/= 101). What changed:  medication strength how much to  take when to take this reasons to take this   acetaminophen  650 MG suppository Commonly known as: TYLENOL  Place 1 suppository (650 mg total) rectally every 6 (six) hours as needed for fever (Fever >/= 101). What changed: You were already taking a medication with the same name, and this prescription was added. Make sure you understand how and when to take each.   artificial tears ophthalmic solution Place 1 drop into both eyes 4 (four) times daily as needed for dry eyes.   diazepam  5 MG/ML injection Commonly known as: VALIUM  Inject 1 mL (5 mg total) into the vein every 8 (eight) hours. May also inject 1 mL (5 mg total) every 4 (four) hours as needed.   fentaNYL  50 MCG/ML Sosy injection Commonly known as: SUBLIMAZE  Inject 1-2 mLs (50-100 mcg total) into the vein every hour as needed for moderate pain (pain score 4-6).   glycopyrrolate 1 MG tablet Commonly known as: ROBINUL Take 1 tablet (1 mg total) by mouth every 4 (four) hours as needed (excessive secretions).   glycopyrrolate 0.2 MG/ML injection Commonly known as: ROBINUL Inject 1 mL (0.2 mg total) into the skin every 4 (four) hours as needed (excessive secretions).   glycopyrrolate 0.2 MG/ML injection Commonly known as: ROBINUL Inject 1 mL (0.2 mg total) into the vein every 4 (four) hours as needed (excessive secretions).   methocarbamol 1000 MG/10ML injection Commonly known as: ROBAXIN Inject 5 mLs (500 mg total) into the vein every 8 (eight) hours.   morphine (PF) 2 MG/ML injection Inject 1-2 mLs (2-4 mg total) into the vein every 30 (thirty) minutes as needed (severe pain, dyspnea; if not achieving comfort, call MD for infusion orders).   mouth rinse Liqd solution 15 mLs by Mouth Rinse route as needed (for oral care).   ondansetron  4 MG disintegrating tablet Commonly known as: ZOFRAN -ODT Take 1 tablet (4 mg total) by mouth every 6 (six) hours as needed for nausea.           Signed: Burnard JONELLE Louder ,  [facility] Surgery 10/21/2023, 2:22 PM Please see Amion for pager number during day hours 7:00am-4:30pm

## 2023-10-21 NOTE — TOC Initial Note (Signed)
 Transition of Care [facility]) - Initial/Assessment Note    Patient Details  Name: Cynthia Raymond MRN: [id number] Date of Birth: 14-Jul-1929  [facility]) CM/SW Contact:    Anilah Huck E Aulden Calise, LCSW Phone Number: 10/21/2023, 10:53 AM  Clinical Narrative:                 CSW spoke with patient's daughter Kreg. Kreg states she just spoke with Palliative and would like to pursue Hospice Home in [place]. CSW reached out to [facility] Representative Melissa to start referral process.  Expected Discharge Plan: Hospice Medical Facility Barriers to Discharge: Continued Medical Work up   Patient Goals and CMS Choice Patient states their goals for this hospitalization and ongoing recovery are:: hospice home in [facility].gov Compare Post Acute Care list provided to:: Patient Represenative (must comment) Choice offered to / list presented to : Adult Children      Expected Discharge Plan and Services       Living arrangements for the past 2 months: Single Family Home                                      Prior Living Arrangements/Services Living arrangements for the past 2 months: Single Family Home   Patient language and need for interpreter reviewed:: Yes Do you feel safe going back to the place where you live?: Yes      Need for Family Participation in Patient Care: Yes (Comment) Care giver support system in place?: Yes (comment)   Criminal Activity/Legal Involvement Pertinent to Current Situation/Hospitalization: No - Comment as needed  Activities of Daily Living      Permission Sought/Granted Permission sought to share information with : Facility Industrial/product designer granted to share information with : Yes, Verbal Permission Granted (by daughter)              Emotional Assessment         Alcohol / Substance Use: Not Applicable Psych Involvement: No (comment)  Admission diagnosis:  TBI (traumatic brain injury)  (HCC) [S06.9XAA] Patient Active Problem List   Diagnosis Date Noted   TBI (traumatic brain injury) (HCC) 10/21/2023   Invasive ductal carcinoma of breast, female, left (HCC) 08/13/2023   Osteopenia 08/13/2023   Cardiomyopathy (HCC) 08/13/2023   Family history of cancer 08/13/2023   Primary hypertension 01/28/2023   Acquired hypothyroidism 01/28/2023   Depression 01/28/2023   PE (pulmonary thromboembolism) (HCC) 01/27/2023   Chronic pain syndrome 05/25/2020   Primary osteoarthritis of left shoulder 05/25/2020   Lumbar degenerative disc disease 05/25/2020   Morbid obesity with BMI of 40.0-44.9, adult (HCC) 11/20/2017   S/P carpal tunnel release 05/28/2017   Radiculopathy of cervical region 03/05/2017   Status post right hip replacement 05/23/2016   Stage 3a chronic kidney disease (HCC) 05/09/2016   Carpal tunnel syndrome of left wrist 06/03/2014   Chronic right shoulder pain 06/18/2013   Aortic atherosclerosis 02/12/2013   Abdominal pain, chronic, right lower quadrant 02/12/2013   Weakness of limb 02/12/2013   Spinal stenosis, lumbar region, with neurogenic claudication 09/07/2010   PCP:  Alla Amis, MD Pharmacy:   [id number] - [place], [place] - [address] [place] [address] Phone: [phone] Fax: [phone]  [facility] - [place], [place] - [address] [place] [address] Phone: [phone] Fax: [phone]  [facility] [place]  [facility] [address] [place] [address] Phone: [phone] Fax: [phone]     Social Drivers of Health (SDOH) Social History: SDOH Screenings   Food Insecurity: No Food Insecurity (09/10/2023)   Received from [facility]  Housing: Low Risk  (09/10/2023)   Received from [facility]  Transportation Needs: No Transportation Needs (09/10/2023)   Received from [facility]  Utilities: Not At Risk (09/10/2023)    Received from [facility]  Depression [phone]): Low Risk  (08/20/2023)  Financial Resource Strain: Low Risk  (09/10/2023)   Received from [facility]  Physical Activity: Unknown (04/08/2018)   Received from [facility]  Social Connections: Unknown (10/21/2023)  Stress: No Stress Concern Present (04/08/2018)   Received from [facility]  Tobacco Use: Medium Risk (09/23/2023)   SDOH Interventions:     Readmission Risk Interventions     No data to display

## 2023-10-21 NOTE — Progress Notes (Signed)
 [facility] [phone] [facility] Liaison Note  Received request from [facility] manager for family interest in [facility] in [place]. Eligibility confirmed. Met with patient and family to confirm interest and explain services. Family agreeable to transfer today. TOC aware. RN please call report to [phone] prior to patient leaving the unit. Please send signed DNR with patient at discharge.   Thank you for allowing us  to participate in this patient's care.  Eleanor Nail, LPN [facility] Liaison [phone]

## 2023-10-21 NOTE — TOC CM/SW Note (Signed)
 Transition of Care (TOC) CM/SW Note    ICM consult for hospice facility placement. CSW attempted call to daughter Kreg, left a VM requesting a return call.   Karlisha Mathena, [place] [phone]

## 2023-10-21 NOTE — ED Provider Notes (Signed)
 Patient's daughter arrived.  She stated that the patient has a signed DO NOT RESUSCITATE form at home.  She would not want to be on a ventilator and does not want aggressive measures of resuscitation if she were to expire.  Does want continued medical care at this time though family expressed interest in palliative scope of care if patient was to worsen overnight.   Jerral Meth, MD 10/21/23 [phone]

## 2023-10-21 NOTE — TOC Transition Note (Signed)
 Transition of Care [facility]) - Discharge Note   Patient Details  Name: Cynthia Raymond MRN: [id number] Date of Birth: 1929/07/10  Transition [facility] Care [facility]) CM/SW Contact:  Issaiah Seabrooks E San Lohmeyer, LCSW Phone Number: 10/21/2023, 3:58 PM   Clinical Narrative:    Patient is discharging to [facility] in [place], [place]. Daughter Kreg is aware.  EMS paperwork completed. Patient added to list for PTAR.    Final next level of care: Hospice Medical Facility Barriers to Discharge: Barriers Resolved   Patient Goals and CMS Choice Patient states their goals for this hospitalization and ongoing recovery are:: hospice home in [facility].gov Compare Post Acute Care list provided to:: Patient Represenative (must comment) Choice offered to / list presented to : Adult Children      Discharge Placement                    Patient and family notified of of transfer: 10/21/23  Discharge Plan and Services Additional resources added to the After Visit Summary for                                       Social Drivers of Health (SDOH) Interventions SDOH Screenings   Food Insecurity: No Food Insecurity (09/10/2023)   [facility]  Housing: Low Risk  (09/10/2023)   [facility] from [facility]  Transportation Needs: No Transportation Needs (09/10/2023)   Received from [facility]  Utilities: Not At Risk (09/10/2023)   Received from [facility]  Depression [phone]): Low Risk  (08/20/2023)  Financial Resource Strain: Low Risk  (09/10/2023)   Received from [facility]  Physical Activity: Unknown (04/08/2018)   Received from [facility]  Social Connections: Unknown (10/21/2023)  Stress: No Stress Concern Present (04/08/2018)   Received from [facility]  Tobacco Use: Medium Risk (09/23/2023)     Readmission Risk Interventions     No  data to display

## 2023-10-21 NOTE — Progress Notes (Signed)
 Orthopedic Tech Progress Note Patient Details:  Cynthia Raymond 1929/01/16 [id number]  Patient ID: Cynthia Raymond, female   DOB: [birth year], 89 y.o.   MRN: [id number] LV2T FOT. No orders at this time. Ronin Rehfeldt L Elloise Roark 10/21/2023, 12:19 AM

## 2023-11-08 DEATH — deceased

## 2023-11-26 ENCOUNTER — Ambulatory Visit: Admitting: Oncology

## 2023-11-26 ENCOUNTER — Other Ambulatory Visit

## 2023-11-26 ENCOUNTER — Encounter: Payer: Self-pay | Admitting: *Deleted
